# Patient Record
Sex: Female | Born: 1965 | Race: Black or African American | Hispanic: No | Marital: Single | Smoking: Current every day smoker
Health system: Southern US, Community
[De-identification: ages and names within clinical notes are randomized; demographics above are authoritative.]

## PROBLEM LIST (undated history)

## (undated) DIAGNOSIS — I10 Essential (primary) hypertension: Secondary | ICD-10-CM

## (undated) DIAGNOSIS — F32A Depression, unspecified: Secondary | ICD-10-CM

## (undated) DIAGNOSIS — I219 Acute myocardial infarction, unspecified: Secondary | ICD-10-CM

## (undated) DIAGNOSIS — G8929 Other chronic pain: Secondary | ICD-10-CM

## (undated) DIAGNOSIS — M549 Dorsalgia, unspecified: Secondary | ICD-10-CM

## (undated) DIAGNOSIS — C170 Malignant neoplasm of duodenum: Secondary | ICD-10-CM

## (undated) DIAGNOSIS — R569 Unspecified convulsions: Secondary | ICD-10-CM

## (undated) DIAGNOSIS — F1010 Alcohol abuse, uncomplicated: Secondary | ICD-10-CM

## (undated) DIAGNOSIS — R51 Headache: Secondary | ICD-10-CM

## (undated) DIAGNOSIS — R519 Headache, unspecified: Secondary | ICD-10-CM

## (undated) DIAGNOSIS — M1990 Unspecified osteoarthritis, unspecified site: Secondary | ICD-10-CM

## (undated) DIAGNOSIS — I639 Cerebral infarction, unspecified: Secondary | ICD-10-CM

## (undated) DIAGNOSIS — F329 Major depressive disorder, single episode, unspecified: Secondary | ICD-10-CM

## (undated) HISTORY — PX: CARDIAC CATHETERIZATION: SHX172

## (undated) HISTORY — DX: Essential (primary) hypertension: I10

## (undated) HISTORY — DX: Major depressive disorder, single episode, unspecified: F32.9

## (undated) HISTORY — DX: Unspecified convulsions: R56.9

## (undated) HISTORY — DX: Depression, unspecified: F32.A

## (undated) HISTORY — PX: COLPOSCOPY VULVA W/ BIOPSY: SUR282

---
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
## 1989-05-27 DIAGNOSIS — I639 Cerebral infarction, unspecified: Secondary | ICD-10-CM

## 1989-05-27 HISTORY — DX: Cerebral infarction, unspecified: I63.9

## 1998-01-01 ENCOUNTER — Encounter: Admission: RE | Admit: 1998-01-01 | Discharge: 1998-01-01 | Payer: Self-pay | Admitting: Internal Medicine

## 1998-01-20 ENCOUNTER — Encounter: Admission: RE | Admit: 1998-01-20 | Discharge: 1998-01-20 | Payer: Self-pay | Admitting: Internal Medicine

## 1998-02-25 ENCOUNTER — Encounter: Admission: RE | Admit: 1998-02-25 | Discharge: 1998-02-25 | Payer: Self-pay | Admitting: Hematology and Oncology

## 1998-05-19 ENCOUNTER — Encounter: Admission: RE | Admit: 1998-05-19 | Discharge: 1998-05-19 | Payer: Self-pay | Admitting: Internal Medicine

## 1998-07-02 ENCOUNTER — Encounter: Admission: RE | Admit: 1998-07-02 | Discharge: 1998-07-02 | Payer: Self-pay | Admitting: Internal Medicine

## 1998-07-02 ENCOUNTER — Ambulatory Visit (HOSPITAL_COMMUNITY): Admission: RE | Admit: 1998-07-02 | Discharge: 1998-07-02 | Payer: Self-pay | Admitting: Internal Medicine

## 1998-07-29 ENCOUNTER — Encounter: Admission: RE | Admit: 1998-07-29 | Discharge: 1998-07-29 | Payer: Self-pay | Admitting: Internal Medicine

## 1998-08-18 ENCOUNTER — Encounter: Admission: RE | Admit: 1998-08-18 | Discharge: 1998-08-18 | Payer: Self-pay | Admitting: Internal Medicine

## 1998-09-30 ENCOUNTER — Encounter: Payer: Self-pay | Admitting: Emergency Medicine

## 1998-09-30 ENCOUNTER — Inpatient Hospital Stay (HOSPITAL_COMMUNITY): Admission: EM | Admit: 1998-09-30 | Discharge: 1998-10-02 | Payer: Self-pay | Admitting: Emergency Medicine

## 1998-11-06 ENCOUNTER — Encounter: Admission: RE | Admit: 1998-11-06 | Discharge: 1998-11-06 | Payer: Self-pay | Admitting: Internal Medicine

## 1999-01-19 ENCOUNTER — Emergency Department (HOSPITAL_COMMUNITY): Admission: EM | Admit: 1999-01-19 | Discharge: 1999-01-19 | Payer: Self-pay | Admitting: Emergency Medicine

## 1999-01-19 ENCOUNTER — Encounter: Admission: RE | Admit: 1999-01-19 | Discharge: 1999-01-19 | Payer: Self-pay | Admitting: Hematology and Oncology

## 1999-01-31 ENCOUNTER — Emergency Department (HOSPITAL_COMMUNITY): Admission: EM | Admit: 1999-01-31 | Discharge: 1999-02-01 | Payer: Self-pay | Admitting: Emergency Medicine

## 1999-02-01 ENCOUNTER — Encounter: Payer: Self-pay | Admitting: Emergency Medicine

## 1999-02-16 ENCOUNTER — Ambulatory Visit (HOSPITAL_COMMUNITY): Admission: RE | Admit: 1999-02-16 | Discharge: 1999-02-16 | Payer: Self-pay | Admitting: *Deleted

## 1999-02-22 ENCOUNTER — Encounter: Admission: RE | Admit: 1999-02-22 | Discharge: 1999-02-22 | Payer: Self-pay | Admitting: Internal Medicine

## 1999-02-25 ENCOUNTER — Ambulatory Visit (HOSPITAL_COMMUNITY): Admission: RE | Admit: 1999-02-25 | Discharge: 1999-02-25 | Payer: Self-pay | Admitting: *Deleted

## 1999-03-29 ENCOUNTER — Encounter: Admission: RE | Admit: 1999-03-29 | Discharge: 1999-03-29 | Payer: Self-pay | Admitting: Internal Medicine

## 1999-04-23 ENCOUNTER — Encounter: Admission: RE | Admit: 1999-04-23 | Discharge: 1999-04-23 | Payer: Self-pay | Admitting: Internal Medicine

## 1999-05-24 ENCOUNTER — Encounter: Admission: RE | Admit: 1999-05-24 | Discharge: 1999-05-24 | Payer: Self-pay | Admitting: Internal Medicine

## 1999-06-21 ENCOUNTER — Ambulatory Visit (HOSPITAL_COMMUNITY): Admission: RE | Admit: 1999-06-21 | Discharge: 1999-06-21 | Payer: Self-pay | Admitting: Orthopedic Surgery

## 1999-06-21 ENCOUNTER — Encounter: Payer: Self-pay | Admitting: Orthopedic Surgery

## 1999-08-23 ENCOUNTER — Encounter: Admission: RE | Admit: 1999-08-23 | Discharge: 1999-08-23 | Payer: Self-pay | Admitting: Internal Medicine

## 1999-08-31 ENCOUNTER — Emergency Department (HOSPITAL_COMMUNITY): Admission: EM | Admit: 1999-08-31 | Discharge: 1999-08-31 | Payer: Self-pay | Admitting: Emergency Medicine

## 1999-09-02 ENCOUNTER — Encounter: Admission: RE | Admit: 1999-09-02 | Discharge: 1999-09-02 | Payer: Self-pay | Admitting: Psychiatry

## 1999-09-09 ENCOUNTER — Encounter: Payer: Self-pay | Admitting: *Deleted

## 1999-09-09 ENCOUNTER — Ambulatory Visit (HOSPITAL_COMMUNITY): Admission: RE | Admit: 1999-09-09 | Discharge: 1999-09-09 | Payer: Self-pay

## 1999-10-18 ENCOUNTER — Ambulatory Visit (HOSPITAL_BASED_OUTPATIENT_CLINIC_OR_DEPARTMENT_OTHER): Admission: RE | Admit: 1999-10-18 | Discharge: 1999-10-18 | Payer: Self-pay | Admitting: Surgery

## 2000-03-30 ENCOUNTER — Encounter: Admission: RE | Admit: 2000-03-30 | Discharge: 2000-03-30 | Payer: Self-pay | Admitting: Hematology and Oncology

## 2000-08-05 ENCOUNTER — Emergency Department (HOSPITAL_COMMUNITY): Admission: EM | Admit: 2000-08-05 | Discharge: 2000-08-05 | Payer: Self-pay | Admitting: Emergency Medicine

## 2000-08-05 ENCOUNTER — Encounter: Payer: Self-pay | Admitting: Emergency Medicine

## 2000-09-01 ENCOUNTER — Encounter: Admission: RE | Admit: 2000-09-01 | Discharge: 2000-09-01 | Payer: Self-pay | Admitting: Internal Medicine

## 2001-04-09 ENCOUNTER — Encounter: Admission: RE | Admit: 2001-04-09 | Discharge: 2001-04-09 | Payer: Self-pay | Admitting: *Deleted

## 2001-10-23 ENCOUNTER — Encounter (INDEPENDENT_AMBULATORY_CARE_PROVIDER_SITE_OTHER): Payer: Self-pay | Admitting: *Deleted

## 2001-10-23 ENCOUNTER — Encounter: Admission: RE | Admit: 2001-10-23 | Discharge: 2001-10-23 | Payer: Self-pay

## 2002-06-24 ENCOUNTER — Encounter: Admission: RE | Admit: 2002-06-24 | Discharge: 2002-06-24 | Payer: Self-pay | Admitting: Internal Medicine

## 2002-06-24 ENCOUNTER — Encounter (INDEPENDENT_AMBULATORY_CARE_PROVIDER_SITE_OTHER): Payer: Self-pay | Admitting: Specialist

## 2002-07-11 ENCOUNTER — Encounter: Admission: RE | Admit: 2002-07-11 | Discharge: 2002-07-11 | Payer: Self-pay | Admitting: Internal Medicine

## 2002-07-16 ENCOUNTER — Encounter: Admission: RE | Admit: 2002-07-16 | Discharge: 2002-07-16 | Payer: Self-pay | Admitting: Internal Medicine

## 2002-07-25 ENCOUNTER — Other Ambulatory Visit: Admission: RE | Admit: 2002-07-25 | Discharge: 2002-07-25 | Payer: Self-pay | Admitting: Family Medicine

## 2002-07-25 ENCOUNTER — Encounter: Admission: RE | Admit: 2002-07-25 | Discharge: 2002-07-25 | Payer: Self-pay | Admitting: Obstetrics and Gynecology

## 2002-07-26 ENCOUNTER — Encounter (INDEPENDENT_AMBULATORY_CARE_PROVIDER_SITE_OTHER): Payer: Self-pay | Admitting: *Deleted

## 2002-08-06 ENCOUNTER — Encounter: Admission: RE | Admit: 2002-08-06 | Discharge: 2002-08-06 | Payer: Self-pay | Admitting: Internal Medicine

## 2002-11-04 ENCOUNTER — Encounter: Admission: RE | Admit: 2002-11-04 | Discharge: 2002-11-04 | Payer: Self-pay | Admitting: Internal Medicine

## 2003-05-16 ENCOUNTER — Encounter: Admission: RE | Admit: 2003-05-16 | Discharge: 2003-05-16 | Payer: Self-pay | Admitting: Internal Medicine

## 2003-06-06 ENCOUNTER — Encounter: Admission: RE | Admit: 2003-06-06 | Discharge: 2003-06-06 | Payer: Self-pay | Admitting: Internal Medicine

## 2003-11-05 ENCOUNTER — Encounter: Admission: RE | Admit: 2003-11-05 | Discharge: 2003-11-05 | Payer: Self-pay | Admitting: Internal Medicine

## 2003-11-10 ENCOUNTER — Encounter: Admission: RE | Admit: 2003-11-10 | Discharge: 2003-11-10 | Payer: Self-pay | Admitting: Internal Medicine

## 2003-11-14 ENCOUNTER — Encounter: Admission: RE | Admit: 2003-11-14 | Discharge: 2003-11-14 | Payer: Self-pay | Admitting: Internal Medicine

## 2004-04-16 ENCOUNTER — Encounter: Admission: RE | Admit: 2004-04-16 | Discharge: 2004-04-16 | Payer: Self-pay | Admitting: Internal Medicine

## 2004-04-16 ENCOUNTER — Encounter (INDEPENDENT_AMBULATORY_CARE_PROVIDER_SITE_OTHER): Payer: Self-pay | Admitting: *Deleted

## 2004-09-07 ENCOUNTER — Encounter (INDEPENDENT_AMBULATORY_CARE_PROVIDER_SITE_OTHER): Payer: Self-pay | Admitting: *Deleted

## 2004-09-07 ENCOUNTER — Ambulatory Visit: Payer: Self-pay | Admitting: Obstetrics & Gynecology

## 2004-09-07 ENCOUNTER — Other Ambulatory Visit: Admission: RE | Admit: 2004-09-07 | Discharge: 2004-09-07 | Payer: Self-pay | Admitting: Obstetrics & Gynecology

## 2004-09-21 ENCOUNTER — Ambulatory Visit: Payer: Self-pay | Admitting: Obstetrics and Gynecology

## 2005-03-01 ENCOUNTER — Ambulatory Visit: Payer: Self-pay | Admitting: Obstetrics and Gynecology

## 2005-03-01 ENCOUNTER — Encounter (INDEPENDENT_AMBULATORY_CARE_PROVIDER_SITE_OTHER): Payer: Self-pay | Admitting: *Deleted

## 2005-06-02 ENCOUNTER — Ambulatory Visit: Payer: Self-pay | Admitting: Internal Medicine

## 2005-06-16 ENCOUNTER — Ambulatory Visit: Payer: Self-pay | Admitting: Internal Medicine

## 2005-06-17 ENCOUNTER — Other Ambulatory Visit: Admission: RE | Admit: 2005-06-17 | Discharge: 2005-06-17 | Payer: Self-pay | Admitting: Family Medicine

## 2005-06-17 ENCOUNTER — Ambulatory Visit: Payer: Self-pay | Admitting: Family Medicine

## 2005-07-15 ENCOUNTER — Ambulatory Visit: Payer: Self-pay | Admitting: *Deleted

## 2005-11-21 ENCOUNTER — Ambulatory Visit: Payer: Self-pay | Admitting: Internal Medicine

## 2006-04-13 ENCOUNTER — Ambulatory Visit: Payer: Self-pay | Admitting: Internal Medicine

## 2006-10-06 DIAGNOSIS — R945 Abnormal results of liver function studies: Secondary | ICD-10-CM

## 2006-10-06 DIAGNOSIS — R8789 Other abnormal findings in specimens from female genital organs: Secondary | ICD-10-CM

## 2006-10-06 DIAGNOSIS — Z9189 Other specified personal risk factors, not elsewhere classified: Secondary | ICD-10-CM

## 2006-10-06 DIAGNOSIS — M545 Low back pain: Secondary | ICD-10-CM

## 2006-10-06 DIAGNOSIS — N6029 Fibroadenosis of unspecified breast: Secondary | ICD-10-CM

## 2006-10-06 DIAGNOSIS — R0789 Other chest pain: Secondary | ICD-10-CM

## 2006-10-06 DIAGNOSIS — G40909 Epilepsy, unspecified, not intractable, without status epilepticus: Secondary | ICD-10-CM

## 2006-10-09 ENCOUNTER — Ambulatory Visit: Payer: Self-pay | Admitting: Hospitalist

## 2006-10-09 ENCOUNTER — Encounter (INDEPENDENT_AMBULATORY_CARE_PROVIDER_SITE_OTHER): Payer: Self-pay | Admitting: Internal Medicine

## 2006-10-09 LAB — CONVERTED CEMR LAB
AST: 33 units/L (ref 0–37)
Albumin: 4.4 g/dL (ref 3.5–5.2)
Basophils Absolute: 0 10*3/uL (ref 0.0–0.1)
Basophils Relative: 0 % (ref 0–1)
CO2: 25 meq/L (ref 19–32)
Eosinophils Relative: 0 % (ref 0–5)
Glucose, Bld: 86 mg/dL (ref 70–99)
Monocytes Relative: 6 % (ref 3–11)
Neutro Abs: 7 10*3/uL (ref 1.7–7.7)
Neutrophils Relative %: 68 % (ref 43–77)
RBC: 4.11 M/uL (ref 3.87–5.11)
RDW: 13.3 % (ref 11.5–14.0)
Sodium: 136 meq/L (ref 135–145)
Total Bilirubin: 0.3 mg/dL (ref 0.3–1.2)
Total Protein: 7.7 g/dL (ref 6.0–8.3)
WBC: 10.3 10*3/uL (ref 4.0–10.5)

## 2006-11-08 ENCOUNTER — Ambulatory Visit: Payer: Self-pay | Admitting: Obstetrics and Gynecology

## 2006-11-08 ENCOUNTER — Encounter: Payer: Self-pay | Admitting: Obstetrics and Gynecology

## 2006-11-08 ENCOUNTER — Telehealth (INDEPENDENT_AMBULATORY_CARE_PROVIDER_SITE_OTHER): Payer: Self-pay | Admitting: Pharmacy Technician

## 2006-11-08 ENCOUNTER — Encounter (INDEPENDENT_AMBULATORY_CARE_PROVIDER_SITE_OTHER): Payer: Self-pay | Admitting: *Deleted

## 2006-11-22 ENCOUNTER — Ambulatory Visit: Payer: Self-pay | Admitting: Obstetrics and Gynecology

## 2007-01-22 ENCOUNTER — Encounter: Payer: Self-pay | Admitting: Internal Medicine

## 2007-02-28 ENCOUNTER — Encounter: Payer: Self-pay | Admitting: Internal Medicine

## 2007-03-28 ENCOUNTER — Ambulatory Visit: Payer: Self-pay | Admitting: Internal Medicine

## 2007-03-28 ENCOUNTER — Encounter: Payer: Self-pay | Admitting: Internal Medicine

## 2007-04-02 ENCOUNTER — Telehealth: Payer: Self-pay | Admitting: *Deleted

## 2007-04-02 LAB — CONVERTED CEMR LAB
ALT: 68 units/L — ABNORMAL HIGH (ref 0–35)
Albumin: 4.4 g/dL (ref 3.5–5.2)
CO2: 23 meq/L (ref 19–32)
Calcium: 9.3 mg/dL (ref 8.4–10.5)
Chloride: 101 meq/L (ref 96–112)
Creatinine, Ser: 0.64 mg/dL (ref 0.40–1.20)
Total Bilirubin: 0.6 mg/dL (ref 0.3–1.2)
Total Protein: 7.7 g/dL (ref 6.0–8.3)

## 2007-04-09 ENCOUNTER — Encounter: Payer: Self-pay | Admitting: Internal Medicine

## 2007-04-09 ENCOUNTER — Ambulatory Visit: Payer: Self-pay | Admitting: Hospitalist

## 2007-04-09 LAB — CONVERTED CEMR LAB
ALT: 45 units/L — ABNORMAL HIGH (ref 0–35)
Albumin: 4.7 g/dL (ref 3.5–5.2)
Alkaline Phosphatase: 177 units/L — ABNORMAL HIGH (ref 39–117)
BUN: 11 mg/dL (ref 6–23)
Creatinine, Ser: 0.68 mg/dL (ref 0.40–1.20)
HCV Ab: NEGATIVE
Hep A IgM: NEGATIVE
Hepatitis B Surface Ag: NEGATIVE

## 2007-04-11 ENCOUNTER — Ambulatory Visit (HOSPITAL_COMMUNITY): Admission: RE | Admit: 2007-04-11 | Discharge: 2007-04-11 | Payer: Self-pay | Admitting: Internal Medicine

## 2007-04-16 ENCOUNTER — Encounter: Payer: Self-pay | Admitting: Internal Medicine

## 2007-04-16 ENCOUNTER — Ambulatory Visit: Payer: Self-pay | Admitting: *Deleted

## 2007-04-16 LAB — CONVERTED CEMR LAB
ALT: 36 units/L — ABNORMAL HIGH (ref 0–35)
AST: 61 units/L — ABNORMAL HIGH (ref 0–37)
Albumin: 4.2 g/dL (ref 3.5–5.2)
Bilirubin, Direct: 0.1 mg/dL (ref 0.0–0.3)
Sed Rate: 8 mm/hr (ref 0–22)
TSH: 0.737 microintl units/mL (ref 0.350–5.50)
Total Protein: 7.1 g/dL (ref 6.0–8.3)

## 2007-04-18 ENCOUNTER — Encounter: Payer: Self-pay | Admitting: Licensed Clinical Social Worker

## 2007-05-04 ENCOUNTER — Ambulatory Visit: Payer: Self-pay | Admitting: Infectious Disease

## 2007-05-04 DIAGNOSIS — R6250 Unspecified lack of expected normal physiological development in childhood: Secondary | ICD-10-CM | POA: Insufficient documentation

## 2007-05-04 DIAGNOSIS — R636 Underweight: Secondary | ICD-10-CM

## 2007-05-07 ENCOUNTER — Encounter: Payer: Self-pay | Admitting: Internal Medicine

## 2007-05-09 ENCOUNTER — Telehealth: Payer: Self-pay | Admitting: *Deleted

## 2007-06-04 ENCOUNTER — Telehealth (INDEPENDENT_AMBULATORY_CARE_PROVIDER_SITE_OTHER): Payer: Self-pay | Admitting: Pharmacy Technician

## 2007-12-07 ENCOUNTER — Encounter: Payer: Self-pay | Admitting: Internal Medicine

## 2008-02-27 ENCOUNTER — Encounter: Payer: Self-pay | Admitting: Internal Medicine

## 2008-05-09 ENCOUNTER — Telehealth: Payer: Self-pay | Admitting: Internal Medicine

## 2008-06-13 ENCOUNTER — Encounter: Payer: Self-pay | Admitting: Internal Medicine

## 2008-07-01 ENCOUNTER — Ambulatory Visit: Payer: Self-pay | Admitting: Internal Medicine

## 2008-07-01 ENCOUNTER — Encounter: Payer: Self-pay | Admitting: Licensed Clinical Social Worker

## 2008-07-01 ENCOUNTER — Encounter: Payer: Self-pay | Admitting: Internal Medicine

## 2008-07-01 LAB — CONVERTED CEMR LAB
Albumin: 4.6 g/dL (ref 3.5–5.2)
Alkaline Phosphatase: 142 units/L — ABNORMAL HIGH (ref 39–117)
Basophils Relative: 0 % (ref 0–1)
Calcium: 9.4 mg/dL (ref 8.4–10.5)
Chloride: 102 meq/L (ref 96–112)
Creatinine, Ser: 0.56 mg/dL (ref 0.40–1.20)
Eosinophils Relative: 0 % (ref 0–5)
Glucose, Bld: 95 mg/dL (ref 70–99)
HCT: 41.4 % (ref 36.0–46.0)
Hemoglobin: 14.2 g/dL (ref 12.0–15.0)
Lymphs Abs: 3.1 10*3/uL (ref 0.7–4.0)
Neutro Abs: 8.6 10*3/uL — ABNORMAL HIGH (ref 1.7–7.7)
Potassium: 4.4 meq/L (ref 3.5–5.3)
RBC: 4.24 M/uL (ref 3.87–5.11)
Sodium: 137 meq/L (ref 135–145)
Total Bilirubin: 0.5 mg/dL (ref 0.3–1.2)

## 2008-07-07 ENCOUNTER — Ambulatory Visit (HOSPITAL_COMMUNITY): Admission: RE | Admit: 2008-07-07 | Discharge: 2008-07-07 | Payer: Self-pay | Admitting: Internal Medicine

## 2008-07-14 ENCOUNTER — Ambulatory Visit: Payer: Self-pay | Admitting: Internal Medicine

## 2008-07-17 ENCOUNTER — Ambulatory Visit (HOSPITAL_COMMUNITY): Admission: RE | Admit: 2008-07-17 | Discharge: 2008-07-17 | Payer: Self-pay | Admitting: Internal Medicine

## 2008-08-18 ENCOUNTER — Ambulatory Visit: Payer: Self-pay | Admitting: Internal Medicine

## 2008-09-01 ENCOUNTER — Telehealth: Payer: Self-pay | Admitting: Licensed Clinical Social Worker

## 2008-09-12 ENCOUNTER — Encounter (INDEPENDENT_AMBULATORY_CARE_PROVIDER_SITE_OTHER): Payer: Self-pay | Admitting: Internal Medicine

## 2008-09-12 ENCOUNTER — Ambulatory Visit: Payer: Self-pay | Admitting: Infectious Diseases

## 2008-09-13 LAB — CONVERTED CEMR LAB
ALT: 17 units/L (ref 0–35)
Alkaline Phosphatase: 129 units/L — ABNORMAL HIGH (ref 39–117)
Basophils Relative: 0 % (ref 0–1)
CO2: 24 meq/L (ref 19–32)
Calcium: 9.9 mg/dL (ref 8.4–10.5)
Carbamazepine Lvl: 7.7 ug/mL (ref 4.0–12.0)
Chloride: 103 meq/L (ref 96–112)
Creatinine, Ser: 0.59 mg/dL (ref 0.40–1.20)
Glucose, Bld: 91 mg/dL (ref 70–99)
MCHC: 33.3 g/dL (ref 30.0–36.0)
MCV: 97.5 fL (ref 78.0–100.0)
Monocytes Absolute: 0.7 10*3/uL (ref 0.1–1.0)
Neutrophils Relative %: 69 % (ref 43–77)
Total Bilirubin: 0.3 mg/dL (ref 0.3–1.2)

## 2008-10-27 ENCOUNTER — Encounter: Payer: Self-pay | Admitting: Licensed Clinical Social Worker

## 2008-11-03 ENCOUNTER — Encounter: Payer: Self-pay | Admitting: Internal Medicine

## 2008-11-13 ENCOUNTER — Ambulatory Visit: Payer: Self-pay | Admitting: Internal Medicine

## 2008-11-13 DIAGNOSIS — J018 Other acute sinusitis: Secondary | ICD-10-CM

## 2008-12-16 ENCOUNTER — Telehealth: Payer: Self-pay | Admitting: Internal Medicine

## 2009-03-12 ENCOUNTER — Ambulatory Visit: Payer: Self-pay | Admitting: Internal Medicine

## 2009-05-07 ENCOUNTER — Ambulatory Visit: Payer: Self-pay | Admitting: Internal Medicine

## 2009-05-15 ENCOUNTER — Ambulatory Visit (HOSPITAL_COMMUNITY): Admission: RE | Admit: 2009-05-15 | Discharge: 2009-05-15 | Payer: Self-pay | Admitting: Internal Medicine

## 2009-11-19 ENCOUNTER — Ambulatory Visit: Payer: Self-pay | Admitting: Internal Medicine

## 2009-11-19 DIAGNOSIS — F17200 Nicotine dependence, unspecified, uncomplicated: Secondary | ICD-10-CM

## 2010-01-21 ENCOUNTER — Telehealth: Payer: Self-pay | Admitting: Internal Medicine

## 2010-01-21 ENCOUNTER — Ambulatory Visit: Payer: Self-pay | Admitting: Internal Medicine

## 2010-01-21 ENCOUNTER — Ambulatory Visit (HOSPITAL_COMMUNITY): Admission: RE | Admit: 2010-01-21 | Discharge: 2010-01-21 | Payer: Self-pay | Admitting: Internal Medicine

## 2010-01-21 DIAGNOSIS — K047 Periapical abscess without sinus: Secondary | ICD-10-CM

## 2010-02-19 ENCOUNTER — Telehealth: Payer: Self-pay | Admitting: Internal Medicine

## 2010-02-22 ENCOUNTER — Encounter: Payer: Self-pay | Admitting: Internal Medicine

## 2010-02-22 ENCOUNTER — Ambulatory Visit: Payer: Self-pay | Admitting: Internal Medicine

## 2010-02-22 ENCOUNTER — Inpatient Hospital Stay (HOSPITAL_COMMUNITY): Admission: EM | Admit: 2010-02-22 | Discharge: 2010-02-23 | Payer: Self-pay | Admitting: Emergency Medicine

## 2010-02-23 ENCOUNTER — Encounter: Payer: Self-pay | Admitting: Internal Medicine

## 2010-04-06 ENCOUNTER — Ambulatory Visit: Payer: Self-pay | Admitting: Internal Medicine

## 2010-04-06 LAB — CONVERTED CEMR LAB: Pap Smear: NEGATIVE

## 2010-04-07 ENCOUNTER — Encounter: Payer: Self-pay | Admitting: Internal Medicine

## 2010-04-12 DIAGNOSIS — A5901 Trichomonal vulvovaginitis: Secondary | ICD-10-CM

## 2010-04-15 ENCOUNTER — Telehealth: Payer: Self-pay | Admitting: Internal Medicine

## 2010-04-20 LAB — CONVERTED CEMR LAB
ALT: 14 units/L (ref 0–35)
AST: 25 units/L (ref 0–37)
Albumin: 4.6 g/dL (ref 3.5–5.2)
Basophils Absolute: 0 10*3/uL (ref 0.0–0.1)
Chloride: 101 meq/L (ref 96–112)
Creatinine, Ser: 0.73 mg/dL (ref 0.40–1.20)
Eosinophils Absolute: 0 10*3/uL (ref 0.0–0.7)
Eosinophils Relative: 0 % (ref 0–5)
GC Probe Amp, Genital: NEGATIVE
Hemoglobin: 14.6 g/dL (ref 12.0–15.0)
Lymphs Abs: 3.2 10*3/uL (ref 0.7–4.0)
MCV: 96 fL (ref >=78.0)
Monocytes Absolute: 0.5 10*3/uL (ref 0.1–1.0)
Potassium: 3.9 meq/L (ref 3.5–5.3)
RDW: 15.1 % (ref 11.5–15.5)
Total Protein: 7.7 g/dL (ref 6.0–8.3)

## 2010-05-17 ENCOUNTER — Ambulatory Visit (HOSPITAL_COMMUNITY): Admission: RE | Admit: 2010-05-17 | Discharge: 2010-05-17 | Payer: Self-pay | Admitting: Internal Medicine

## 2010-10-28 NOTE — Assessment & Plan Note (Signed)
Summary: EST/F/U VISIT FOR PAP SMEAR/CH   Vital Signs:  Patient profile:   45 year old female Height:      61 inches (154.94 cm) Weight:      92.05 pounds (41.84 kg) BMI:     17.46 Temp:     98.8 degrees F (37.11 degrees C) oral Pulse rate:   66 / minute BP sitting:   122 / 75  (right arm)  Vitals Entered By: Angelina Ok RN (April 06, 2010 11:01 AM) CC: Depression Is Patient Diabetic? No Pain Assessment Patient in pain? yes     Location: legs, back, foot Intensity: 4 Type: aching Onset of pain  Constant Nutritional Status BMI of < 19 = underweight Comments Pt had a glucose test done by the Congegational Nurse reading was 146.  Pt had eaten prior to testing.  Needs a Pap Smear.  Was in the hospital 1 month ago given meds have not taken until she talks with Dr. Phillips Odor.  Needs refills on meds.   Primary Care Provider:  Phillips Odor  CC:  Depression.  History of Present Illness: Shelia Arnold comes in today for routine gyn care. No Complaints. Occasional vaginal discharge. Irregular periods, but normal flow. Sexually active with one partner. Has a hx of abnormal PAP. No reported hx of STD.    Depression History:      The patient is having a depressed mood most of the day but denies diminished interest in her usual daily activities.        The patient denies that she feels like life is not worth living, denies that she wishes that she were dead, and denies that she has thought about ending her life.        Comments:  Worried about her sister.   Preventive Screening-Counseling & Management  Alcohol-Tobacco     Alcohol type: BEER  / AT TIMES     Smoking Status: current     Smoking Cessation Counseling: yes     Packs/Day: 0.5     Year Started: trying to quit  Current Medications (verified): 1)  Prenatal/folic Acid  Tabs (Prenatal Vit-Fe Fumarate-Fa) .... Take 1 Tablet By Mouth Once A Day 2)  Keppra 750 Mg Tabs (Levetiracetam) .... Take 1 Tablet By Mouth Two Times A Day 3)  Voltaren  1 % Gel (Diclofenac Sodium) .... Apply Two Times A Day As Directed For Pain in Legs and Back  Allergies (verified): No Known Drug Allergies  Family History: Reviewed history from 05/04/2007 and no changes required. Mother- dec. - died during childbirth, birth trauma Brother- epilepsy- dec- 87's Father- unknown No known hx of cancer in 1st degree family members.  Review of Systems      See HPI  Physical Exam  General:  Thin,in no acute distress; alert,appropriate and cooperative throughout examination Head:  normocephalic and atraumatic.   Neck:  supple, full ROM, and no masses.   Breasts:  skin/areolae normal and no masses.   Lungs:  normal respiratory effort and normal breath sounds.   Heart:  normal rate and regular rhythm.   Abdomen:  soft and non-tender.   Genitalia:  Normal introitus for age, no external lesions, copious thin white vaginal discharge, mucosa pink and moist, no vaginal or cervical lesions, no vaginal atrophy, no friaility or hemorrhage, normal uterus size and position, no adnexal masses or tenderness Msk:  normal ROM and no joint tenderness.   Extremities:  No clubbing, cyanosis, edema, or deformity noted with normal full range of motion  of all joints.   Neurologic:  alert & oriented X3, cranial nerves II-XII intact, and strength normal in all extremities.   Skin:  color normal and no rashes.   Psych:  Oriented X3, memory intact for recent and remote, and normally interactive.     Impression & Recommendations:  Problem # 1:  Preventive Health Care (ICD-V70.0) Pap/GYN care done today.  Problem # 2:  SEIZURE DISORDER (ICD-780.39) Will maintain current medication regimen. No recent seizure activity.  Her updated medication list for this problem includes:    Keppra 750 Mg Tabs (Levetiracetam) .Marland Kitchen... Take 1 tablet by mouth two times a day  Orders: T-Chlamydia (17510) T-GC Probe, genital [phone]) T-CMP with Estimated GFR ([address]) T-CBC w/Diff  ([id number]) T-Wet Prep by Molecular Probe [phone]) T-Vitamin B12 [phone])  Complete Medication List: 1)  Prenatal/folic Acid Tabs (Prenatal vit-fe fumarate-fa) .... Take 1 tablet by mouth once a day 2)  Keppra 750 Mg Tabs (Levetiracetam) .... Take 1 tablet by mouth two times a day 3)  Voltaren 1 % Gel (Diclofenac sodium) .... Apply two times a day as directed for pain in legs and back 4)  Metronidazole 500 Mg Tabs (Metronidazole) .... Take 4 tablets by mouth times one dose  Other Orders: T-PAP [facility]) [phone])  Patient Instructions: 1)  Please schedule a follow-up appointment in 6 months.   Vital Signs:  Patient profile:   45 year old female Height:      61 inches (154.94 cm) Weight:      92.05 pounds (41.84 kg) BMI:     17.46 Temp:     98.8 degrees F (37.11 degrees C) oral Pulse rate:   66 / minute BP sitting:   122 / 75  (right arm)  Vitals Entered By: Angelina Ok RN (April 06, 2010 11:01 AM)   Prevention & Chronic Care Immunizations   Influenza vaccine: Fluvax Non-MCR  (07/14/2008)    Tetanus booster: Not documented    Pneumococcal vaccine: Not documented  Other Screening   Pap smear: Not documented    Mammogram: ASSESSMENT: Negative - BI-RADS 1^MM DIGITAL SCREENING  (05/15/2009)   Smoking status: current  (04/06/2010)   Smoking cessation counseling: yes  (04/06/2010)  Lipids   Total Cholesterol: Not documented   LDL: Not documented   LDL Direct: Not documented   HDL: Not documented   Triglycerides: Not documented   Process Orders Check Orders Results:     [facility]: ABN not required for this insurance Tests Sent for requisitioning (April 12, 2010 11:10 AM):     04/06/2010: [facility] -- T-Chlamydia [[id number]] (signed)     04/06/2010: [facility] -- T-GC Probe, genital [phone] (signed)     04/06/2010: [facility] -- T-CMP with Estimated GFR [[id number]] (signed)      04/06/2010: [facility] -- T-CBC w/Diff [[id number]] (signed)     04/06/2010: [facility] -- T-Wet Prep by Molecular Probe [[id number]] (signed)     04/06/2010: [facility] -- T-Vitamin B12 [[id number]] (signed)

## 2010-10-28 NOTE — Progress Notes (Signed)
Summary: refill/ hla  Phone Note Refill Request Message from:  Patient on April 15, 2010 12:14 PM  Refills Requested: Medication #1:  VOLTAREN 1 % GEL APply two times a day as directed for pain in legs and back.   Dosage confirmed as above?Dosage Confirmed Initial call taken by: Marin Roberts RN,  April 15, 2010 12:14 PM  Follow-up for Phone Call        Refill approved-nurse to complete Follow-up by: Julaine Fusi  DO,  April 20, 2010 3:32 PM    Prescriptions: VOLTAREN 1 % GEL (DICLOFENAC SODIUM) APply two times a day as directed for pain in legs and back  #1 tube x 11   Entered and Authorized by:   Julaine Fusi  DO   Signed by:   Julaine Fusi  DO on 04/20/2010   Method used:   Electronically to        [facility] [id number] (retail)       [address] [place], [place]  [address]       Ph: [phone]       Fax: [phone]   RxID:   [id number]

## 2010-10-28 NOTE — Progress Notes (Signed)
Summary: Antibiotic  Phone Note Outgoing Call   Call placed by: Angelina Ok RN,  January 21, 2010 2:29 PM Call placed to: Patient Summary of Call: Call to pt informed of results of X ray and the need to pick up and to take the antibiotic ordered.  Pt voiced understanding of results and the plan fro her to get and to take her antibiotic. Angelina Ok RN  January 21, 2010 2:31 PM  Initial call taken by: Angelina Ok RN,  January 21, 2010 2:31 PM  Follow-up for Phone Call        thanks! Follow-up by: Julaine Fusi  DO,  Jan 25, 2010 2:23 PM

## 2010-10-28 NOTE — Progress Notes (Signed)
Summary: med refill/gp  Phone Note Refill Request Message from:  Patient on Feb 19, 2010 10:36 AM  Refills Requested: Medication #1:  FOLIC ACID 1 MG TABS Take four tablets by mouth every day.  Method Requested: Electronic Initial call taken by: Chinita Pester RN,  Feb 19, 2010 10:36 AM  Follow-up for Phone Call        will change script Follow-up by: Julaine Fusi  DO,  February 25, 2010 2:32 PM    New/Updated Medications: PRENATAL/FOLIC ACID  TABS (PRENATAL VIT-FE FUMARATE-FA) Take 1 tablet by mouth once a day Prescriptions: PRENATAL/FOLIC ACID  TABS (PRENATAL VIT-FE FUMARATE-FA) Take 1 tablet by mouth once a day  #30 x 6   Entered and Authorized by:   Julaine Fusi  DO   Signed by:   Julaine Fusi  DO on 03/02/2010   Method used:   Electronically to        [facility] [id number] (retail)       [address] [place], [place]  [address]       Ph: [phone]       Fax: [phone]   RxID:   [id number]

## 2010-10-28 NOTE — Assessment & Plan Note (Signed)
Summary: f/u [mkj]   Vital Signs:  Patient profile:   45 year old female Height:      61 inches (154.94 cm) Weight:      92.02 pounds (41.83 kg) BMI:     17.45 Temp:     98.9 degrees F (37.17 degrees C) oral Pulse rate:   71 / minute BP sitting:   116 / 77  (right arm)  Vitals Entered By: Blenda Mounts (November 19, 2009 11:36 AM)/ Angelina Ok, RN November 19, 2009 11:36 PM CC: check up, Preventive Care Is Patient Diabetic? No Pain Assessment Patient in pain? yes     Location: leg Intensity: 10 Type: sharp Onset of pain  Intermittent Nutritional Status BMI of < 19 = underweight  Have you ever been in a relationship where you felt threatened, hurt or afraid?No   Does patient need assistance? Functional Status Self care Ambulation Normal   Primary Care Provider:  Phillips Odor  CC:  check up and Preventive Care.  History of Present Illness: Shelia Arnold comes in today for routine follow-up. She is doing well on teh Keppra, but has had at least 2 break through seizures during teh night when she wakes up with her tounge bleeding. No known side effects from teh meds. She had abnormal LFTs while on Dilantin.  Depression History:      The patient denies a depressed mood most of the day and a diminished interest in her usual daily activities.        The patient denies that she feels like life is not worth living, denies that she wishes that she were dead, and denies that she has thought about ending her life.         Preventive Screening-Counseling & Management  Alcohol-Tobacco     Alcohol type: BEER  / AT TIMES     Smoking Status: current     Smoking Cessation Counseling: yes     Packs/Day: 1/2-1     Year Started: trying to quit  Caffeine-Diet-Exercise     Does Patient Exercise: yes     Type of exercise: walking  Current Medications (verified): 1)  Folic Acid 1 Mg Tabs (Folic Acid) .... Take Four Tablets By Mouth Every Day. 2)  Keppra 750 Mg Tabs (Levetiracetam) ....  Take 1 Tablet By Mouth Two Times A Day 3)  Ortho Tri-Cyclen Lo  Tabs (Norgestimate-Ethinyl Estradiol Tabs) .... Take 1 Tablet By Mouth Once A Day 4)  Voltaren 1 % Gel (Diclofenac Sodium) .... Apply Two Times A Day As Directed For Pain in Legs and Back 5)  Naproxen 500 Mg Tabs (Naproxen) .... Take 1 Tablet By Mouth Two Times A Day  Allergies (verified): No Known Drug Allergies  Social History: Does Patient Exercise:  yes  Review of Systems       The patient complains of weight loss.  The patient denies anorexia, fever, vision loss, decreased hearing, chest pain, syncope, dyspnea on exertion, peripheral edema, prolonged cough, headaches, abdominal pain, melena, hematochezia, and severe indigestion/heartburn.    Physical Exam  General:  underweight appearing.   Lungs:  normal respiratory effort and normal breath sounds.   Heart:  normal rate, regular rhythm, and no murmur.   Abdomen:  soft, non-tender, and normal bowel sounds.   Msk:  normal ROM.  Mild TTP over SI joints bilaterally, no radiating pain on exam. muscle spasm L-Spine. Neurologic:  alert & oriented X3, cranial nerves II-XII intact, strength normal in all extremities, and sensation intact to light touch.  Skin:  no rashes.   Psych:  Oriented X3, normally interactive, and good eye contact.     Impression & Recommendations:  Problem # 1:  Preventive Health Care (ICD-V70.0) Needs to be scheduled for PAP at nexty visit.  Problem # 2:  SEIZURE DISORDER (ICD-780.39) Have increased her Keppra to 750mg  two times a day will follow up in 1-2 months and see how she is doing on teh new dose of medication. Her updated medication list for this problem includes:    Keppra 750 Mg Tabs (Levetiracetam) .Marland Kitchen... Take 1 tablet by mouth two times a day  Problem # 3:  TOBACCO ABUSE (ICD-305.1) Patient was counseled on smoking cessation strategies including medications and behavior modification options.   Problem # 4:  LOW BACK PAIN SYNDROME  (ICD-724.2) Will start with NSAIDS.   Her updated medication list for this problem includes:    Naproxen 500 Mg Tabs (Naproxen) .Marland Kitchen... Take 1 tablet by mouth two times a day  Complete Medication List: 1)  Folic Acid 1 Mg Tabs (Folic acid) .... Take four tablets by mouth every day. 2)  Keppra 750 Mg Tabs (Levetiracetam) .... Take 1 tablet by mouth two times a day 3)  Ortho Tri-cyclen Lo Tabs (Norgestimate-ethinyl estradiol tabs) .... Take 1 tablet by mouth once a day 4)  Voltaren 1 % Gel (Diclofenac sodium) .... Apply two times a day as directed for pain in legs and back 5)  Naproxen 500 Mg Tabs (Naproxen) .... Take 1 tablet by mouth two times a day  Mammogram Screening:    Last Mammogram:  05/15/2009  Osteoporosis Risk Assessment:  Risk Factors for Fracture or Low Bone Density:   Smoking status:       current  Immunization & Chemoprophylaxis:    Influenza vaccine: Fluvax Non-MCR  (07/14/2008)  Patient Instructions: 1)  Please schedule a follow-up appointment in 2 months for PAP with Phillips Odor. Prescriptions: NAPROXEN 500 MG TABS (NAPROXEN) Take 1 tablet by mouth two times a day  #60 x 3   Entered and Authorized by:   Julaine Fusi  DO   Signed by:   Julaine Fusi  DO on 12/14/2009   Method used:   Electronically to        [facility] (retail)       [address]       [place], [place]  [address]       Ph: [phone]       Fax: [phone]   RxID:   [id number] VOLTAREN 1 % GEL (DICLOFENAC SODIUM) APply two times a day as directed for pain in legs and back  #1 tube x 3   Entered and Authorized by:   Julaine Fusi  DO   Signed by:   Julaine Fusi  DO on 12/14/2009   Method used:   Electronically to        [facility] (retail)       [address]       [place], [place]  [address]       Ph: [phone]       Fax: [phone]   RxID:   [id number] KEPPRA 750 MG TABS (LEVETIRACETAM) Take 1 tablet by mouth two  times a day  #60 x 2   Entered and Authorized by:   Julaine Fusi  DO   Signed by:   Julaine Fusi  DO on  12/14/2009   Method used:   Electronically to        [facility] (retail)       [address]       [place], [place]  [address]       Ph: [phone]       Fax: [phone]   RxID:   [id number]   Prevention & Chronic Care Immunizations   Influenza vaccine: Fluvax Non-MCR  (07/14/2008)    Tetanus booster: Not documented    Pneumococcal vaccine: Not documented  Other Screening   Pap smear: Not documented    Mammogram: ASSESSMENT: Negative - BI-RADS 1^MM DIGITAL SCREENING  (05/15/2009)   Smoking status: current  (11/19/2009)   Smoking cessation counseling: yes  (11/19/2009)  Lipids   Total Cholesterol: Not documented   LDL: Not documented   LDL Direct: Not documented   HDL: Not documented   Triglycerides: Not documented

## 2010-10-28 NOTE — Initial Assessments (Signed)
INTERNAL MEDICINE ADMISSION HISTORY AND PHYSICAL  PCP: Dr. Phillips Odor  1st contact Dr Gilford Rile [phone] 2nd contact Dr Sherryll Burger  [phone] Holidays or 5pm on weekdays:  1st contact [phone] 2nd contact [phone]  CC: seizures  HPI: patient is a 45 year old female with PMH significant for seizure disorder, presents to the ED with a chief complaint of a seizure.  Pt was with her friend and reportedly either had seizure this morning or just found unarousable.  EMS was called who reports pt was combative and possibly post ictal on their exam.  Pt was sleeping in the ED initially although responded some with tactile stimulation and voice, then had another seizure episode and was given 1MG  of ativan.  Per EMR records, patient had ranout of her Keppra about 8 days ago and sent it a refill request to her PCP on friday 5/27 which has not yet been filled. On my encounter with the patient, she was unarousable therefore the rest of HPI was unobtainable.   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 Normal EEG (10/09). Normal MRI (10/09). Awaiting sleep study. 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  Developmental delay Abnml LFT, secondary to dilantin/alcohol Atypical chest pain Colposcopy for abnml pap smear Low back pain Fibroadenosis of breast    MEDICATIONS: FOLIC ACID 1 MG TABS (FOLIC ACID) Take four tablets by mouth every day. KEPPRA 750 MG TABS (LEVETIRACETAM) Take 1 tablet by mouth two times a day ORTHO TRI-CYCLEN LO  TABS (NORGESTIMATE-ETHINYL ESTRADIOL TABS) Take 1 tablet by mouth once a day VOLTAREN 1 % GEL (DICLOFENAC SODIUM) APply two times a day as directed for pain in legs and back NAPROXEN 500 MG TABS (NAPROXEN) Take 1 tablet by mouth two times a day CLEOCIN 150 MG CAPS (CLINDAMYCIN HCL) Take 1 capsule by mouth two times a day   SOCIAL HISTORY: Single Current Smoker Alcohol use-yes-  historical Drug use-no Regular exercise-no Completed 7th grade  FAMILY HISTORY: Mother- dec. - died during childbirth, birth trauma Brother- epilepsy- dec- 5's Father- unknown  ROS: As per HPI, all other systems reviewed and negative  VITALS: T: 100.1 (rectal) P: 89  BP: 100/67  R: 19  O2SAT: 95% on RA.  PHYSICAL EXAM: General:  somnulent, in no acute distress.   Head:  normocephalic and atraumatic.   Eyes:  pupils equal, pupils round, pupils reactive to light, no injection and anicteric.   Mouth:  pharynx pink and moist. Neck:  supple, full ROM, no thyromegaly, no JVD, and no carotid bruits.   Lungs:  normal respiratory effort, no accessory muscle use, Mild bilbasilar rales.  Heart:  normal rate, regular rhythm, no murmur, no gallop, and no rub.   Abdomen:  soft, non-tender, normal bowel sounds, no distention, no guarding, no rebound tenderness, no hepatomegaly, and no splenomegaly.   Msk:  no joint swelling, no joint warmth, and no redness over joints.   Pulses:  2+ DP/PT pulses bilaterally Extremities:  No cyanosis, clubbing, edema  Neurologic:  somnulent, patient non compliant with neuro exam Skin:  turgor normal and no rashes.   Psych: unable to assess  LABS:  Sodium (NA)                              139               135-145  mEq/L  Potassium (K)                            4.2               3.5-5.1          mEq/L  Chloride                                 108               96-112           mEq/L  CO2                                      21                19-32            mEq/L  Glucose                                  137        h      70-99            mg/dL  BUN                                      12                6-23             mg/dL  Creatinine                               0.80              0.4-1.2          mg/dL  GFR, Est Non African American            >60               >60              mL/min  GFR, Est African American                >60                >60              mL/min    Oversized comment, see footnote  1  Calcium                                  9.3               8.4-10.5         mg/dL   WBC                                      23.3       h      4.0-10.5  K/uL  RBC                                      4.46              3.87-5.11        MIL/uL  Hemoglobin (HGB)                         15.1       h      12.0-15.0        g/dL  Hematocrit (HCT)                         44.5              36.0-46.0        %  MCV                                      99.9              78.0-100.0       fL  MCHC                                     33.9              30.0-36.0        g/dL  RDW                                      14.9              11.5-15.5        %  Platelet Count (PLT)                     201               150-400          K/uL  Neutrophils, %                           95         h      43-77            %  Lymphocytes, %                           2          l      12-46            %  Monocytes, %                             3                 3-12             %  Eosinophils, %  0                 0-5              %  Basophils, %                             0                 0-1              %  Neutrophils, Absolute                    22.1       h      1.7-7.7          K/uL  Lymphocytes, Absolute                    0.4        l      0.7-4.0          K/uL  Monocytes, Absolute                      0.7               0.1-1.0          K/uL  Eosinophils, Absolute                    0.0               0.0-0.7          K/uL  Basophils, Absolute                      0.0               0.0-0.1          K/uL  Orthopanogram IMPRESSION (01/21/2010):  Possible dental abscess at the root of the lower right second molar.    ASSESSMENT AND PLAN:  Seizures: Patient has been out of her seizure meds since 5/22, which is the most likely reason for the seizure activity, infection could have also provoked the seizure.  Plan: Give  Keppra and Ativan as needed.  AMS: Post ictal vs infection, blood cultures drawn and pending, IV Zosyn to be given until infection can be rouledout.   Leukocytosis: Patient has a WBC of 23.3 this may be 2/2 to seizure activity vs unresolved infection from dental absess that the patient has had since 12/2009. Plan: -Blood clutures x2 -IV zosyn -CXR -Orthopanogram  Hyperglycemia: CBG's elevated on admission, No history of DM, Will Check HgA1c, will not cover with SSI for now and will consider starting if CBG are significantly more elevated.    VTE Proph: Lovenox  ATTENDING: I performed and/or observed a history and physical examination of the patient.  I discussed the case with the residents as noted and reviewed the residents' notes.  I agree with the findings and plan--please refer to the attending physician note for more details.  Signature________________________________  Printed Name_____________________________

## 2010-10-28 NOTE — Assessment & Plan Note (Signed)
Summary: 49month f/u/pap smear/vs   Vital Signs:  Patient profile:   45 year old female Height:      61 inches (154.94 cm) Weight:      92.01 pounds (41.82 kg) BMI:     17.45 Temp:     97.1 degrees F (36.17 degrees C) oral Pulse rate:   76 / minute BP sitting:   130 / 82  (right arm)  Vitals Entered By: Angelina Ok RN (January 21, 2010 10:47 AM) CC: Depression Is Patient Diabetic? No Pain Assessment Patient in pain? yes     Location: jaw Intensity: 3 Type: aching Onset of pain  Constant Nutritional Status BMI of < 19 = underweight  Have you ever been in a relationship where you felt threatened, hurt or afraid?No   Does patient need assistance? Functional Status Self care Ambulation Normal Comments Swelling in face with some pain.  Saw a Dentist  Given Antibiotic and pain meds.  Encouraged to take the Antibiotic and the Ibuproben.  Has not been taking the Naproxen.  Took it  3 times a day as ordered.  Not taking.   Primary Care Provider:  Phillips Odor  CC:  Depression.  History of Present Illness: Shelia Arnold comes in today for routine follow-up and complains of severe tooth/dental pain. She saw a dentist but is unable to tell me who it was or what the problems was with her tooth. She has two unfilled prescriptions by a dentist -one for Amoxicillin and one for tylenol #3. She cannot tell me when her follow-up appointment is with teh dentist.   Depression History:      The patient denies a depressed mood most of the day and a diminished interest in her usual daily activities.        The patient denies that she feels like life is not worth living, denies that she wishes that she were dead, and denies that she has thought about ending her life.         Preventive Screening-Counseling & Management  Alcohol-Tobacco     Alcohol type: BEER  / AT TIMES     Smoking Status: current     Smoking Cessation Counseling: yes     Packs/Day: 0.5     Year Started: trying to quit  Current  Medications (verified): 1)  Folic Acid 1 Mg Tabs (Folic Acid) .... Take Four Tablets By Mouth Every Day. 2)  Keppra 750 Mg Tabs (Levetiracetam) .... Take 1 Tablet By Mouth Two Times A Day 3)  Ortho Tri-Cyclen Lo  Tabs (Norgestimate-Ethinyl Estradiol Tabs) .... Take 1 Tablet By Mouth Once A Day 4)  Voltaren 1 % Gel (Diclofenac Sodium) .... Apply Two Times A Day As Directed For Pain in Legs and Back 5)  Naproxen 500 Mg Tabs (Naproxen) .... Take 1 Tablet By Mouth Two Times A Day 6)  Cleocin 150 Mg Caps (Clindamycin Hcl) .... 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 Normal EEG (10/09). Normal MRI (10/09). Awaiting sleep study. 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  Developmental delay Abnml LFT, secondary to dilantin/alcohol Atypical chest pain Colposcopy for abnml pap smear Low back pain Fibroadenosis of breast   Social History: Packs/Day:  0.5  Review of Systems      See HPI  Physical Exam  General:  underweight appearing.   Mouth:  Slight swelling of right lower jaw. lower right back molar is severly decayed, gums are red and inflammed, multiple cracked teeth, very poor dention, tender to papl of mandible, no redness or mass palpated-gneralized.  Lungs:  normal respiratory effort and normal breath sounds.   Heart:  normal rate, regular rhythm, and no murmur.   Abdomen:  soft, non-tender, and normal bowel sounds.   Msk:  normal ROM and no joint tenderness.   Neurologic:  alert & oriented X3, strength normal in all extremities, gait normal, and DTRs symmetrical and normal.   Skin:  no rashes.   Psych:  Oriented X3, memory intact for recent and remote, and normally interactive.     Impression & Recommendations:  Problem # 1:  ABSCESS, TOOTH (WJX-914.7) I am certain the  right lower molar is infected. Will get films today to make sure there is no serious fluid collection or mandibular involvement. Will put her on Clindamycin for better dental coverage vs. Amxicillin. Will need her to follow-up in 2 weeks. Advised her to fill teh tylenol #3 for pain.  Orders: Diagnostic X-Ray/Fluoroscopy (Diagnostic X-Ray/Flu)  Problem # 2:  LOW BACK PAIN SYNDROME (ICD-724.2) Take Naproxen daily for pain. Her updated medication list for this problem includes:    Naproxen 500 Mg Tabs (Naproxen) .Marland Kitchen... Take 1 tablet by mouth two times a day  Problem # 3:  SEIZURE DISORDER (ICD-780.39) better control on Keppra. No new issues. LFTs normal after stopping Dilantin/tegretol.  Her updated medication list for this problem includes:    Keppra 750 Mg Tabs (Levetiracetam) .Marland Kitchen... Take 1 tablet by mouth two times a day  Complete Medication List: 1)  Folic Acid 1 Mg Tabs (Folic acid) .... Take four tablets by mouth every day. 2)  Keppra 750 Mg Tabs (Levetiracetam) .... Take 1 tablet by mouth two times a day 3)  Ortho Tri-cyclen Lo Tabs (Norgestimate-ethinyl estradiol tabs) .... Take 1 tablet by mouth once a day 4)  Voltaren 1 % Gel (Diclofenac sodium) .... Apply two times a day as directed for pain in legs and back 5)  Naproxen 500 Mg Tabs (Naproxen) .... Take 1 tablet by mouth two times a day 6)  Cleocin 150 Mg Caps (Clindamycin hcl) .... Take 1 capsule by mouth two times a day  Patient Instructions: 1)  F/U 2 weeks for Dental F/U and resoluion of infection- schedule PAP with Phillips Odor next available appt. Prescriptions: CLEOCIN 150 MG CAPS (CLINDAMYCIN HCL) Take 1 capsule by mouth two times a day  #20 x 0   Entered and Authorized by:   Julaine Fusi  DO   Signed by:   Julaine Fusi  DO on 01/21/2010   Method used:   Electronically to        [facility] [id number] (retail)       [address] [place], [place]  [address]       Ph: [phone]       Fax:  [phone]   RxID:   [id number]    Vital Signs:  Patient profile:   45 year old female Height:      61 inches (154.94 cm) Weight:      92.01 pounds (41.82 kg) BMI:     17.45 Temp:     97.1 degrees F (36.17 degrees C) oral Pulse rate:   76 / minute BP sitting:   130 / 82  (right arm)  Vitals Entered By: Angelina Ok RN (January 21, 2010 10:47 AM)   Prevention & Chronic Care Immunizations   Influenza vaccine: Fluvax Non-MCR  (07/14/2008)    Tetanus booster: Not documented    Pneumococcal vaccine: Not documented  Other Screening   Pap smear: Not documented    Mammogram: ASSESSMENT: Negative - BI-RADS 1^MM DIGITAL SCREENING  (05/15/2009)   Smoking status: current  (01/21/2010)   Smoking cessation counseling: yes  (01/21/2010)  Lipids   Total Cholesterol: Not documented   LDL: Not documented   LDL Direct: Not documented   HDL: Not documented   Triglycerides: Not documented

## 2010-10-28 NOTE — Discharge Summary (Signed)
Summary: Hospital Discharge Update    Hospital Discharge Update:  Date of Admission: 02/22/2010 Date of Discharge: 02/23/2010  Brief Summary:  ran out of keppra, and has a seizure, admitted for over night obs. script for keppra given at d/c  Labs needed at follow-up: CBC with differential, Basic metabolic panel  Medication list changes:  Removed medication of CLEOCIN 150 MG CAPS (CLINDAMYCIN HCL) Take 1 capsule by mouth two times a day Rx of KEPPRA 750 MG TABS (LEVETIRACETAM) Take 1 tablet by mouth two times a day;  #60 x 2;  Signed;  Entered by: Darnelle Maffucci MD;  Authorized by: Darnelle Maffucci MD;  Method used: Print then Give to Patient  The medication, problem, and allergy lists have been updated.  Please see the dictated discharge summary for details.  Discharge medications:  FOLIC ACID 1 MG TABS (FOLIC ACID) Take four tablets by mouth every day. KEPPRA 750 MG TABS (LEVETIRACETAM) Take 1 tablet by mouth two times a day ORTHO TRI-CYCLEN LO  TABS (NORGESTIMATE-ETHINYL ESTRADIOL TABS) Take 1 tablet by mouth once a day VOLTAREN 1 % GEL (DICLOFENAC SODIUM) APply two times a day as directed for pain in legs and back NAPROXEN 500 MG TABS (NAPROXEN) Take 1 tablet by mouth two times a day  Other patient instructions:  Please come for an appointment at the outpatient clinic at [facility] on 6/27 at 11:00 am with Dr. Phillips Odor  for a followup visit.  Please take your medication as prescribed below.  If you have any problem, Please call the clinic.   In case of an emergency  dial 911 or go to the emergency department.    Note: Hospital Discharge Medications & Other Instructions handout was printed, one copy for patient and a second copy to be placed in hospital chart.  Prescriptions: KEPPRA 750 MG TABS (LEVETIRACETAM) Take 1 tablet by mouth two times a day  #60 x 2   Entered and Authorized by:   Darnelle Maffucci MD   Signed by:   Darnelle Maffucci MD on 02/23/2010   Method used:    Print then Give to Patient   RxID:   [id number]

## 2010-12-13 LAB — URINALYSIS, ROUTINE W REFLEX MICROSCOPIC
Bilirubin Urine: NEGATIVE
Glucose, UA: NEGATIVE mg/dL
Ketones, ur: NEGATIVE mg/dL
Nitrite: NEGATIVE
Protein, ur: 30 mg/dL — AB
Specific Gravity, Urine: 1.02 (ref 1.005–1.030)
Urobilinogen, UA: 0.2 mg/dL (ref 0.0–1.0)
pH: 5 (ref 5.0–8.0)

## 2010-12-13 LAB — CBC
HCT: 36.6 % (ref 36.0–46.0)
HCT: 44.5 % (ref 36.0–46.0)
Hemoglobin: 12.7 g/dL (ref 12.0–15.0)
Hemoglobin: 15.1 g/dL — ABNORMAL HIGH (ref 12.0–15.0)
MCHC: 33.9 g/dL (ref 30.0–36.0)
MCHC: 34.7 g/dL (ref 30.0–36.0)
MCV: 98.7 fL (ref 78.0–100.0)
MCV: 99.9 fL (ref 78.0–100.0)
Platelets: 201 10*3/uL (ref 150–400)
RBC: 3.71 MIL/uL — ABNORMAL LOW (ref 3.87–5.11)
RBC: 4.46 MIL/uL (ref 3.87–5.11)
RDW: 14.9 % (ref 11.5–15.5)
WBC: 14.8 10*3/uL — ABNORMAL HIGH (ref 4.0–10.5)
WBC: 23.3 K/uL — ABNORMAL HIGH (ref 4.0–10.5)

## 2010-12-13 LAB — RAPID URINE DRUG SCREEN, HOSP PERFORMED
Amphetamines: NOT DETECTED
Barbiturates: NOT DETECTED
Benzodiazepines: NOT DETECTED
Cocaine: NOT DETECTED
Opiates: NOT DETECTED
Tetrahydrocannabinol: POSITIVE — AB

## 2010-12-13 LAB — HEPATIC FUNCTION PANEL
ALT: 18 U/L (ref 0–35)
AST: 37 U/L (ref 0–37)
Albumin: 4.1 g/dL (ref 3.5–5.2)
Alkaline Phosphatase: 71 U/L (ref 39–117)
Bilirubin, Direct: 0.3 mg/dL (ref 0.0–0.3)
Indirect Bilirubin: 0.9 mg/dL (ref 0.3–0.9)
Total Bilirubin: 1.2 mg/dL (ref 0.3–1.2)
Total Protein: 7.6 g/dL (ref 6.0–8.3)

## 2010-12-13 LAB — CULTURE, BLOOD (ROUTINE X 2)
Culture: NO GROWTH
Culture: NO GROWTH

## 2010-12-13 LAB — URINE MICROSCOPIC-ADD ON

## 2010-12-13 LAB — HIV ANTIBODY (ROUTINE TESTING W REFLEX): HIV: NONREACTIVE

## 2010-12-13 LAB — BASIC METABOLIC PANEL
CO2: 21 mEq/L (ref 19–32)
CO2: 23 mEq/L (ref 19–32)
Calcium: 8.8 mg/dL (ref 8.4–10.5)
Chloride: 108 mEq/L (ref 96–112)
Creatinine, Ser: 0.66 mg/dL (ref 0.4–1.2)
GFR calc Af Amer: >60 mL/min (ref >60)
GFR calc non Af Amer: >60 mL/min (ref >60)
Glucose, Bld: 137 mg/dL — ABNORMAL HIGH (ref 70–99)
Glucose, Bld: 98 mg/dL (ref 70–99)
Sodium: 139 mEq/L (ref 135–145)

## 2010-12-13 LAB — BASIC METABOLIC PANEL WITH GFR
BUN: 12 mg/dL (ref 6–23)
Calcium: 9.3 mg/dL (ref 8.4–10.5)
Creatinine, Ser: 0.8 mg/dL (ref 0.4–1.2)
GFR calc non Af Amer: >60 mL/min (ref >60)
Potassium: 4.2 meq/L (ref 3.5–5.1)

## 2010-12-13 LAB — RPR: RPR Ser Ql: NONREACTIVE

## 2010-12-13 LAB — HEMOGLOBIN A1C
Hgb A1c MFr Bld: 5.1 % (ref <5.7)
Mean Plasma Glucose: 100 mg/dL (ref <117)

## 2010-12-13 LAB — DIFFERENTIAL
Basophils Absolute: 0 K/uL (ref 0.0–0.1)
Basophils Relative: 0 % (ref 0–1)
Eosinophils Absolute: 0 10*3/uL (ref 0.0–0.7)
Eosinophils Relative: 0 % (ref 0–5)
Lymphocytes Relative: 2 % — ABNORMAL LOW (ref 12–46)
Lymphs Abs: 0.4 K/uL — ABNORMAL LOW (ref 0.7–4.0)
Monocytes Absolute: 0.7 K/uL (ref 0.1–1.0)
Monocytes Relative: 3 % (ref 3–12)
Neutro Abs: 22.1 K/uL — ABNORMAL HIGH (ref 1.7–7.7)
Neutrophils Relative %: 95 % — ABNORMAL HIGH (ref 43–77)

## 2010-12-13 LAB — TSH: TSH: 0.499 u[IU]/mL (ref 0.350–4.500)

## 2010-12-13 LAB — PREGNANCY, URINE: Preg Test, Ur: NEGATIVE

## 2011-02-02 ENCOUNTER — Ambulatory Visit (INDEPENDENT_AMBULATORY_CARE_PROVIDER_SITE_OTHER): Payer: Self-pay | Admitting: Internal Medicine

## 2011-02-02 ENCOUNTER — Encounter: Payer: Self-pay | Admitting: Internal Medicine

## 2011-02-02 DIAGNOSIS — F341 Dysthymic disorder: Secondary | ICD-10-CM

## 2011-02-02 DIAGNOSIS — M7918 Myalgia, other site: Secondary | ICD-10-CM | POA: Insufficient documentation

## 2011-02-02 DIAGNOSIS — F329 Major depressive disorder, single episode, unspecified: Secondary | ICD-10-CM

## 2011-02-02 DIAGNOSIS — M545 Low back pain: Secondary | ICD-10-CM

## 2011-02-02 DIAGNOSIS — IMO0001 Reserved for inherently not codable concepts without codable children: Secondary | ICD-10-CM

## 2011-02-02 DIAGNOSIS — R569 Unspecified convulsions: Secondary | ICD-10-CM

## 2011-02-02 DIAGNOSIS — F17200 Nicotine dependence, unspecified, uncomplicated: Secondary | ICD-10-CM

## 2011-02-02 MED ORDER — DICLOFENAC EPOLAMINE 1.3 % TD PTCH: 1.0000 | MEDICATED_PATCH | Freq: Two times a day (BID) | TRANSDERMAL | Status: DC

## 2011-02-02 MED ORDER — LEVETIRACETAM 750 MG PO TABS: 750.0000 mg | ORAL_TABLET | Freq: Two times a day (BID) | ORAL | Status: DC

## 2011-02-02 MED ORDER — AMITRIPTYLINE HCL 25 MG PO TABS: 25.0000 mg | ORAL_TABLET | Freq: Every day | ORAL | Status: DC

## 2011-02-02 NOTE — Progress Notes (Signed)
Subjective:    Patient ID: Shelia Arnold, female    DOB: [birth year], 45 y.o.   MRN: [id number]  HPI  Shelia Arnold is a 45 year old female who presents today complaining of leg and back pain.  She has a history of this going back a year.  She states that the pain is in both legs and her lower back.  The pain is a constant, aching pain that occasionally is sharp.  She denies any numbness, tingling, weakness, bowel or bladder incontinence, or perineal numbness.  She has no history of trauma to the area and has not had any episodes where she was lifting or feeling something pop.  She does state that she hasn't been sleeping well lately and is having conflicts with her current live in boyfriend.  She is actively looking for somewhere else to live.  She denies any abuse and states that she feels safe in her home.  She does not have an escape plan in place and does not know where she would go if she needed to leave.  She states that her mood has been irritable because of the lack of sleep and the conflict in her life but denies any SI/HI, decreased concentration, overeating, not eating, and no problems doing the things she wants to or needs to do.  Review of Systems    Constitutional: Postive for fatigue Denies fever, chills, diaphoresis, appetite change.  HEENT: Denies photophobia, eye pain, redness, hearing loss, ear pain, congestion, sore throat, rhinorrhea, sneezing, mouth sores, trouble swallowing, neck pain, neck stiffness and tinnitus.   Respiratory: Denies SOB, DOE, cough, chest tightness,  and wheezing.   Cardiovascular: Denies chest pain, palpitations and leg swelling.  Gastrointestinal: Denies nausea, vomiting, abdominal pain, diarrhea, constipation, blood in stool and abdominal distention.  Genitourinary: Denies dysuria, urgency, frequency, hematuria, flank pain and difficulty urinating.  Musculoskeletal: Denies myalgias, back pain, joint swelling, arthralgias and gait problem.  Skin:  Denies pallor, rash and wound.  Neurological: Denies dizziness, seizures, syncope, weakness, light-headedness, numbness and headaches.  Hematological: Denies adenopathy. Easy bruising, personal or family bleeding history  Psychiatric/Behavioral: Denies suicidal ideation, mood changes, confusion, nervousness, sleep disturbance and agitation  Objective:   Physical Exam    Constitutional: Vital signs reviewed.  Patient is a well-developed and thin woman in mild distress from pain and cooperative with exam. Alert and oriented x3.  Head: Normocephalic and atraumatic Ear: TM normal bilaterally Mouth: no erythema or exudates, MMM Eyes: PERRL, EOMI, conjunctivae normal, No scleral icterus.  Neck: Supple, Trachea midline normal ROM, No JVD, mass, thyromegaly, or carotid bruit present.  Cardiovascular: RRR, S1 normal, S2 normal, no MRG, pulses symmetric and intact bilaterally Pulmonary/Chest: CTAB, no wheezes, rales, or rhonchi Abdominal: Soft. Non-tender, non-distended, bowel sounds are normal, no masses, organomegaly, or guarding present.  GU: no CVA tenderness Musculoskeletal: No joint deformities, erythema, or stiffness, ROM full in shoulders, hips, knees, and ankles are limited by pain and resistance to passive movement.  She is mildly tender to palpation of the large muscle groups in her upper legs and lower legs as well as over her biceps. There is also paraspinous tenderness in her thoracic and lumbar spines.  No pain to palpation directly over the spine. Hematology: no cervical, inginal, or axillary adenopathy.  Neurological: A&O x3, Strength is normal and symmetric bilaterally, cranial nerve II-XII are grossly intact, no focal motor deficit, sensory intact to light touch bilaterally.  Gait is with a limp favoring the left leg while in  the office but not on leaving the office.  Rhomberg's is negative, normal heal to shin and finger to nose.    Skin: Warm, dry and intact. No rash, cyanosis, or  clubbing.  Psychiatric: mood is sad and occasionally tearful and affect is flat. speech and behavior is normal. Judgment and thought content normal. Cognition and memory are normal.    Assessment & Plan:

## 2011-02-02 NOTE — Patient Instructions (Signed)
Use the Flector Patches twice daily for your back and leg pain.  You can also use a heating pad or ice to help with the pain.   Start Amitriptyline 25 mg tablets.  Take one tablet daily at bedtime to help with your sleep.  Follow up in 2 weeks to see how your doing and to recheck your blood pressure.

## 2011-02-03 DIAGNOSIS — F32A Depression, unspecified: Secondary | ICD-10-CM | POA: Insufficient documentation

## 2011-02-03 DIAGNOSIS — F329 Major depressive disorder, single episode, unspecified: Secondary | ICD-10-CM | POA: Insufficient documentation

## 2011-02-03 NOTE — Assessment & Plan Note (Signed)
She is well controlled and hasn't had a breakthrough seizure in about a year.  She will continue on her Keppra at its current dose.

## 2011-02-03 NOTE — Assessment & Plan Note (Signed)
We discussed quitting smoking today as well for helping her general health.  She has cut back and would like to quit.  We will continue to encourage cessation.

## 2011-02-03 NOTE — Assessment & Plan Note (Signed)
I think the majority of her pain is from situational depression related to her current social situation.  Fibromyalgia is also a possibility.  We will treat her for the pain as well as see if we can give her something to help with her sleep and reassess.  Since the Voltaren gel was working so well for her but is no longer available we will go with the Flector Patches.  I will also start amitriptyline 25 mg Qhs and have her follow up to reassess how she is doing.

## 2011-02-03 NOTE — Assessment & Plan Note (Addendum)
She has some situational features of depression related to her current living situation.  She is working on trying to get out and get her own place.  At this time she feels safe at home and states that no one is hurting her.  She also denies SI/HI and other major warning signs.  I think though her musculoskeletal pain is secondary to her depression.  We will start with a small amount of amitriptyline at bedtime and follow up.

## 2011-02-03 NOTE — Assessment & Plan Note (Signed)
She is has paraspinous tenderness and is likely from the situational depression vs. Fibromyalgia.  We will treat it with the Flector patches and the amitriptyline and see how she does.  She has no acute needs for imaging and no red flags for critical spinal stenosis or nerve impingement.

## 2011-02-08 NOTE — Procedures (Signed)
EEG:  [id number].   CLINICAL HISTORY:  The patient is a 45 year old woman with history of  seizures since childhood.  She has been on Tegretol and Dilantin for  years.  She was taken off Dilantin a couple of months ago and has had  increasing seizure frequency. (345.10)   PROCEDURE:  The tracing is carried out on a 32-channel digital Cadwell  recorder reformatted into 16-channel montages with one devoted to EKG.  The patient was awake during the recording.  The [employer] 10/20  system lead placement was used.   MEDICATIONS:  Include Tegretol.   DESCRIPTION OF FINDINGS:  Dominant frequency is a 9 Hz, 40 mcV.  Activity that is well modulated and regulated and attenuates partially  with eye opening.  Background activity caused no significant change.  Hyperventilation caused no change.  Photic stimulation induced a partial  driving response.   EKG showed a regular sinus rhythm with ventricular response of 84 beats  per minute.   IMPRESSION:  Normal waking record.      Shelia Arnold. Sharene Skeans, M.D.  Electronically Signed     ZOX:WRUE  D:  07/17/2008 23:59:24  T:  07/18/2008 04:04:10  Job #:  [id number]   cc:   Edsel Petrin, D.O.  Fax: [phone]

## 2011-02-11 NOTE — Group Therapy Note (Signed)
NAME:  Shelia Arnold, Shelia Arnold NO.:  [id number]   MEDICAL RECORD NO.:  [id number]          PATIENT TYPE:  WOC   LOCATION:  [facility]                   FACILITY:  [facility]   PHYSICIAN:  Carolanne Grumbling, M.D.   DATE OF BIRTH:  1965-12-05   DATE OF SERVICE:                                    CLINIC NOTE   HISTORY OF PRESENT ILLNESS:  A 45 year old female here for colposcopy  results.  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  The colposcopic exam was  considered unsatisfactory because the ends of the lesion were not seen by  ECC.  Patient is a smoker.  Patient was counseled on the results, and  decision is made to proceed with a LEEP.   IMPRESSION:  Cervical dysplasia.   PLAN:  LEEP.           ______________________________  Carolanne Grumbling, M.D.     TW/MEDQ  D:  07/15/2005  T:  07/15/2005  Job:  [id number]

## 2011-02-11 NOTE — Group Therapy Note (Signed)
NAME:  Shelia Arnold, Shelia Arnold NO.:  [id number]   MEDICAL RECORD NO.:  [id number]          PATIENT TYPE:  WOC   LOCATION:  [facility]                   FACILITY:  [facility]   PHYSICIAN:  Argentina Donovan, MD        DATE OF BIRTH:  1966/05/03   DATE OF SERVICE:  11/08/2006                                  CLINIC NOTE   The patient is a 45 year old white female gravida 1, para 0 who is  somewhat cachectic at the present time weighs 90 pounds and is 5 feet  tall.  She has had a lifelong congenital the seizure disorder for which  she is successfully being treated.  She had an abnormal Pap smear in  2005, was seen by [place] in 2006.  Repeat Pap was done.  The patient that  was to have a colposcopy, but then never followed up.  She was finally  contacted and is in for a repeat Pap now 1 year later, which was done,  as well as a wet prep since she previously had Trichomonas.  She has a  significant positive whiff test, which I will treat today.  The patient  is also complaining of abdominal pain.  She has a midline cord that is  exquisitely tender about 3 cm above the symphysis pubis right in the  midline.  I am not sure exactly what this is but she said has only been  there for about a week.   EXAMINATION:  External genitalia is normal.  BUS within normal limits.  Vagina is significant by the positive whiff test and heavy foamy whitish  discharge.  Cervix is clean and nulliparous.  Pap smear was taken.  Uterus anterior normal size, shape, consistency and adnexa is normal.  Tiny little support tenderness is very superficial.   IMPRESSION:  Abdominal pain and will be reevaluated in 2 weeks when the  patient comes for the Pap smear review, and decide what we are going to  do about the abnormal Pap smear.  Meanwhile, I am going to treat her  with Flagyl 500 b.i.d. for 7 days.           ______________________________  Argentina Donovan, MD     PR/MEDQ  D:  11/08/2006  T:  11/08/2006  Job:   [id number]

## 2011-02-15 ENCOUNTER — Telehealth: Payer: Self-pay | Admitting: Licensed Clinical Social Worker

## 2011-02-15 NOTE — Telephone Encounter (Signed)
Called Shelia Arnold to assist her with housing resource.  Shelia Arnold finally has gotten Disability of $465 per month/$200 in Foodstamps.  She is living with a friend right now and her transit is limited except for the bus which she doesn't like to take alone.  She wants her own place for some peace and quiet apparently.   I have given her information about the [employer] which specifically has housing counselors who will work with her to find her own place.  She can afford about $200 for rent.   Shelia Arnold has an appmt tomorrow at 3:45 with her doctor and I will give resource info to her nurse so she can follow up at some point.

## 2011-02-16 ENCOUNTER — Ambulatory Visit (INDEPENDENT_AMBULATORY_CARE_PROVIDER_SITE_OTHER): Payer: Medicaid Other | Admitting: Internal Medicine

## 2011-02-16 ENCOUNTER — Encounter: Payer: Self-pay | Admitting: Internal Medicine

## 2011-02-16 DIAGNOSIS — F329 Major depressive disorder, single episode, unspecified: Secondary | ICD-10-CM

## 2011-02-16 DIAGNOSIS — M7918 Myalgia, other site: Secondary | ICD-10-CM

## 2011-02-16 DIAGNOSIS — IMO0001 Reserved for inherently not codable concepts without codable children: Secondary | ICD-10-CM

## 2011-02-16 DIAGNOSIS — F341 Dysthymic disorder: Secondary | ICD-10-CM

## 2011-02-16 NOTE — Progress Notes (Signed)
  Subjective:    Patient ID: Shelia Arnold, female    DOB: 07-04-1966, 45 y.o.   MRN: [id number]  HPI  Shelia Arnold is a 45 year old woman who presents today for follow up from her last appointment.  She was started on amitriptyline 25 mg Qhs at that visit because of situational depression, insomnia, and diffuse musculoskeletal pain.  She has been taking the medication and denies any side effects.  She is still living with her boyfriend but was given information from Dorothe Pea about the Housing coalition of the [place] to work on finding her own place to live.  She states that she has been sleeping much better and her muscle pain has improved greatly.  She has also been using the Flector patches and they seem to be helping as well.  She denies any SI/HI, worsening depression, or problems sleeping.    Review of Systems    Constitutional: Denies fever, chills, diaphoresis, appetite change and fatigue.  HEENT: Denies photophobia, eye pain, redness, hearing loss, ear pain, congestion, sore throat, rhinorrhea, sneezing, mouth sores, trouble swallowing, neck pain, neck stiffness and tinnitus.   Respiratory: Denies SOB, DOE, cough, chest tightness,  and wheezing.   Cardiovascular: Denies chest pain, palpitations and leg swelling.  Gastrointestinal: Denies nausea, vomiting, abdominal pain, diarrhea, constipation, blood in stool and abdominal distention.  Genitourinary: Denies dysuria, urgency, frequency, hematuria, flank pain and difficulty urinating.  Musculoskeletal: Denies myalgias, back pain, joint swelling, arthralgias and gait problem.  Skin: Denies pallor, rash and wound.  Neurological: Denies dizziness, seizures, syncope, weakness, light-headedness, numbness and headaches.  Hematological: Denies adenopathy. Easy bruising, personal or family bleeding history  Psychiatric/Behavioral: Denies suicidal ideation, mood changes, confusion, nervousness, sleep disturbance and  agitation  Objective:   Physical Exam    Constitutional: Vital signs reviewed.  Patient is a well-developed and well-nourished woman in no acute distress and cooperative with exam. Alert and oriented x3.  Head: Normocephalic and atraumatic Ear: TM normal bilaterally Mouth: no erythema or exudates, MMM Eyes: PERRL, EOMI, conjunctivae normal, No scleral icterus.  Neck: Supple, Trachea midline normal ROM, No JVD, mass, thyromegaly, or carotid bruit present.  Cardiovascular: RRR, S1 normal, S2 normal, no MRG, pulses symmetric and intact bilaterally Pulmonary/Chest: CTAB, no wheezes, rales, or rhonchi Abdominal: Soft. Non-tender, non-distended, bowel sounds are normal, no masses, organomegaly, or guarding present.  GU: no CVA tenderness Musculoskeletal: No joint deformities, erythema, or stiffness, ROM full and no nontender Hematology: no cervical, inginal, or axillary adenopathy.  Neurological: A&O x3, Strenght is normal and symmetric bilaterally, cranial nerve II-XII are grossly intact, no focal motor deficit, sensory intact to light touch bilaterally.  Skin: Warm, dry and intact. No rash, cyanosis, or clubbing.  Psychiatric: Normal mood and affect. speech and behavior is normal. Judgment and thought content normal. Cognition and memory are normal.   Assessment & Plan:

## 2011-02-16 NOTE — Patient Instructions (Addendum)
Continue your medications as prescribed.  Keep working with the housing to see if they can help you find a place of your own to live.    Follow up sometime after July 15th with Dr. Phillips Odor for your routine primary care items.    If you have any questions or need any help please feel free to call us.

## 2011-02-19 ENCOUNTER — Encounter: Payer: Self-pay | Admitting: Internal Medicine

## 2011-02-22 NOTE — Assessment & Plan Note (Signed)
Her pain is much better today with the Flector patch and the better sleep.

## 2011-02-22 NOTE — Assessment & Plan Note (Signed)
Her depression is better today after we were able to help her sleep some.  She has a difficult social situation still currently so we will continue to monitor until she can find her own place.

## 2011-04-15 ENCOUNTER — Other Ambulatory Visit: Payer: Self-pay | Admitting: Internal Medicine

## 2011-04-15 DIAGNOSIS — Z1231 Encounter for screening mammogram for malignant neoplasm of breast: Secondary | ICD-10-CM

## 2011-05-04 ENCOUNTER — Ambulatory Visit (INDEPENDENT_AMBULATORY_CARE_PROVIDER_SITE_OTHER): Payer: Medicaid Other | Admitting: Internal Medicine

## 2011-05-04 ENCOUNTER — Encounter: Payer: Self-pay | Admitting: Internal Medicine

## 2011-05-04 VITALS — BP 137/88 | HR 72 | Temp 98.0°F | Ht 60.0 in | Wt 103.9 lb

## 2011-05-04 DIAGNOSIS — F341 Dysthymic disorder: Secondary | ICD-10-CM

## 2011-05-04 DIAGNOSIS — F329 Major depressive disorder, single episode, unspecified: Secondary | ICD-10-CM

## 2011-05-04 DIAGNOSIS — M7918 Myalgia, other site: Secondary | ICD-10-CM

## 2011-05-04 DIAGNOSIS — R569 Unspecified convulsions: Secondary | ICD-10-CM

## 2011-05-04 DIAGNOSIS — G40909 Epilepsy, unspecified, not intractable, without status epilepticus: Secondary | ICD-10-CM

## 2011-05-04 DIAGNOSIS — IMO0001 Reserved for inherently not codable concepts without codable children: Secondary | ICD-10-CM

## 2011-05-04 NOTE — Assessment & Plan Note (Signed)
Pt reports good relief with current therapy of Dicloflenac patch and gel.

## 2011-05-04 NOTE — Patient Instructions (Signed)
It was nice meeting you.  Please return for a follow up visit with me in 6 months.  If you have a seizure , please come back to the clinic.

## 2011-05-04 NOTE — Assessment & Plan Note (Addendum)
Reports 2 seizures in the past 6 months with the last seizure yesterday which pt describes as "mini".  Pt prefers to continue on current Keppra dosage 750mg  po q12h.  Will f/u in 6 months or earlier if continued breakthrough seizures and consider increasing Keppra dosage.
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
## 2011-05-04 NOTE — Assessment & Plan Note (Signed)
Stressors continued at home. Pt denies depressed mood, suicidal/homicidal ideation.  Pt reports better mood since starting Amitriptyline 25mg  po qhs.  This may be partially due to her ability to get more restful sleep.  Will continue to monitor.

## 2011-05-05 LAB — BASIC METABOLIC PANEL WITH GFR
BUN: 17 mg/dL (ref 6–23)
CO2: 22 mEq/L (ref 19–32)
Calcium: 9.5 mg/dL (ref 8.4–10.5)
Creat: 0.58 mg/dL (ref 0.50–1.10)
GFR, Est African American: >60 mL/min (ref >60)
Glucose, Bld: 83 mg/dL (ref 70–99)

## 2011-05-05 NOTE — Progress Notes (Signed)
Agree with plans and notes. 

## 2011-05-11 ENCOUNTER — Other Ambulatory Visit: Payer: Self-pay | Admitting: *Deleted

## 2011-05-11 MED ORDER — DICLOFENAC SODIUM 1 % TD GEL: 1.0000 "application " | Freq: Four times a day (QID) | TRANSDERMAL | Status: DC

## 2011-05-11 NOTE — Telephone Encounter (Signed)
Last OV 05/04/11.

## 2011-05-19 ENCOUNTER — Ambulatory Visit (HOSPITAL_COMMUNITY)
Admission: RE | Admit: 2011-05-19 | Discharge: 2011-05-19 | Disposition: A | Discharge status: Home / Self Care | Payer: Medicaid Other | Source: Ambulatory Visit | Attending: Family Medicine | Admitting: Family Medicine

## 2011-05-19 DIAGNOSIS — Z1231 Encounter for screening mammogram for malignant neoplasm of breast: Secondary | ICD-10-CM

## 2011-05-27 ENCOUNTER — Other Ambulatory Visit: Payer: Self-pay | Admitting: Internal Medicine

## 2011-05-27 DIAGNOSIS — Z1231 Encounter for screening mammogram for malignant neoplasm of breast: Secondary | ICD-10-CM

## 2011-05-31 ENCOUNTER — Encounter: Payer: Medicaid Other | Admitting: Internal Medicine

## 2011-06-09 ENCOUNTER — Ambulatory Visit (HOSPITAL_COMMUNITY)
Admission: RE | Admit: 2011-06-09 | Discharge: 2011-06-09 | Disposition: A | Discharge status: Home / Self Care | Payer: Medicaid Other | Source: Ambulatory Visit | Attending: Internal Medicine | Admitting: Internal Medicine

## 2011-06-09 DIAGNOSIS — Z1231 Encounter for screening mammogram for malignant neoplasm of breast: Secondary | ICD-10-CM

## 2011-06-17 IMAGING — CR DG CHEST 2V
2 series · 2 of 2 positions shown · non-contrast
Comparison: None.

CLINICAL DATA: Seizure.  Low O2 sats.  Altered mental status.

CHEST - 2 VIEW

[w chest pa]
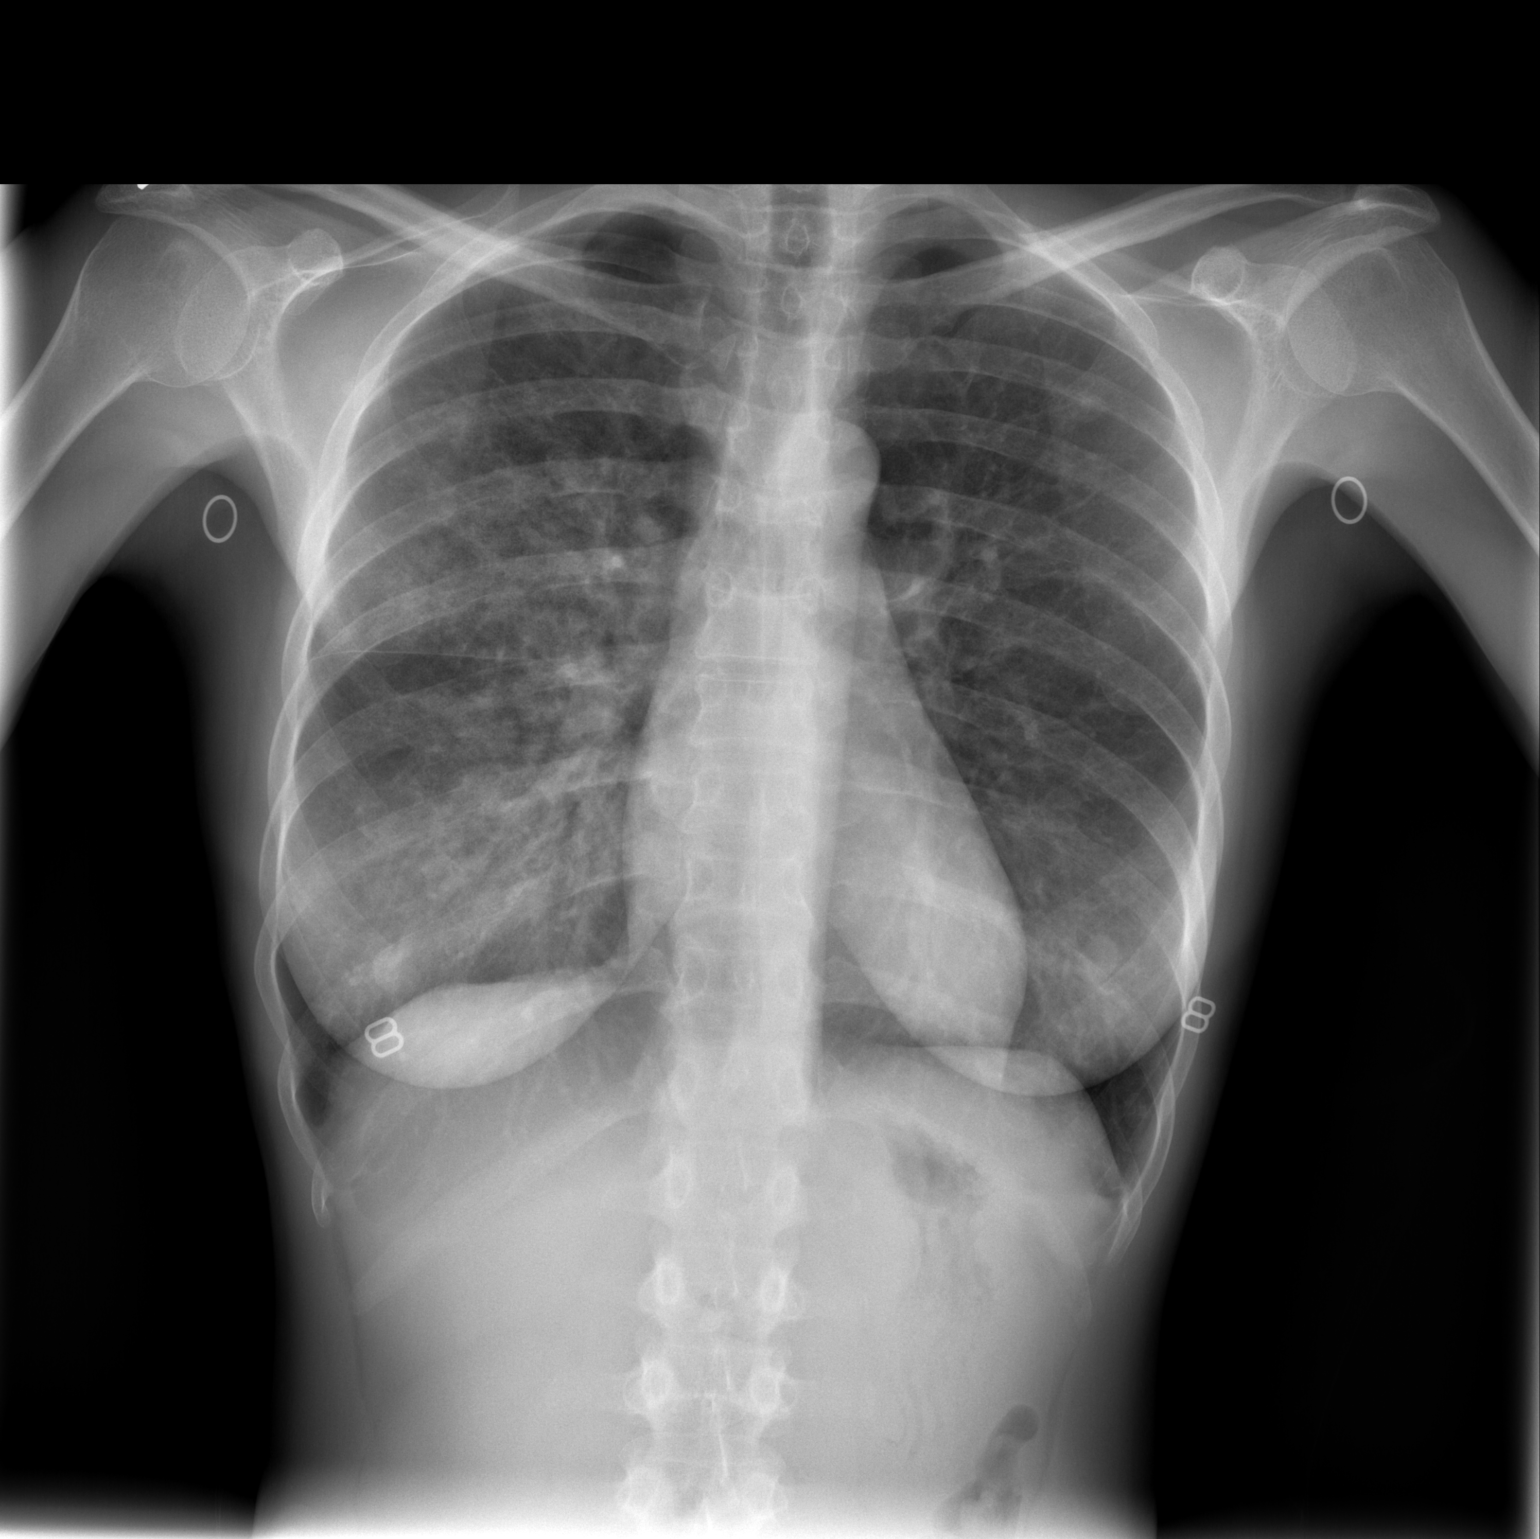

[w chest lat]
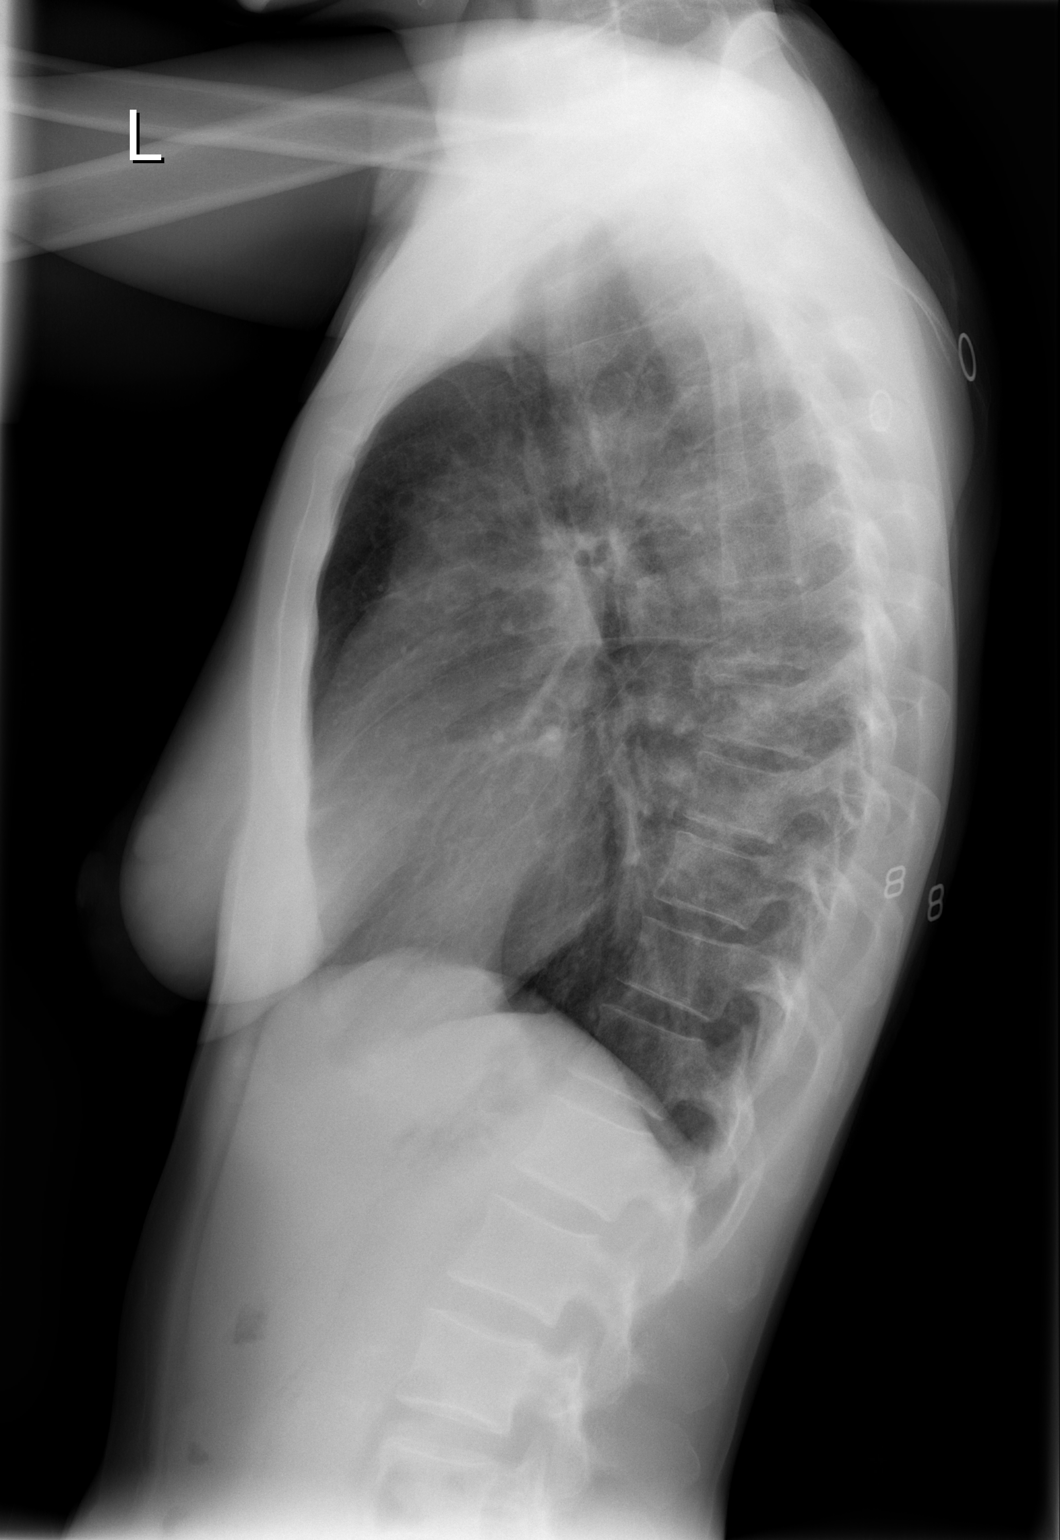

[2 of 2 positions shown; findings below may reference images not displayed]

FINDINGS: Extensive infiltrate involves the right lung
predominately the right lower lobe including the superior segment.
Findings consistent with pneumonia. May be a patch of infiltrate in
the left upper lobe.
IMPRESSION: Acute pneumonia involving the right lung.  Question small patch of
infiltrate in the left upper lobe as well.

## 2011-09-30 ENCOUNTER — Other Ambulatory Visit: Payer: Self-pay | Admitting: *Deleted

## 2011-09-30 MED ORDER — DICLOFENAC SODIUM 1 % TD GEL: 1.0000 "application " | Freq: Four times a day (QID) | TRANSDERMAL | Status: DC

## 2011-11-03 ENCOUNTER — Ambulatory Visit (INDEPENDENT_AMBULATORY_CARE_PROVIDER_SITE_OTHER): Payer: Self-pay | Admitting: Internal Medicine

## 2011-11-03 ENCOUNTER — Encounter: Payer: Self-pay | Admitting: Internal Medicine

## 2011-11-03 DIAGNOSIS — G40909 Epilepsy, unspecified, not intractable, without status epilepticus: Secondary | ICD-10-CM

## 2011-11-03 DIAGNOSIS — M545 Low back pain: Secondary | ICD-10-CM

## 2011-11-03 DIAGNOSIS — M549 Dorsalgia, unspecified: Secondary | ICD-10-CM

## 2011-11-03 DIAGNOSIS — R569 Unspecified convulsions: Secondary | ICD-10-CM

## 2011-11-03 DIAGNOSIS — M7918 Myalgia, other site: Secondary | ICD-10-CM

## 2011-11-03 DIAGNOSIS — F17200 Nicotine dependence, unspecified, uncomplicated: Secondary | ICD-10-CM

## 2011-11-03 MED ORDER — LEVETIRACETAM 750 MG PO TABS: 750.0000 mg | ORAL_TABLET | Freq: Two times a day (BID) | ORAL | Status: DC

## 2011-11-03 MED ORDER — DICLOFENAC SODIUM 1 % TD GEL: 1.0000 "application " | Freq: Four times a day (QID) | TRANSDERMAL | Status: DC

## 2011-11-03 MED ORDER — DICLOFENAC EPOLAMINE 1.3 % TD PTCH: 1.0000 | MEDICATED_PATCH | Freq: Two times a day (BID) | TRANSDERMAL | Status: DC

## 2011-11-03 NOTE — Patient Instructions (Signed)
1. Please take all medications as prescribed.  2. If you have worsening of your symptoms or new symptoms arise, please call the clinic ([phone]), or go to the ER immediately if symptoms are severe. 

## 2011-11-03 NOTE — Progress Notes (Signed)
Subjective:   Patient ID: Shelia Arnold female   DOB: 25-Jun-1966 46 y.o.   MRN: [id number]  HPI:  Shelia Arnold is a 46 y.o. with PMH of seizure disorder, diffuse musculoskeletal pains and back pain, situational depression, who presents for a regular check-up visit. She reports taking her Keppra 750mg  po bid as scheduled without any missed doses. her seizure is well controlled except for one episode of "mini" seizure 2 weeks ago, which did not bother her at all. She said when she woke up in the morning, she felt that she was chewing her tongue. She did not have shaking or clonic seizure episodes. Her chronic pain is controlled well with Voltaren gel. Regarding her depression, she is not taking any medications now. She does not have the any symptoms for depression. She feels good and normal and does not want to continue her depression medications.  Denies fever, chills, fatigue, headaches,  cough, chest pain, SOB,  abdominal pain,diarrhea, constipation, dysuria, urgency, frequency, hematuria.  No past medical history on file. Current Outpatient Prescriptions  Medication Sig Dispense Refill  . diclofenac (FLECTOR) 1.3 % PTCH Place 1 patch onto the skin 2 (two) times daily.  60 patch  6  . diclofenac sodium (VOLTAREN) 1 % GEL Apply 1 application topically 4 (four) times daily.  100 g  6  . levETIRAcetam (KEPPRA) 750 MG tablet Take 1 tablet (750 mg total) by mouth every 12 (twelve) hours.  60 tablet  11   No family history on file. History   Social History  . Marital Status: Single    Spouse Name: N/A    Number of Children: N/A  . Years of Education: N/A   Social History Main Topics  . Smoking status: Current Everyday Smoker -- 0.5 packs/day for 15 years    Types: Cigarettes  . Smokeless tobacco: None  . Alcohol Use: 12.0 oz/week    20 Cans of beer per week  . Drug Use: Yes    Special: Marijuana  . Sexually Active: None   Other Topics Concern  . None   Social History  Narrative   Single, does not regularly exercise, and completed the 7th grade.Mother- dec.- died during childbirth, birth traumaBrother- epilepsy- dec- 30'sFather- unknownNo known history of cancer in 1st degree family members   Review of Systems:  General: no fevers, chills, no changes in body weight, no changes in appetite Skin: no rash HEENT: no blurry vision, hearing changes or sore throat Pulm: no dyspnea, coughing, wheezing CV: no chest pain, palpitations, shortness of breath Abd: no nausea/vomiting, abdominal pain, diarrhea/constipation GU: no dysuria, hematuria, polyuria Ext: has pain over knee joints and back. Neuro: no weakness, numbness, or tingling   Objective:  Physical Exam: Filed Vitals:   11/03/11 1132  BP: 144/82  Pulse: 65  Temp: 98.4 F (36.9 C)  TempSrc: Oral  Height: 5' (1.524 m)  Weight: 110 lb 11.2 oz (50.213 kg)   general: resting in bed, not in acute distress HEENT: PERRL, EOMI, no scleral icterus Cardiac: S1/S2, RRR, No murmurs, gallops or rubs Pulm: Good air movement bilaterally, Clear to auscultation bilaterally, No rales, wheezing, rhonchi or rubs. Abd: Soft,  nondistended, nontender, no rebound pain, no organomegaly, BS present Musculoskeletal: Normal range of motion. paraspinous tenderness, No pain in the midline of spine. EXt: No rashes or edema, 2+DP/PT pulse bilaterally Neuro: alert and oriented X3, cranial nerves II-XII grossly intact, muscle strength 5/5 in all extremeties,  sensation to light touch intact.  Assessment & Plan:   # seizure disorder: well controlled with keppra. Will continue with current regimen and follow up. # Depression: resolved. Will follow up.  # back pain: She is has Paraspinous tenderness. No pain in the midline of spine. No red flag symptoms for imaging now. It is well controlled by Voltaren gel. Will continue the current regimen.   # Tobacco abuse: We discussed quitting smoking today. She would like to  consider it.  # HM: she refused to get flu shot today. Will ask again in next visit.

## 2011-11-23 ENCOUNTER — Encounter: Payer: Medicaid Other | Admitting: Internal Medicine

## 2012-04-25 ENCOUNTER — Other Ambulatory Visit: Payer: Self-pay | Admitting: Internal Medicine

## 2012-05-03 ENCOUNTER — Other Ambulatory Visit: Payer: Self-pay | Admitting: Internal Medicine

## 2012-05-03 DIAGNOSIS — Z1231 Encounter for screening mammogram for malignant neoplasm of breast: Secondary | ICD-10-CM

## 2012-06-11 ENCOUNTER — Ambulatory Visit (HOSPITAL_COMMUNITY)
Admission: RE | Admit: 2012-06-11 | Discharge: 2012-06-11 | Disposition: A | Discharge status: Home / Self Care | Payer: Medicaid Other | Source: Ambulatory Visit | Attending: Internal Medicine | Admitting: Internal Medicine

## 2012-06-11 DIAGNOSIS — Z1231 Encounter for screening mammogram for malignant neoplasm of breast: Secondary | ICD-10-CM | POA: Insufficient documentation

## 2012-06-13 ENCOUNTER — Other Ambulatory Visit: Payer: Self-pay | Admitting: Internal Medicine

## 2012-06-13 DIAGNOSIS — R928 Other abnormal and inconclusive findings on diagnostic imaging of breast: Secondary | ICD-10-CM

## 2012-06-19 ENCOUNTER — Ambulatory Visit
Admission: RE | Admit: 2012-06-19 | Discharge: 2012-06-19 | Disposition: A | Discharge status: Home / Self Care | Payer: Medicaid Other | Source: Ambulatory Visit | Attending: Internal Medicine | Admitting: Internal Medicine

## 2012-06-19 DIAGNOSIS — R928 Other abnormal and inconclusive findings on diagnostic imaging of breast: Secondary | ICD-10-CM

## 2012-08-27 ENCOUNTER — Other Ambulatory Visit: Payer: Self-pay | Admitting: *Deleted

## 2012-08-27 DIAGNOSIS — M549 Dorsalgia, unspecified: Secondary | ICD-10-CM

## 2012-08-28 MED ORDER — DICLOFENAC SODIUM 1 % TD GEL: 1.0000 "application " | Freq: Four times a day (QID) | TRANSDERMAL | Status: DC

## 2012-08-28 NOTE — Telephone Encounter (Signed)
Called to pharm 

## 2012-08-31 ENCOUNTER — Telehealth: Payer: Self-pay | Admitting: *Deleted

## 2012-08-31 NOTE — Telephone Encounter (Signed)
Pt requesting refill on - Amitriptylin 25mg  - take 1 tab at bedtime.  Last refilled 07/07/12.

## 2012-09-04 ENCOUNTER — Other Ambulatory Visit: Payer: Self-pay | Admitting: Internal Medicine

## 2012-09-04 DIAGNOSIS — M545 Low back pain: Secondary | ICD-10-CM

## 2012-09-04 DIAGNOSIS — F329 Major depressive disorder, single episode, unspecified: Secondary | ICD-10-CM

## 2012-09-04 DIAGNOSIS — M7918 Myalgia, other site: Secondary | ICD-10-CM

## 2012-09-04 MED ORDER — AMITRIPTYLINE HCL 25 MG PO TABS: 25.0000 mg | ORAL_TABLET | Freq: Every day | ORAL | Status: DC

## 2012-09-10 NOTE — Telephone Encounter (Signed)
See refill 09/04/12 per Dr Bosie Clos.

## 2012-10-22 ENCOUNTER — Encounter: Payer: Medicaid Other | Admitting: Internal Medicine

## 2012-10-29 ENCOUNTER — Ambulatory Visit (INDEPENDENT_AMBULATORY_CARE_PROVIDER_SITE_OTHER): Payer: Medicaid Other | Admitting: Internal Medicine

## 2012-10-29 ENCOUNTER — Ambulatory Visit (HOSPITAL_COMMUNITY)
Admission: RE | Admit: 2012-10-29 | Discharge: 2012-10-29 | Disposition: A | Discharge status: Home / Self Care | Payer: Medicaid Other | Source: Ambulatory Visit | Attending: Internal Medicine | Admitting: Internal Medicine

## 2012-10-29 ENCOUNTER — Encounter: Payer: Self-pay | Admitting: Internal Medicine

## 2012-10-29 VITALS — BP 145/89 | HR 71 | Temp 98.9°F | Resp 20 | Ht 61.5 in | Wt 104.7 lb

## 2012-10-29 DIAGNOSIS — R079 Chest pain, unspecified: Secondary | ICD-10-CM | POA: Insufficient documentation

## 2012-10-29 DIAGNOSIS — M545 Low back pain, unspecified: Secondary | ICD-10-CM

## 2012-10-29 DIAGNOSIS — I10 Essential (primary) hypertension: Secondary | ICD-10-CM | POA: Insufficient documentation

## 2012-10-29 DIAGNOSIS — R569 Unspecified convulsions: Secondary | ICD-10-CM

## 2012-10-29 DIAGNOSIS — R030 Elevated blood-pressure reading, without diagnosis of hypertension: Secondary | ICD-10-CM

## 2012-10-29 DIAGNOSIS — F17200 Nicotine dependence, unspecified, uncomplicated: Secondary | ICD-10-CM

## 2012-10-29 LAB — COMPREHENSIVE METABOLIC PANEL
BUN: 15 mg/dL (ref 6–23)
Calcium: 10 mg/dL (ref 8.4–10.5)
Creat: 0.64 mg/dL (ref 0.50–1.10)
Potassium: 4.4 mEq/L (ref 3.5–5.3)
Total Bilirubin: 0.4 mg/dL (ref 0.3–1.2)
Total Protein: 7.4 g/dL (ref 6.0–8.3)

## 2012-10-29 LAB — LIPID PANEL
HDL: 78 mg/dL (ref >39)
LDL Cholesterol: 76 mg/dL (ref 0–99)
Total CHOL/HDL Ratio: 2.2 Ratio
Triglycerides: 98 mg/dL (ref <150)
VLDL: 20 mg/dL (ref 0–40)

## 2012-10-29 LAB — CBC
HCT: 43.1 % (ref 36.0–46.0)
MCHC: 33.9 g/dL (ref 30.0–36.0)
Platelets: 238 10*3/uL (ref 150–400)
RDW: 13.3 % (ref 11.5–15.5)
WBC: 8.6 10*3/uL (ref 4.0–10.5)

## 2012-10-29 LAB — HEMOGLOBIN A1C: Mean Plasma Glucose: 111 mg/dL (ref <117)

## 2012-10-29 MED ORDER — LEVETIRACETAM 1000 MG PO TABS: 1000.0000 mg | ORAL_TABLET | Freq: Two times a day (BID) | ORAL | Status: DC

## 2012-10-29 MED ORDER — HYDROCHLOROTHIAZIDE 25 MG PO TABS: 25.0000 mg | ORAL_TABLET | Freq: Every day | ORAL | Status: DC

## 2012-10-29 NOTE — Assessment & Plan Note (Addendum)
Unclear etiology. Obtain ECG which was notable for sinus bradycardia. . Chest xray from 2011 was notable for possible pulmonary edema. No 2 D echo in records. Will obtain TSH . Advised patient to go to the ED if patient is experiencing significant chest pain
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
## 2012-10-29 NOTE — Assessment & Plan Note (Signed)
Did not address this during this office visit although patient was complaining about it

## 2012-10-29 NOTE — Addendum Note (Signed)
Addended by: Almyra Deforest on: 10/29/2012 01:01 PM   Modules accepted: Orders

## 2012-10-29 NOTE — Addendum Note (Signed)
Addended by: Almyra Deforest on: 10/29/2012 04:59 PM   Modules accepted: Orders

## 2012-10-29 NOTE — Patient Instructions (Signed)
Please go to the ED if you experience any Chest pain.

## 2012-10-29 NOTE — Assessment & Plan Note (Signed)
Patient had elevated BP in the past and was on medication therapy. Can not remember which. She has not been taking it for a while.  Repeated BP elevated. Complains about Headache, chest pain /palpition and occasionally SOB ( does smoke 1/2 ppd ) .  Will obtain ECG, TSH, Hgb A1c. Will start on HCTZ 25 mg daily. Reevaluate in  2 weeks.

## 2012-10-29 NOTE — Progress Notes (Deleted)
Subjective:   Patient ID: Shelia Arnold female   DOB: [birth year] 47 y.o.   MRN: [id number]  HPI: Shelia Arnold is a 47 y.o. female   1. Seizure : Saliva on the pillow, twisting of her left lip and some shaking of hands, start  Moving her legs uncontrolled. She is by herself at home. It could be any time of the day.  If less sleep then it is worse. Once had to urinary incontinence and had tong bites.     2. BP: elevated. Headache, chest pain, palpitation  and SOB occasionally .       No past medical history on file. Current Outpatient Prescriptions  Medication Sig Dispense Refill  . amitriptyline (ELAVIL) 25 MG tablet Take 1 tablet (25 mg total) by mouth at bedtime.  30 tablet  11  . diclofenac (FLECTOR) 1.3 % PTCH Place 1 patch onto the skin 2 (two) times daily.  60 patch  6  . diclofenac sodium (VOLTAREN) 1 % GEL Apply 1 application topically 4 (four) times daily.  100 g  6  . levETIRAcetam (KEPPRA) 750 MG tablet Take 1 tablet (750 mg total) by mouth every 12 (twelve) hours.  60 tablet  11  . VOLTAREN 1 % GEL APPLY 4 TIMES DAILY  100 g  2   No family history on file. History   Social History  . Marital Status: Single    Spouse Name: N/A    Number of Children: N/A  . Years of Education: N/A   Social History Main Topics  . Smoking status: Current Every Day Smoker -- 0.5 packs/day for 15 years    Types: Cigarettes  . Smokeless tobacco: Not on file     Comment: trying to cut back a little each day  . Alcohol Use: 12.0 oz/week    20 Cans of beer per week  . Drug Use: Yes    Special: Marijuana  . Sexually Active: Not on file   Other Topics Concern  . Not on file   Social History Narrative   Single, does not regularly exercise, and completed the 7th grade.Mother- dec.- died during childbirth, birth traumaBrother- epilepsy- dec- 30'sFather- unknownNo known history of cancer in 1st degree family members   Review of Systems: Constitutional: Denies fever,  chills, diaphoresis, appetite change and fatigue.  HEENT: Denies photophobia, eye pain, redness, hearing loss, ear pain, congestion, sore throat, rhinorrhea, sneezing, mouth sores, trouble swallowing, neck pain, neck stiffness and tinnitus.   Respiratory: Denies SOB, DOE, cough, chest tightness,  and wheezing.   Cardiovascular: Denies chest pain, palpitations and leg swelling.  Gastrointestinal: Denies nausea, vomiting, abdominal pain, diarrhea, constipation, blood in stool and abdominal distention.  Genitourinary: Denies dysuria, urgency, frequency, hematuria, flank pain and difficulty urinating.  Musculoskeletal: Denies myalgias, back pain, joint swelling, arthralgias and gait problem.  Skin: Denies pallor, rash and wound.  Neurological: Denies dizziness, seizures, syncope, weakness, light-headedness, numbness and headaches.  Hematological: Denies adenopathy. Easy bruising, personal or family bleeding history  Psychiatric/Behavioral: Denies suicidal ideation, mood changes, confusion, nervousness, sleep disturbance and agitation  Objective:  Physical Exam: Filed Vitals:   10/29/12 1029  BP: 145/89  Pulse: 71  Temp: 98.9 F (37.2 C)  TempSrc: Oral  Resp: 20  Height: 5' 1.5" (1.562 m)  Weight: 104 lb 11.2 oz (47.492 kg)  SpO2: 100%   Constitutional: Vital signs reviewed.  Patient is a well-developed and well-nourished *** in no acute distress and cooperative with exam. Alert and oriented x3.  Head: Normocephalic and atraumatic Ear: TM normal bilaterally Mouth: no erythema or exudates, MMM Eyes: PERRL, EOMI, conjunctivae normal, No scleral icterus.  Neck: Supple, Trachea midline normal ROM, No JVD, mass, thyromegaly, or carotid bruit present.  Cardiovascular: RRR, S1 normal, S2 normal, no MRG, pulses symmetric and intact bilaterally Pulmonary/Chest: CTAB, no wheezes, rales, or rhonchi Abdominal: Soft. Non-tender, non-distended, bowel sounds are normal, no masses, organomegaly, or  guarding present.  GU: no CVA tenderness Musculoskeletal: No joint deformities, erythema, or stiffness, ROM full and no nontender Hematology: no cervical, inginal, or axillary adenopathy.  Neurological: A&O x3, Strength is normal and symmetric bilaterally, cranial nerve II-XII are grossly intact, no focal motor deficit, sensory intact to light touch bilaterally.  Skin: Warm, dry and intact. No rash, cyanosis, or clubbing.  Psychiatric: Normal mood and affect. speech and behavior is normal. Judgment and thought content normal. Cognition and memory are normal.   Assessment & Plan:

## 2012-10-29 NOTE — Assessment & Plan Note (Signed)
Counseled on smoking cessation. Hand out given
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
## 2012-10-30 LAB — LEVETIRACETAM LEVEL: Keppra (Levetiracetam): 26.5 ug/mL (ref 5.0–30.0)

## 2012-11-01 ENCOUNTER — Other Ambulatory Visit: Payer: Self-pay | Admitting: Internal Medicine

## 2012-11-19 ENCOUNTER — Ambulatory Visit (INDEPENDENT_AMBULATORY_CARE_PROVIDER_SITE_OTHER): Payer: Medicaid Other | Admitting: Internal Medicine

## 2012-11-19 ENCOUNTER — Encounter: Payer: Self-pay | Admitting: Internal Medicine

## 2012-11-19 VITALS — BP 138/92 | HR 84 | Temp 98.3°F | Ht 60.0 in | Wt 103.0 lb

## 2012-11-19 DIAGNOSIS — R569 Unspecified convulsions: Secondary | ICD-10-CM

## 2012-11-19 LAB — BASIC METABOLIC PANEL
BUN: 15 mg/dL (ref 6–23)
CO2: 29 mEq/L (ref 19–32)
Calcium: 10.2 mg/dL (ref 8.4–10.5)
Chloride: 97 mEq/L (ref 96–112)
Creat: 0.65 mg/dL (ref 0.50–1.10)
Glucose, Bld: 92 mg/dL (ref 70–99)

## 2012-11-19 MED ORDER — HYDROCHLOROTHIAZIDE 25 MG PO TABS: 25.0000 mg | ORAL_TABLET | Freq: Every day | ORAL | Status: DC

## 2012-11-19 MED ORDER — LEVETIRACETAM 1000 MG PO TABS: 1000.0000 mg | ORAL_TABLET | Freq: Two times a day (BID) | ORAL | Status: DC

## 2012-11-19 NOTE — Assessment & Plan Note (Addendum)
BP Readings from Last 3 Encounters:  11/19/12 138/92  10/29/12 145/89  11/03/11 144/82    Lab Results  Component Value Date   NA 139 10/29/2012   K 4.4 10/29/2012   CREATININE 0.64 10/29/2012    Assessment:  Blood pressure control: improved on HCTZ but pt states continued stress    Progress toward BP goal: improved    Comments:   Plan:  Medications:  continue current medications  Educational resources provided:    Self management tools provided:    Other plans: will continue witll hctz 25 mg qd, check Bmet today for potassium level, encouraged smoking cessation

## 2012-11-19 NOTE — Patient Instructions (Addendum)
General Instructions:  We will check your blood work today. We will contact you if you need additional medications. Return in 1 month for another blood pressure check.  If you are still having sweats, etc when taking HCTZ will consider change to a different medication. Continue to cut down then stop smoking all together.  Treatment Goals:  Goals (1 Years of Data) as of 11/19/12         As of Today 10/29/12 11/03/11     Blood Pressure    . Blood Pressure < 140/90  138/92 145/89 144/82      Progress Toward Treatment Goals:  Treatment Goal 11/19/2012  Stop smoking smoking the same amount    Self Care Goals & Plans:  Self Care Goal 11/19/2012  Manage my medications take my medicines as prescribed; bring my medications to every visit  Monitor my health -  Eat healthy foods drink diet soda or water instead of juice or soda; eat more vegetables; eat foods that are low in salt  Stop smoking -  Other -       Care Management & Community Referrals:

## 2012-11-19 NOTE — Progress Notes (Signed)
  Subjective:    Patient ID: Shelia Arnold, female    DOB: Oct 11, 1965, 47 y.o.   MRN: [id number]  HPI Presents for bp recheck after starting HCTZ 25 mg qd. States that medicine makes her sweat soon after taling it.  Keppra was increased to 1000mg  bid.  Pt states that she  Has had no seizure-like activity of biting her lip or mouth twisting.  Otherwise without complaints.   Review of Systems As per HPI    Objective:   Physical Exam  Constitutional: She is oriented to person, place, and time. She appears well-developed and well-nourished. No distress.  HENT:  Head: Normocephalic and atraumatic.  Eyes: Conjunctivae and EOM are normal. Pupils are equal, round, and reactive to light.  Neck: Normal range of motion. Neck supple.  Cardiovascular: Normal rate, regular rhythm, normal heart sounds and intact distal pulses.   Pulmonary/Chest: Effort normal and breath sounds normal.  Abdominal: Soft. Bowel sounds are normal.  Musculoskeletal: Normal range of motion.  Neurological: She is alert and oriented to person, place, and time.  Skin: Skin is warm and dry.  Psychiatric: She has a normal mood and affect.          Assessment & Plan:  1. Htn: better control on HCTZ, will cont with this medication and monitor sweating episodes as pt is hesitent to try a drug that may not be on [facility] $4 plan.  2. Seizure d/o: controlled on keppra 1000 mg bid.

## 2012-12-01 ENCOUNTER — Other Ambulatory Visit: Payer: Self-pay | Admitting: Internal Medicine

## 2012-12-17 ENCOUNTER — Ambulatory Visit (INDEPENDENT_AMBULATORY_CARE_PROVIDER_SITE_OTHER): Payer: Medicaid Other | Admitting: Internal Medicine

## 2012-12-17 ENCOUNTER — Encounter: Payer: Self-pay | Admitting: Internal Medicine

## 2012-12-17 VITALS — BP 137/93 | HR 86 | Temp 97.8°F | Ht 60.0 in | Wt 104.8 lb

## 2012-12-17 DIAGNOSIS — E876 Hypokalemia: Secondary | ICD-10-CM

## 2012-12-17 DIAGNOSIS — I10 Essential (primary) hypertension: Secondary | ICD-10-CM

## 2012-12-17 DIAGNOSIS — F17200 Nicotine dependence, unspecified, uncomplicated: Secondary | ICD-10-CM

## 2012-12-17 DIAGNOSIS — R569 Unspecified convulsions: Secondary | ICD-10-CM

## 2012-12-17 LAB — BASIC METABOLIC PANEL
BUN: 15 mg/dL (ref 6–23)
Calcium: 10.1 mg/dL (ref 8.4–10.5)
Chloride: 97 mEq/L (ref 96–112)
Creat: 0.65 mg/dL (ref 0.50–1.10)

## 2012-12-17 MED ORDER — LEVETIRACETAM 1000 MG PO TABS: 1000.0000 mg | ORAL_TABLET | Freq: Two times a day (BID) | ORAL | Status: DC

## 2012-12-17 NOTE — Assessment & Plan Note (Signed)
BP Readings from Last 3 Encounters:  12/17/12 137/93  11/19/12 138/92  10/29/12 145/89    Lab Results  Component Value Date   NA 135 11/19/2012   K 3.4* 11/19/2012   CREATININE 0.65 11/19/2012    Assessment:  Blood pressure control: controlled  Progress toward BP goal:  at goal  Comments: states shakiness from hctz stable and manageable, would prefer to continue  Plan:  Medications:  continue current medications  Educational resources provided:    Self management tools provided:    Other plans: cont hctz 25 mg qd, repeat bmet today as last K 3.4 but pt states that she has since been eating bananas, will prescribe KDur 20 mEq qd if still low

## 2012-12-17 NOTE — Progress Notes (Signed)
  Subjective:    Patient ID: Shelia Arnold, female    DOB: 1966-04-21, 47 y.o.   MRN: [id number]  HPI Pt presents today for bp recheck after starting HCTZ 25 mg qd.  Potassium level mildly low at 3.4 a month ago since starting diuretic therapy but pt states that she has since been eating bananas as advised previously.  Bp today under better control 137/93 pulse 86 bpm.  Pt has brought bp log from home recordings which only have systolic measurements but all are 129-136.   Review of Systems  Constitutional: Negative for fever and fatigue.  HENT: Negative for congestion.   Eyes: Negative for visual disturbance.  Respiratory: Negative for chest tightness and shortness of breath.   Cardiovascular: Negative for chest pain.  Endocrine: Negative for polydipsia and polyuria.  Genitourinary: Negative for dysuria.  Skin: Negative for rash.  Neurological: Negative for seizures, syncope, weakness, light-headedness and headaches.       States that she sometimes has tingling in her right thigh  Psychiatric/Behavioral: Negative for dysphoric mood.       Objective:   Physical Exam  Constitutional: She is oriented to person, place, and time. She appears well-developed and well-nourished. No distress.  HENT:  Head: Normocephalic and atraumatic.  Eyes: Conjunctivae and EOM are normal. Pupils are equal, round, and reactive to light.  Neck: Normal range of motion. Neck supple. No thyromegaly present.  Cardiovascular: Normal rate, regular rhythm, normal heart sounds and intact distal pulses.   Pulmonary/Chest: Effort normal and breath sounds normal.  Abdominal: Soft. Bowel sounds are normal. There is no tenderness.  Musculoskeletal: She exhibits no edema and no tenderness.  Neurological: She is alert and oriented to person, place, and time. She has normal reflexes.  Skin: Skin is warm and dry.  Psychiatric: She has a normal mood and affect. Her behavior is normal. Judgment and thought content  normal.          Assessment & Plan:  1. Hypertension: controlled on hctz 25 mg qd -cont current regimen  2. Seizure d/o: stable w/o recent episodes, on Keppra 1000mg  bid -refilled Keppra  3. Hypokalemia: K 3.4 on last check -repeat bmet, if K still low will add KDur 20 mEg qd  4. Prevent Health: declines flu shot again

## 2012-12-17 NOTE — Patient Instructions (Addendum)
General Instructions:  Your blood pressure is under good control today. We will continue the HCTZ (hydrochlorothiazide) medication at 25 mg a day. We will check your potassium again today since it was a little low last time and you have since started eating bananas. It is good that you are considering moving from your stressful situation. This will help your blood pressure as well. I have refilled your seizure medication.  If you change your mind about getting the flu shot, let the clinic know and we will be happy to provide it for you. Return to see me in 6 months or sooner if needed.  Treatment Goals:  Goals (1 Years of Data) as of 12/17/12         As of Today 11/19/12 10/29/12 11/03/11     Blood Pressure    . Blood Pressure < 140/90  137/93 138/92 145/89 144/82      Progress Toward Treatment Goals:  Treatment Goal 12/17/2012  Blood pressure at goal  Stop smoking smoking the same amount    Self Care Goals & Plans:  Self Care Goal 12/17/2012  Manage my medications bring my medications to every visit  Monitor my health keep track of my blood glucose  Eat healthy foods -  Stop smoking call [facility] (1-800-QUIT-NOW)  Other -       Care Management & Community Referrals:

## 2013-03-28 ENCOUNTER — Other Ambulatory Visit: Payer: Self-pay | Admitting: Internal Medicine

## 2013-04-25 ENCOUNTER — Other Ambulatory Visit: Payer: Self-pay | Admitting: *Deleted

## 2013-04-26 ENCOUNTER — Other Ambulatory Visit: Payer: Self-pay | Admitting: *Deleted

## 2013-04-30 MED ORDER — DICLOFENAC SODIUM 1 % TD GEL: 2.0000 g | Freq: Four times a day (QID) | TRANSDERMAL | Status: DC

## 2013-05-02 MED ORDER — DICLOFENAC EPOLAMINE 1.3 % TD PTCH: MEDICATED_PATCH | TRANSDERMAL | Status: DC

## 2013-05-02 NOTE — Telephone Encounter (Signed)
Pls sch Sep or OCt appt with Dr Bosie Clos

## 2013-05-15 ENCOUNTER — Other Ambulatory Visit: Payer: Self-pay | Admitting: Internal Medicine

## 2013-05-15 DIAGNOSIS — Z1231 Encounter for screening mammogram for malignant neoplasm of breast: Secondary | ICD-10-CM

## 2013-06-12 ENCOUNTER — Ambulatory Visit (HOSPITAL_COMMUNITY)
Admission: RE | Admit: 2013-06-12 | Discharge: 2013-06-12 | Disposition: A | Discharge status: Home / Self Care | Payer: Medicaid Other | Source: Ambulatory Visit | Attending: Internal Medicine | Admitting: Internal Medicine

## 2013-06-12 DIAGNOSIS — Z1231 Encounter for screening mammogram for malignant neoplasm of breast: Secondary | ICD-10-CM | POA: Insufficient documentation

## 2013-06-19 ENCOUNTER — Other Ambulatory Visit: Payer: Self-pay | Admitting: Internal Medicine

## 2013-06-26 ENCOUNTER — Other Ambulatory Visit: Payer: Self-pay | Admitting: Internal Medicine

## 2013-07-08 ENCOUNTER — Ambulatory Visit (INDEPENDENT_AMBULATORY_CARE_PROVIDER_SITE_OTHER): Payer: Medicaid Other | Admitting: Internal Medicine

## 2013-07-08 ENCOUNTER — Other Ambulatory Visit (HOSPITAL_COMMUNITY)
Admission: RE | Admit: 2013-07-08 | Discharge: 2013-07-08 | Disposition: A | Discharge status: Home / Self Care | Payer: Medicaid Other | Source: Ambulatory Visit | Attending: Internal Medicine | Admitting: Internal Medicine

## 2013-07-08 ENCOUNTER — Encounter: Payer: Self-pay | Admitting: Internal Medicine

## 2013-07-08 VITALS — BP 127/84 | HR 88 | Temp 97.9°F | Ht 60.5 in | Wt 111.3 lb

## 2013-07-08 DIAGNOSIS — IMO0001 Reserved for inherently not codable concepts without codable children: Secondary | ICD-10-CM

## 2013-07-08 DIAGNOSIS — M545 Low back pain, unspecified: Secondary | ICD-10-CM

## 2013-07-08 DIAGNOSIS — I10 Essential (primary) hypertension: Secondary | ICD-10-CM

## 2013-07-08 DIAGNOSIS — R87810 Cervical high risk human papillomavirus (HPV) DNA test positive: Secondary | ICD-10-CM | POA: Insufficient documentation

## 2013-07-08 DIAGNOSIS — M7918 Myalgia, other site: Secondary | ICD-10-CM

## 2013-07-08 DIAGNOSIS — Z01419 Encounter for gynecological examination (general) (routine) without abnormal findings: Secondary | ICD-10-CM | POA: Insufficient documentation

## 2013-07-08 DIAGNOSIS — Z0000 Encounter for general adult medical examination without abnormal findings: Secondary | ICD-10-CM

## 2013-07-08 DIAGNOSIS — R519 Headache, unspecified: Secondary | ICD-10-CM

## 2013-07-08 DIAGNOSIS — F17200 Nicotine dependence, unspecified, uncomplicated: Secondary | ICD-10-CM

## 2013-07-08 DIAGNOSIS — R569 Unspecified convulsions: Secondary | ICD-10-CM

## 2013-07-08 DIAGNOSIS — R300 Dysuria: Secondary | ICD-10-CM

## 2013-07-08 DIAGNOSIS — Z1151 Encounter for screening for human papillomavirus (HPV): Secondary | ICD-10-CM | POA: Insufficient documentation

## 2013-07-08 DIAGNOSIS — R51 Headache: Secondary | ICD-10-CM

## 2013-07-08 MED ORDER — LEVETIRACETAM 1000 MG PO TABS: 1000.0000 mg | ORAL_TABLET | Freq: Two times a day (BID) | ORAL | Status: DC

## 2013-07-08 MED ORDER — AMITRIPTYLINE HCL 25 MG PO TABS: 25.0000 mg | ORAL_TABLET | Freq: Every day | ORAL | Status: DC

## 2013-07-08 MED ORDER — HYDROCHLOROTHIAZIDE 25 MG PO TABS: ORAL_TABLET | ORAL | Status: DC

## 2013-07-08 MED ORDER — ACETAMINOPHEN 325 MG PO TABS: 650.0000 mg | ORAL_TABLET | Freq: Four times a day (QID) | ORAL | Status: DC | PRN

## 2013-07-08 NOTE — Assessment & Plan Note (Signed)
Managed with flector patch

## 2013-07-08 NOTE — Assessment & Plan Note (Signed)
Recommend Tylenol 650 mg q6h prn headache

## 2013-07-08 NOTE — Progress Notes (Signed)
  Subjective:    Patient ID: Shelia Arnold, female    DOB: 19-Mar-1966, 47 y.o.   MRN: [id number]  HPI  Shelia Arnold is a 47 yo female with PMHx is significant for seizure disorder managed on Keppra, migraines only managed with rest, and low back pain managed with pain patch and voltaren gel. She pesents today for f/u of hypertension and to have PAP smear.  States that she still have "little seizures" sometimes but does not miss doses of Keppra.  Reports that she gets headaches regularly and would like to "try something" for them but does not know what to take thus "just lays down" until it goes away. Reports some occasional burning on urination but no strong smell or pain and thinks that she may be "wiping to hard". Other wise without complaints.  Review of Systems  Constitutional: Negative for fever and fatigue.  HENT: Negative for congestion.   Eyes: Negative for visual disturbance.  Respiratory: Negative for shortness of breath.   Cardiovascular: Negative for chest pain and leg swelling.  Genitourinary: Positive for dysuria and hematuria. Negative for vaginal bleeding and pelvic pain.  Musculoskeletal: Positive for back pain.  Skin: Negative for rash.  Neurological: Positive for seizures and headaches. Negative for dizziness and weakness.  Psychiatric/Behavioral: Negative for dysphoric mood.       Objective:   Physical Exam  Constitutional: She is oriented to person, place, and time. She appears well-developed and well-nourished.  HENT:  Head: Normocephalic and atraumatic.  Eyes: Conjunctivae are normal. Pupils are equal, round, and reactive to light.  Neck: Normal range of motion. Neck supple. No thyromegaly present.  Cardiovascular: Normal rate, regular rhythm and normal heart sounds.   Pulmonary/Chest: Effort normal and breath sounds normal. No respiratory distress.  Abdominal: Soft. Bowel sounds are normal. She exhibits no distension and no mass. There is no tenderness.   Genitourinary: Uterus normal. Vaginal discharge found.  Milky white thin secretions on vaginal exam, no odor appreciated  Musculoskeletal: Normal range of motion. She exhibits no edema and no tenderness.  Neurological: She is alert and oriented to person, place, and time.  Skin: Skin is warm and dry.  Psychiatric: She has a normal mood and affect.          Assessment & Plan:  See problem-list charting for details:  1. Hypertension: at goal and stable on HCTZ 25 mg qd 2. Seizure disorder: at baseline on Keppra 1000 mg bid 3. Headaches: several times a week, has not been taking anything for them -advised Tylenol 650 mg qrh prn 4. Back pain: stable on pain patch and voltaren gel 5. Preventative Health: Mammo normal 05/2013, declines flu and TDap, Pap today 6. Tobacco abuse: not willing to stop yet

## 2013-07-08 NOTE — Assessment & Plan Note (Signed)
  Assessment: Progress toward smoking cessation:  smoking the same amount Barriers to progress toward smoking cessation:  stress Comments: not motivated as of yet  Plan: Instruction/counseling given:  I counseled patient on the dangers of tobacco use, advised patient to stop smoking, and reviewed strategies to maximize success. Educational resources provided:  [facility] [employer]) brochure Self management tools provided:    Medications to assist with smoking cessation:  None Patient agreed to the following self-care plans for smoking cessation:    Other plans: cont to counsel at next visit

## 2013-07-08 NOTE — Assessment & Plan Note (Signed)
PAP smear done today Mammogram completed Sept 2014 with benign findings Declines flu shot and tetanus

## 2013-07-08 NOTE — Assessment & Plan Note (Signed)
BP Readings from Last 3 Encounters:  07/08/13 127/84  12/17/12 137/93  11/19/12 138/92    Lab Results  Component Value Date   NA 136 12/17/2012   K 3.6 12/17/2012   CREATININE 0.65 12/17/2012    Assessment: Blood pressure control: controlled Progress toward BP goal:  at goal Comments:   Plan: Medications:  continue current medications Educational resources provided: brochure;handout;video Self management tools provided: home blood pressure logbook Other plans: cont HCTZ 25 mg qd

## 2013-07-08 NOTE — Assessment & Plan Note (Signed)
Controlled with voltaren gel  

## 2013-07-08 NOTE — Patient Instructions (Signed)
General Instructions: You blood pressure is under good control. Continue to take your blood pressure medicine. For your headache, start taking 650 mg of acetaminophen (Tylenol every 6 hours as needed for headache) For your burning urine, we will check for infection today. If there is need for medication, we will contact you. Your Pap Smear was done today and your Mammogram from September is normal.   Treatment Goals:  Goals (1 Years of Data) as of 07/08/13         As of Today 12/17/12 11/19/12 10/29/12 11/03/11     Blood Pressure    . Blood Pressure < 140/90  127/84 137/93 138/92 145/89 144/82      Progress Toward Treatment Goals:  Treatment Goal 07/08/2013  Blood pressure at goal  Stop smoking smoking the same amount    Self Care Goals & Plans:  Self Care Goal 07/08/2013  Manage my medications take my medicines as prescribed; bring my medications to every visit; refill my medications on time; follow the sick day instructions if I am sick  Monitor my health keep track of my blood pressure  Eat healthy foods eat more vegetables; eat fruit for snacks and desserts; eat baked foods instead of fried foods; eat foods that are low in salt; eat smaller portions  Be physically active find an activity I enjoy; take a walk every day  Stop smoking -  Other -    No flowsheet data found.   Care Management & Community Referrals:  No flowsheet data found.

## 2013-07-09 LAB — URINALYSIS, ROUTINE W REFLEX MICROSCOPIC
Bilirubin Urine: NEGATIVE
Glucose, UA: NEGATIVE mg/dL
Hgb urine dipstick: NEGATIVE
Nitrite: NEGATIVE
Specific Gravity, Urine: 1.012 (ref 1.005–1.030)

## 2013-07-11 ENCOUNTER — Other Ambulatory Visit: Payer: Self-pay | Admitting: Internal Medicine

## 2013-07-11 DIAGNOSIS — A599 Trichomoniasis, unspecified: Secondary | ICD-10-CM | POA: Insufficient documentation

## 2013-07-11 DIAGNOSIS — B977 Papillomavirus as the cause of diseases classified elsewhere: Secondary | ICD-10-CM

## 2013-07-11 MED ORDER — METRONIDAZOLE 500 MG PO TABS: 2000.0000 mg | ORAL_TABLET | Freq: Once | ORAL | Status: AC

## 2013-07-11 NOTE — Assessment & Plan Note (Signed)
Will treat with metronidazole 2 g x1.

## 2013-07-11 NOTE — Assessment & Plan Note (Signed)
Will refer back to [facility] for counseling and monitoring.

## 2013-07-11 NOTE — Progress Notes (Signed)
Case discussed with Dr. Schooler soon after the resident saw the patient.  We reviewed the resident's history and exam and pertinent patient test results.  I agree with the assessment, diagnosis, and plan of care documented in the resident's note. 

## 2013-07-12 ENCOUNTER — Telehealth: Payer: Self-pay | Admitting: Internal Medicine

## 2013-07-12 NOTE — Telephone Encounter (Signed)
Patient called in stating that she was scared to take the metronidazole prescribed for an infection by her doctor as she also takes the keppra and she was worried. Did look up for interactions between the two medications while we were talking and there was not one between the two. Advised her that she should take the metronidazole as prescribed and that she should not have any problems with the keppra.   She was in agreement and will take the medicines.   Genella Mech 6:47 AM 07/12/2013

## 2013-07-18 ENCOUNTER — Other Ambulatory Visit: Payer: Self-pay | Admitting: Internal Medicine

## 2013-07-22 ENCOUNTER — Other Ambulatory Visit: Payer: Self-pay | Admitting: Internal Medicine

## 2013-08-20 ENCOUNTER — Other Ambulatory Visit: Payer: Self-pay | Admitting: Internal Medicine

## 2013-09-20 ENCOUNTER — Other Ambulatory Visit: Payer: Self-pay | Admitting: Internal Medicine

## 2013-09-24 NOTE — Telephone Encounter (Signed)
Pt informed of refills.

## 2013-10-07 ENCOUNTER — Other Ambulatory Visit: Payer: Self-pay | Admitting: Internal Medicine

## 2013-10-09 ENCOUNTER — Encounter: Payer: Medicaid Other | Admitting: Obstetrics & Gynecology

## 2013-10-11 ENCOUNTER — Other Ambulatory Visit: Payer: Self-pay | Admitting: Internal Medicine

## 2013-10-13 ENCOUNTER — Other Ambulatory Visit: Payer: Self-pay | Admitting: Internal Medicine

## 2013-10-13 DIAGNOSIS — M545 Low back pain, unspecified: Secondary | ICD-10-CM

## 2013-10-15 NOTE — Addendum Note (Signed)
Addended by: Hulan Fray on: 10/15/2013 08:28 PM   Modules accepted: Orders

## 2013-10-17 ENCOUNTER — Other Ambulatory Visit: Payer: Self-pay | Admitting: Internal Medicine

## 2013-11-07 ENCOUNTER — Other Ambulatory Visit: Payer: Self-pay | Admitting: Internal Medicine

## 2013-11-19 ENCOUNTER — Other Ambulatory Visit: Payer: Self-pay | Admitting: *Deleted

## 2013-11-20 MED ORDER — DICLOFENAC EPOLAMINE 1.3 % TD PTCH: MEDICATED_PATCH | TRANSDERMAL | Status: DC

## 2013-11-20 MED ORDER — HYDROCHLOROTHIAZIDE 25 MG PO TABS: ORAL_TABLET | ORAL | Status: DC

## 2013-12-16 ENCOUNTER — Other Ambulatory Visit: Payer: Self-pay | Admitting: Internal Medicine

## 2014-01-21 ENCOUNTER — Other Ambulatory Visit: Payer: Self-pay | Admitting: Internal Medicine

## 2014-01-23 MED ORDER — HYDROCHLOROTHIAZIDE 25 MG PO TABS: ORAL_TABLET | ORAL | Status: DC

## 2014-03-13 ENCOUNTER — Encounter: Payer: Self-pay | Admitting: Obstetrics & Gynecology

## 2014-03-19 ENCOUNTER — Other Ambulatory Visit: Payer: Self-pay | Admitting: *Deleted

## 2014-03-19 MED ORDER — LEVETIRACETAM 1000 MG PO TABS: ORAL_TABLET | ORAL | Status: DC

## 2014-03-19 NOTE — Telephone Encounter (Signed)
Needs F/U appt with PCP

## 2014-03-19 NOTE — Telephone Encounter (Signed)
Flag sent to front desk pool regarding appt.

## 2014-04-14 ENCOUNTER — Ambulatory Visit (INDEPENDENT_AMBULATORY_CARE_PROVIDER_SITE_OTHER): Payer: Medicaid Other | Admitting: Internal Medicine

## 2014-04-14 ENCOUNTER — Ambulatory Visit (HOSPITAL_COMMUNITY)
Admission: RE | Admit: 2014-04-14 | Discharge: 2014-04-14 | Disposition: A | Discharge status: Home / Self Care | Payer: Medicaid Other | Source: Ambulatory Visit | Attending: Internal Medicine | Admitting: Internal Medicine

## 2014-04-14 ENCOUNTER — Encounter: Payer: Self-pay | Admitting: Internal Medicine

## 2014-04-14 VITALS — BP 135/85 | HR 78 | Temp 98.3°F | Ht 60.0 in | Wt 112.2 lb

## 2014-04-14 DIAGNOSIS — R51 Headache: Secondary | ICD-10-CM

## 2014-04-14 DIAGNOSIS — M545 Low back pain, unspecified: Secondary | ICD-10-CM | POA: Diagnosis not present

## 2014-04-14 DIAGNOSIS — G40909 Epilepsy, unspecified, not intractable, without status epilepticus: Secondary | ICD-10-CM

## 2014-04-14 DIAGNOSIS — G44229 Chronic tension-type headache, not intractable: Secondary | ICD-10-CM

## 2014-04-14 DIAGNOSIS — I10 Essential (primary) hypertension: Secondary | ICD-10-CM

## 2014-04-14 DIAGNOSIS — I7090 Unspecified atherosclerosis: Secondary | ICD-10-CM | POA: Insufficient documentation

## 2014-04-14 DIAGNOSIS — R569 Unspecified convulsions: Secondary | ICD-10-CM

## 2014-04-14 LAB — BASIC METABOLIC PANEL WITH GFR
BUN: 18 mg/dL (ref 6–23)
CALCIUM: 10.3 mg/dL (ref 8.4–10.5)
CHLORIDE: 100 meq/L (ref 96–112)
CO2: 28 meq/L (ref 19–32)
Creat: 0.67 mg/dL (ref 0.50–1.10)
GFR, Est African American: >89 mL/min
GFR, Est Non African American: >89 mL/min
Glucose, Bld: 84 mg/dL (ref 70–99)
Potassium: 4.2 mEq/L (ref 3.5–5.3)
SODIUM: 138 meq/L (ref 135–145)

## 2014-04-14 LAB — CBC
HEMATOCRIT: 43.8 % (ref 36.0–46.0)
Hemoglobin: 15.3 g/dL — ABNORMAL HIGH (ref 12.0–15.0)
MCH: 33 pg (ref 26.0–34.0)
MCHC: 34.9 g/dL (ref 30.0–36.0)
MCV: 94.6 fL (ref 78.0–100.0)
PLATELETS: 282 10*3/uL (ref 150–400)
RBC: 4.63 MIL/uL (ref 3.87–5.11)
RDW: 13.8 % (ref 11.5–15.5)
WBC: 7.4 10*3/uL (ref 4.0–10.5)

## 2014-04-14 MED ORDER — LEVETIRACETAM 1000 MG PO TABS: ORAL_TABLET | ORAL | Status: DC

## 2014-04-14 NOTE — Patient Instructions (Signed)
We have increased your Keppra dosing to 1500 mg twice a day. Please take one and a half tablets twice a day.  We have also ordered several blood tests and a lumbar (lower back) x-ray to evaluate for arthritis or other sources of your back pain. We will contact you with the results.

## 2014-04-14 NOTE — Assessment & Plan Note (Signed)
Still having occasional headaches that are consistent with her chronic symptomatology. Still has not taken any medications during these episodes.   - Again advised patient that she could trial acetaminophen.

## 2014-04-14 NOTE — Progress Notes (Signed)
   Subjective:    Patient ID: Shelia Arnold, female    DOB: [birth year], 48 y.o.   MRN: [id number]  HPI  Shelia Arnold is a 48 year old female with history of seizures, hypertension, and low back pain who presents to clinic for a routine followup.  The patient reports that her chronic back pain has been slightly getting worse over the last year. It is associated with bilateral diffuse leg pain not limited to her joints, and the pain prevents her from walking any more than one city block at a time without rest. She also mentions that in the morning, she has some trouble getting into the bathtub due to some stiffness. She reports that the flector patch tends to fall off of her back and legs, so she does not use that. However, she does use the Voltaren gel which helps with her symptoms to some degree. She denies any significant muscular pain with exertion, denies any changes in sensation in in her legs, fevers or chills, or loss of bowel or bladder function.  Additionally, she mentions that she had an episode consistent with her previous and lifelong seizures with loss of consciousness and tongue biting in April. Overall, she mentions that she has had 5-6 of these episodes in the last year. She does express some reticence in potentially increasing her dosage of Keppra, not due to any adverse reactions but mostly to the large dosage in and of itself.  Otherwise, she mentions that her chronic headaches have been stable, though she still does not take any medications to help with the symptoms. Her blood pressures have also been stable, ranging in the 829H to 371 systolic. She still smokes half a pack of cigarettes a day, and is still attempting to reduce this.   Review of Systems  Constitutional: Negative for appetite change, fatigue and unexpected weight change.  HENT: Negative for sore throat.   Eyes: Negative for visual disturbance.  Respiratory: Negative for cough and shortness of breath.    Cardiovascular: Positive for chest pain ( 8/10, 2-3 times a week, non-exertional, elicited with palpation, occasionally pleuritic, ) and leg swelling.  Gastrointestinal: Negative for vomiting, abdominal pain and diarrhea.  Neurological: Positive for dizziness, seizures and syncope.  Psychiatric/Behavioral: Negative for sleep disturbance.       Objective:   Physical Exam  HENT:  Head: Normocephalic.  Neck: Normal range of motion. No tracheal deviation present. No thyromegaly present.  Cardiovascular: Normal rate and regular rhythm.  Exam reveals no gallop and no friction rub.   No murmur heard. Pulmonary/Chest: Effort normal and breath sounds normal. No respiratory distress. She has no wheezes. She has no rales.  Abdominal: Soft. She exhibits no distension. There is no tenderness.  Musculoskeletal: She exhibits tenderness ( right dorsoflexion).  Neurological: 4+ to 5 strength in bilateral lower extremities. 5 out of 5 strength in bilateral upper extremities.        Assessment & Plan:  Please see problem based charting for more details.

## 2014-04-14 NOTE — Assessment & Plan Note (Addendum)
Patient reporting slightly worse symptoms of back pain that is associated with bilateral diffuse lower extremity pain along with these limited ambulation to less than one city block. She has previously been diagnosed with osteoarthritis, though her level of ambulation at her age is concerning for other etiologies for her pain and functional limitation. Although osteoarthritis is the most likely diagnosis, other diagnoses worth ruling out could also include ankylosing spondylitis, peripheral neuropathy.  - CBC, BMET, vitamin B12,  - Lumbar spine radiographs.  Addendum 04/15/14: No evidence of degenerative lumbar disease on radiograph 04/14/14. Labs on the same date also show no evidence of inflammation or vitamin deficiency. Consider further workup at next visit based on symptom progression.

## 2014-04-14 NOTE — Assessment & Plan Note (Signed)
BP Readings from Last 3 Encounters:  04/14/14 135/85  07/08/13 127/84  12/17/12 137/93    Lab Results  Component Value Date   NA 136 12/17/2012   K 3.6 12/17/2012   CREATININE 0.65 12/17/2012    Assessment: Blood pressure control: controlled Progress toward BP goal:  at goal Comments: patient on hydrochlorothiazide 25 mg monotherapy  Plan: Medications:  continue current medications Educational resources provided: brochure Self management tools provided: home blood pressure logbook Other plans:

## 2014-04-14 NOTE — Assessment & Plan Note (Signed)
Lifelong seizure disorder that has shown some exacerbation within the last year with 4-5 episodes, the last one in April.  - Increase patient's Keppra to 1500 mg twice a day (from 1000 mg twice a day).

## 2014-04-15 ENCOUNTER — Encounter: Payer: Self-pay | Admitting: Internal Medicine

## 2014-04-15 ENCOUNTER — Telehealth: Payer: Self-pay | Admitting: Internal Medicine

## 2014-04-15 LAB — SEDIMENTATION RATE: Sed Rate: 1 mm/hr (ref 0–22)

## 2014-04-15 LAB — VITAMIN B12: Vitamin B-12: 1036 pg/mL — ABNORMAL HIGH (ref 211–911)

## 2014-04-15 NOTE — Telephone Encounter (Signed)
Patient contacted about normal BMP, CBC, vitamin B12, and lumbar spine radiograph. Patient notified that there was no evidence of degenerative disease that would be consistent with osteoarthritis or any other findings that would be suggestive of any other inflammatory processes.   Given the non-acute progression of her weakness, she was advised to return to clinic in 2 months, at which point, we can decide on potential further workup based on her progression.

## 2014-04-15 NOTE — Progress Notes (Signed)
I saw and evaluated the patient.  I personally confirmed the key portions of the history and exam documented by Dr. Raelene Bott and I reviewed pertinent patient test results.  The assessment, diagnosis, and plan were formulated together and I agree with the documentation in the resident's note. Shelia Arnold is a very pleasant female. She is hesitant to increase her Keppra bc 1000 mg already seems like such a big number to her. Dr Raelene Bott reassured her that 1000 BID is a sub therapeutic dose and 1500 BID is the rec maintenance dose. Pt does desire not to have further sz and stated she would increase dose. The etiology of her back and leg pain is not clear. She is able to stand without using arms. She has decreased effort when testing her LE strength B but with coaching, I believe her LE strength is nl B. She has never had W/U of her pain so Dr Raelene Bott is starting basic W/U.

## 2014-05-21 ENCOUNTER — Other Ambulatory Visit: Payer: Self-pay

## 2014-05-21 DIAGNOSIS — Z1231 Encounter for screening mammogram for malignant neoplasm of breast: Secondary | ICD-10-CM

## 2014-05-30 ENCOUNTER — Other Ambulatory Visit: Payer: Self-pay | Admitting: *Deleted

## 2014-05-30 MED ORDER — AMITRIPTYLINE HCL 25 MG PO TABS: ORAL_TABLET | ORAL | Status: DC

## 2014-06-13 ENCOUNTER — Ambulatory Visit
Admission: RE | Admit: 2014-06-13 | Discharge: 2014-06-13 | Disposition: A | Discharge status: Home / Self Care | Payer: Medicaid Other | Source: Ambulatory Visit

## 2014-06-13 DIAGNOSIS — Z1231 Encounter for screening mammogram for malignant neoplasm of breast: Secondary | ICD-10-CM

## 2014-06-25 ENCOUNTER — Encounter: Payer: Self-pay | Admitting: Internal Medicine

## 2014-06-25 ENCOUNTER — Ambulatory Visit (INDEPENDENT_AMBULATORY_CARE_PROVIDER_SITE_OTHER): Payer: Medicaid Other | Admitting: Internal Medicine

## 2014-06-25 VITALS — BP 131/84 | HR 71 | Temp 98.4°F | Ht 60.0 in | Wt 114.1 lb

## 2014-06-25 DIAGNOSIS — I10 Essential (primary) hypertension: Secondary | ICD-10-CM

## 2014-06-25 DIAGNOSIS — F17200 Nicotine dependence, unspecified, uncomplicated: Secondary | ICD-10-CM

## 2014-06-25 DIAGNOSIS — R569 Unspecified convulsions: Secondary | ICD-10-CM

## 2014-06-25 DIAGNOSIS — M545 Low back pain, unspecified: Secondary | ICD-10-CM

## 2014-06-25 DIAGNOSIS — Z0000 Encounter for general adult medical examination without abnormal findings: Secondary | ICD-10-CM

## 2014-06-25 MED ORDER — HYDROCHLOROTHIAZIDE 25 MG PO TABS: ORAL_TABLET | ORAL | Status: DC

## 2014-06-25 NOTE — Assessment & Plan Note (Signed)
BP Readings from Last 3 Encounters:  06/25/14 131/84  04/14/14 135/85  07/08/13 127/84    Lab Results  Component Value Date   NA 138 04/14/2014   K 4.2 04/14/2014   CREATININE 0.67 04/14/2014    Assessment: Blood pressure control: controlled Progress toward BP goal:  at goal Comments: Patient currently on hydrochlorothiazide 25 mg.  Plan: Medications:  continue current medications

## 2014-06-25 NOTE — Assessment & Plan Note (Signed)
Patient initially came to visit because of a message asking her to come for a Pap smear. Her last Pap smear was in 2014 with benign findings. Next Pap smear is due in 2017.  - Patient declines flu shot

## 2014-06-25 NOTE — Progress Notes (Signed)
   Subjective:    Patient ID: Shelia Arnold, female    DOB: May 29, 1966, 48 y.o.   MRN: [id number]  HPI  Shelia Arnold is a 48 year old woman with a history of bilateral leg weakness, seizures, and hypertension who presents for a routine follow up.  Please refer to separate problem-list charting for more details.  Review of Systems  Constitutional: Negative for fever and chills.  HENT: Negative for rhinorrhea and sore throat.   Eyes: Negative for visual disturbance.  Respiratory: Negative for cough and shortness of breath.   Cardiovascular: Negative for chest pain and palpitations.  Gastrointestinal: Negative for nausea, vomiting, abdominal pain, diarrhea, constipation and blood in stool.  Genitourinary: Negative for dysuria and hematuria.  Neurological: Negative for syncope.       Objective:   Physical Exam  Constitutional: She is oriented to person, place, and time. She appears well-developed and well-nourished. No distress.  HENT:  Head: Normocephalic and atraumatic.  Eyes: EOM are normal. Pupils are equal, round, and reactive to light. Left eye exhibits no discharge.  Neck: Normal range of motion. Neck supple. No thyromegaly present.  Cardiovascular: Normal rate and regular rhythm.  Exam reveals no gallop and no friction rub.   No murmur heard. Pulmonary/Chest: Effort normal and breath sounds normal. No respiratory distress. She has no wheezes. She has no rales.  Abdominal: Soft. Bowel sounds are normal. She exhibits no distension. There is no tenderness. There is no rebound.  Musculoskeletal: She exhibits no edema.  Neurological: She is alert and oriented to person, place, and time. No cranial nerve deficit.  5 out of 5 strength in bilateral upper and lower extremities except for right lower extremity which is 4+ out of 5 throughout. Sensation intact to light touch throughout all extremities. 2+ reflexes throughout all extremities. Finger to nose intact. Slow gait  though normal strength length without unsteadiness.   Skin: Skin is warm and dry. No rash noted.  Psychiatric: She has a normal mood and affect. Thought content normal.          Assessment & Plan:  Please refer to separate problem-list charting for more details.

## 2014-06-25 NOTE — Assessment & Plan Note (Signed)
Patient with a reported lifelong seizure disorder. Previously seen in clinic in July 2015 in the context of increased frequency of seizures, at that point reporting 4-5 episodes over a year. Patient's Keppra was increased from 1000 mg twice a day to 1500 mg twice a day. Patient reports that she has had 10 seizures since her last appointment about 2 months ago. He had a deep maximal dose of Keppra, patient's seizures are not well controlled.  - Referral to neurology.

## 2014-06-25 NOTE — Patient Instructions (Addendum)
We have placed a referral to neurology for your seizures. We will contact you over the phone with more details concerning this appointment.   General Instructions:   Please bring your medicines with you each time you come to clinic.  Medicines may include prescription medications, over-the-counter medications, herbal remedies, eye drops, vitamins, or other pills.   Progress Toward Treatment Goals:  Treatment Goal 06/25/2014  Blood pressure at goal  Stop smoking smoking the same amount    Self Care Goals & Plans:  Self Care Goal 04/14/2014  Manage my medications take my medicines as prescribed; bring my medications to every visit; refill my medications on time  Monitor my health -  Eat healthy foods drink diet soda or water instead of juice or soda; eat more vegetables; eat foods that are low in salt; eat baked foods instead of fried foods; eat fruit for snacks and desserts  Be physically active -  Stop smoking call [facility] (1-800-QUIT-NOW)  Other -    No flowsheet data found.   Care Management & Community Referrals:  No flowsheet data found.

## 2014-06-25 NOTE — Assessment & Plan Note (Signed)
  Assessment: Progress toward smoking cessation:  smoking the same amount Plan: Instruction/counseling given:  I counseled patient on the dangers of tobacco use, advised patient to stop smoking, and reviewed strategies to maximize success.

## 2014-06-25 NOTE — Assessment & Plan Note (Signed)
Patient reporting consistent symptoms of back pain that radiates down her right leg. She has recently been diagnosed with osteoarthritis, though her lumbar plain film from July 2015 showed no evidence of degenerative disease. Physical exam not concerning for any red flag signs. Patient ambulating better than last visit.   - Will continue to pursue symptomatic treatment with acetaminophen, flector patch, and ibuprofen.  - Will defer further workup at this point in preference for seizure management now.  - Consider further workup at next visit if patient's symptoms persist.

## 2014-06-27 NOTE — Progress Notes (Signed)
INTERNAL MEDICINE TEACHING ATTENDING ADDENDUM - Aldine Contes, MD: I personally saw and evaluated Ms. Bleich in this clinic visit in conjunction with the resident, Dr. Raelene Bott. I have discussed patient's plan of care with medical resident during this visit. I have confirmed the physical exam findings and have read and agree with the clinic note including the plan with the following addition: - Pt with increased seizure frequency despite titrating up her keppra.  - Will need outpatient neuro follow up - BP controlled. C/w current meds - pt counseled on smoking cessation

## 2014-07-09 ENCOUNTER — Other Ambulatory Visit: Payer: Self-pay | Admitting: *Deleted

## 2014-07-09 MED ORDER — DICLOFENAC EPOLAMINE 1.3 % TD PTCH: MEDICATED_PATCH | TRANSDERMAL | Status: DC

## 2014-07-30 ENCOUNTER — Ambulatory Visit (INDEPENDENT_AMBULATORY_CARE_PROVIDER_SITE_OTHER): Payer: Medicaid Other | Admitting: Neurology

## 2014-07-30 ENCOUNTER — Encounter: Payer: Self-pay | Admitting: Neurology

## 2014-07-30 VITALS — BP 122/80 | HR 80 | Ht 60.0 in | Wt 114.0 lb

## 2014-07-30 DIAGNOSIS — G40019 Localization-related (focal) (partial) idiopathic epilepsy and epileptic syndromes with seizures of localized onset, intractable, without status epilepticus: Secondary | ICD-10-CM

## 2014-07-30 NOTE — Patient Instructions (Signed)
1. Continue your current medication for now 2. Keep a calendar of your seizures 3. Follow-up in 2-3 months

## 2014-07-30 NOTE — Progress Notes (Signed)
NEUROLOGY CONSULTATION NOTE  Shelia Arnold MRN: [id number] DOB: 03/10/1966  Referring provider: Dr. Luan Moore Primary care provider: Dr. Luan Moore  Reason for consult:  seizures  Dear Dr Raelene Bott:  Thank you for your kind referral of Shelia Arnold for consultation of the above symptoms. Although her history is well known to you, please allow me to reiterate it for the purpose of our medical record. Records and images were personally reviewed where available.  HISTORY OF PRESENT ILLNESS: This is a 48 year old right-handed woman with a history of hypertension, back pain, bilateral leg pain, and seizures since childhood. She is a poor historian and mostly answers "I don't know" to questions. History is obtained from prior medical records on Epic. She had been evaluated at [facility] in 2009, note of alcohol abuse clouding the picture at that time. She reports that seizures started in childhood. She would have a warning of something coming on, her mouth would twist to the left, she sometimes feels herself shaking and has to hold on to the bed. She also endorses episodes of staring and unresponsiveness. Prior notes indicate a diagnosis of complex partial seizures. There is a note from 2008 indicating MRI brain showing age advanced atrophy and EEG that was negative. There is an EEG report done 06/2008 which was normal.  She had been on Dilantin, Tegretol in the past. She has been taking Keppra for at least 5-6 years, dose increased last July due to report of continued seizures. She has injured herself with the seizures, she has scars on her right arm and reports burning herself on the right thigh after she had a seizure while holding a hot iron. She tells me that her last seizure was a month ago, and that she has seizures every few months. Per PCP notes, she initially reported 4-5 seizures over a year, then reported 10 seizures since her appointment 2 months prior. She was very concerned about  the "high dose" of Keppra 1500mg  BID. She denies any side effects on this.    She reports headaches since childhood as well. Headaches start in the frontal region and radiate to the occipital region, with pressure sensation lasting several hours, occurring around once a week. There is no associated nausea, vomiting, photo or phonophobia. Sometimes she sees lines in her vision before the headaches. Headaches may worsen after the seizures. She takes Tylenol but this does not help, she would usually just lie down and sleep. She has a lot of neck and back pain, as well as bilateral sharp leg pain. She denies any diplopia, dysarthria, dysphagia, focal numbness/tingling/weakness, bowel/bladder dysfunction. She denies any olfactory/gustatory hallucinations, deja vu, rising epigastric sensation, focal numbness/tingling/weakness, myoclonic jerks. She takes Elavil 25mg  qhs for insomnia, usually getting 6-7 hours of sleep.   Epilepsy Risk Factors: She is a poor historian, but as far as she knows, she had a normal birth and early development.  There is no history of febrile convulsions, CNS infections such as meningitis/encephalitis, significant traumatic brain injury, neurosurgical procedures, or family history of seizures. Per notes, she dropped out of school in the 7th grade, placed in special classes.  Prior AEDs: Dilantin (elevated LFTs), Tegretol  Laboratory Data:  Lab Results  Component Value Date   WBC 7.4 04/14/2014   HGB 15.3* 04/14/2014   HCT 43.8 04/14/2014   MCV 94.6 04/14/2014   PLT 282 04/14/2014     Chemistry      Component Value Date/Time   NA 138 04/14/2014  1458   K 4.2 04/14/2014 1458   CL 100 04/14/2014 1458   CO2 28 04/14/2014 1458   BUN 18 04/14/2014 1458   CREATININE 0.67 04/14/2014 1458   CREATININE 0.73 04/06/2010 2016      Component Value Date/Time   CALCIUM 10.3 04/14/2014 1458   ALKPHOS 90 10/29/2012 1146   AST 29 10/29/2012 1146   ALT 18 10/29/2012 1146   BILITOT 0.4  10/29/2012 1146       PAST MEDICAL HISTORY: Past Medical History  Diagnosis Date  . Seizure   . Depression   . Hypertension     PAST SURGICAL HISTORY: Past Surgical History  Procedure Laterality Date  . No past surgeries      MEDICATIONS: Current Outpatient Prescriptions on File Prior to Visit  Medication Sig Dispense Refill  . acetaminophen (TYLENOL) 325 MG tablet Take 2 tablets (650 mg total) by mouth every 6 (six) hours as needed for pain (for headache). 30 tablet 3  . amitriptyline (ELAVIL) 25 MG tablet TAKE ONE TABLET BY MOUTH AT BEDTIME 30 tablet 2  . diclofenac (FLECTOR) 1.3 % PTCH PLACE 1 PATCH ONTO THE SKIN TWICE DAILY 60 patch 3  . hydrochlorothiazide (HYDRODIURIL) 25 MG tablet TAKE 1 TABLET BY MOUTH EVERY DAY 90 tablet 4  . levETIRAcetam (KEPPRA) 1000 MG tablet TAKE ONE AND ONE HALF TABLET BY MOUTH EVERY TWELVE HOURS 90 tablet 6  . VOLTAREN 1 % GEL APPLY 2 GRAMS TOPICALLY FOUR TIMES DAILY 100 g 5   No current facility-administered medications on file prior to visit.    ALLERGIES: No Known Allergies  FAMILY HISTORY: No family history on file.  SOCIAL HISTORY: History   Social History  . Marital Status: Single    Spouse Name: N/A    Number of Children: N/A  . Years of Education: N/A   Occupational History  . Not on file.   Social History Main Topics  . Smoking status: Current Every Day Smoker -- 0.50 packs/day for 15 years    Types: Cigarettes  . Smokeless tobacco: Not on file     Comment: trying to cut back a little each day  . Alcohol Use: 12.0 oz/week    20 Cans of beer per week     Comment: 2 beers a night  . Drug Use: No  . Sexual Activity: Not on file   Other Topics Concern  . Not on file   Social History Narrative   Single, does not regularly exercise, and completed the 7th grade.      Mother- dec.- died during childbirth, birth trauma   Brother- epilepsy- dec- 41's   Father- unknown   No known history of cancer in 1st degree  family members    REVIEW OF SYSTEMS: Constitutional: No fevers, chills, or sweats, no generalized fatigue, change in appetite Eyes: No visual changes, double vision, eye pain Ear, nose and throat: No hearing loss, ear pain, nasal congestion, sore throat Cardiovascular: No chest pain, palpitations Respiratory:  No shortness of breath at rest or with exertion, wheezes GastrointestinaI: No nausea, vomiting, diarrhea, abdominal pain, fecal incontinence Genitourinary:  No dysuria, urinary retention or frequency Musculoskeletal:  No neck pain,+ back pain Integumentary: No rash, pruritus, skin lesions Neurological: as above Psychiatric: No depression, insomnia, anxiety Endocrine: No palpitations, fatigue, diaphoresis, mood swings, change in appetite, change in weight, increased thirst Hematologic/Lymphatic:  No anemia, purpura, petechiae. Allergic/Immunologic: no itchy/runny eyes, nasal congestion, recent allergic reactions, rashes  PHYSICAL EXAM: Filed Vitals:   07/30/14  1246  BP: 122/80  Pulse: 80   General: No acute distress, irritable Head:  Normocephalic/atraumatic Eyes: Fundoscopic exam shows bilateral sharp discs, no vessel changes, exudates, or hemorrhages Neck: supple, no paraspinal tenderness, full range of motion Back: No paraspinal tenderness Heart: regular rate and rhythm Lungs: Clear to auscultation bilaterally. Vascular: No carotid bruits. Skin/Extremities: No rash, no edema, scars on right arm (due to falls from seizure per patient) Neurological Exam: Mental status: alert and oriented to person, place, and time, no dysarthria or aphasia, Fund of knowledge is appropriate.  Recent and remote memory are intact. 3/3 delayed recall. Attention and concentration are normal.    Able to name objects and repeat phrases. Cranial nerves: CN I: not tested CN II: pupils equal, round and reactive to light, visual fields intact, fundi unremarkable. CN III, IV, VI:  full range of  motion, no nystagmus, no ptosis CN V: facial sensation intact CN VII: upper and lower face symmetric CN VIII: hearing intact to finger rub CN IX, X: gag intact, uvula midline CN XI: sternocleidomastoid and trapezius muscles intact CN XII: tongue midline Bulk & Tone: normal, no fasciculations. Motor: 5/5 throughout with no pronator drift. Sensation: intact to light touch, cold, pin, vibration and joint position sense.  No extinction to double simultaneous stimulation.  Romberg test negative Deep Tendon Reflexes: +2 throughout except for absent ankle jerks bilaterally, no ankle clonus Plantar responses: downgoing bilaterally Cerebellar: no incoordination on finger to nose, heel to shin. No dysdiadochokinesia Gait: slow and cautious due to back and leg pain, no ataxia Tremor: none  IMPRESSION: This is a 48 year old right-handed woman with a history of  hypertension, back pain, bilateral leg pain, and seizures since childhood. 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 However, she also reports a warning of something coming on and mouth twisting to the left, suggestive of localization-related epilepsy. Prior EEGs reported as normal. She is on a broad spectrum AED Keppra at this time, however continues to have seizures on higher dose. I discussed the option of either adding on a second AED such as Zonisamide to help with both seizure and headache prophylaxis, however she became very adamant about not wanting to be on 2 AEDs. I discussed switching to Zonisamide with plan to taper off Keppra to be on monotherapy, however she is very hesitant and focuses on the dosage of the medication ("1500mg  twice a day is high"). I explained to her that these are therapeutic doses and how dosage numbers for different medications are not additive, however she has some difficulty comprehending  this and would like to think about it first. We agreed to keep a seizure diary for the next 2-3 months until she is almost done with her Keppra prescription, then follow-up and if still having seizures would recommend another AED.  driving laws were discussed with the patient, and she knows to stop driving after a seizure, until 6 months seizure-free. She will follow-up in 3 months.  Thank you for allowing me to participate in the care of this patient. Please do not hesitate to call for any questions or concerns.   Ellouise Newer, M.D.  CC: Dr. Raelene Bott

## 2014-07-31 ENCOUNTER — Encounter: Payer: Self-pay | Admitting: Neurology

## 2014-09-05 ENCOUNTER — Other Ambulatory Visit: Payer: Self-pay | Admitting: *Deleted

## 2014-09-05 MED ORDER — AMITRIPTYLINE HCL 25 MG PO TABS: ORAL_TABLET | ORAL | Status: DC

## 2014-09-26 DIAGNOSIS — C170 Malignant neoplasm of duodenum: Secondary | ICD-10-CM

## 2014-09-26 HISTORY — DX: Malignant neoplasm of duodenum: C17.0

## 2014-10-21 ENCOUNTER — Other Ambulatory Visit: Payer: Self-pay | Admitting: *Deleted

## 2014-10-22 MED ORDER — DICLOFENAC SODIUM 1 % TD GEL: 2.0000 g | Freq: Four times a day (QID) | TRANSDERMAL | Status: DC

## 2014-10-29 ENCOUNTER — Telehealth: Payer: Self-pay | Admitting: Neurology

## 2014-10-29 ENCOUNTER — Telehealth: Payer: Self-pay | Admitting: *Deleted

## 2014-10-29 NOTE — Telephone Encounter (Signed)
Forwarded to PCP Dr Raelene Bott

## 2014-10-29 NOTE — Telephone Encounter (Signed)
Call from patient said that she was not going to her Neurology appointment and was going to cancel.  Patient stated she was going to stay on her present medication and was cancelling her appointment.  Call to [facility] Neurology appointment was cancelled for tomorrow 10/30/2014.  Sander Nephew, RN 10/29/2014 10:14 AM

## 2014-10-29 NOTE — Telephone Encounter (Signed)
Pt canceled her f/u for tomorrow 10/30/14. Pt did not want to be seen.

## 2014-10-30 ENCOUNTER — Ambulatory Visit: Payer: Medicaid Other | Admitting: Neurology

## 2014-11-27 ENCOUNTER — Other Ambulatory Visit: Payer: Self-pay | Admitting: *Deleted

## 2014-11-28 MED ORDER — DICLOFENAC EPOLAMINE 1.3 % TD PTCH: MEDICATED_PATCH | TRANSDERMAL | Status: DC

## 2014-11-28 MED ORDER — AMITRIPTYLINE HCL 25 MG PO TABS: ORAL_TABLET | ORAL | Status: AC

## 2015-02-03 ENCOUNTER — Encounter: Payer: Self-pay | Admitting: *Deleted

## 2015-02-07 ENCOUNTER — Other Ambulatory Visit: Payer: Self-pay | Admitting: Internal Medicine

## 2015-04-06 ENCOUNTER — Other Ambulatory Visit: Payer: Self-pay | Admitting: Internal Medicine

## 2015-05-14 ENCOUNTER — Other Ambulatory Visit: Payer: Self-pay

## 2015-05-14 DIAGNOSIS — Z1231 Encounter for screening mammogram for malignant neoplasm of breast: Secondary | ICD-10-CM

## 2015-05-15 ENCOUNTER — Ambulatory Visit (INDEPENDENT_AMBULATORY_CARE_PROVIDER_SITE_OTHER): Payer: Medicaid Other | Admitting: Internal Medicine

## 2015-05-15 ENCOUNTER — Inpatient Hospital Stay (HOSPITAL_COMMUNITY)
Admission: AD | Admit: 2015-05-15 | Discharge: 2015-05-22 | DRG: 436 | Disposition: A | Discharge status: Home / Self Care | Payer: Medicaid Other | Source: Ambulatory Visit | Attending: Internal Medicine | Admitting: Internal Medicine

## 2015-05-15 ENCOUNTER — Encounter: Payer: Self-pay | Admitting: Licensed Clinical Social Worker

## 2015-05-15 ENCOUNTER — Encounter: Payer: Self-pay | Admitting: Internal Medicine

## 2015-05-15 ENCOUNTER — Encounter (HOSPITAL_COMMUNITY): Payer: Self-pay | Admitting: General Practice

## 2015-05-15 ENCOUNTER — Inpatient Hospital Stay (HOSPITAL_COMMUNITY): Payer: Medicaid Other

## 2015-05-15 VITALS — BP 112/73 | HR 96 | Temp 98.6°F | Ht 60.0 in | Wt 106.2 lb

## 2015-05-15 DIAGNOSIS — E873 Alkalosis: Secondary | ICD-10-CM | POA: Diagnosis present

## 2015-05-15 DIAGNOSIS — D473 Essential (hemorrhagic) thrombocythemia: Secondary | ICD-10-CM | POA: Diagnosis present

## 2015-05-15 DIAGNOSIS — F1010 Alcohol abuse, uncomplicated: Secondary | ICD-10-CM | POA: Diagnosis present

## 2015-05-15 DIAGNOSIS — R17 Unspecified jaundice: Secondary | ICD-10-CM | POA: Diagnosis not present

## 2015-05-15 DIAGNOSIS — K8689 Other specified diseases of pancreas: Secondary | ICD-10-CM

## 2015-05-15 DIAGNOSIS — E878 Other disorders of electrolyte and fluid balance, not elsewhere classified: Secondary | ICD-10-CM

## 2015-05-15 DIAGNOSIS — G40909 Epilepsy, unspecified, not intractable, without status epilepticus: Secondary | ICD-10-CM | POA: Diagnosis present

## 2015-05-15 DIAGNOSIS — K8021 Calculus of gallbladder without cholecystitis with obstruction: Secondary | ICD-10-CM | POA: Diagnosis present

## 2015-05-15 DIAGNOSIS — E871 Hypo-osmolality and hyponatremia: Secondary | ICD-10-CM | POA: Diagnosis present

## 2015-05-15 DIAGNOSIS — F32A Depression, unspecified: Secondary | ICD-10-CM

## 2015-05-15 DIAGNOSIS — K5900 Constipation, unspecified: Secondary | ICD-10-CM | POA: Diagnosis not present

## 2015-05-15 DIAGNOSIS — Z0000 Encounter for general adult medical examination without abnormal findings: Secondary | ICD-10-CM

## 2015-05-15 DIAGNOSIS — D72829 Elevated white blood cell count, unspecified: Secondary | ICD-10-CM | POA: Diagnosis present

## 2015-05-15 DIAGNOSIS — D630 Anemia in neoplastic disease: Secondary | ICD-10-CM | POA: Diagnosis present

## 2015-05-15 DIAGNOSIS — L299 Pruritus, unspecified: Secondary | ICD-10-CM | POA: Diagnosis present

## 2015-05-15 DIAGNOSIS — Z6820 Body mass index (BMI) 20.0-20.9, adult: Secondary | ICD-10-CM

## 2015-05-15 DIAGNOSIS — M545 Low back pain: Secondary | ICD-10-CM | POA: Diagnosis not present

## 2015-05-15 DIAGNOSIS — I10 Essential (primary) hypertension: Secondary | ICD-10-CM

## 2015-05-15 DIAGNOSIS — K831 Obstruction of bile duct: Secondary | ICD-10-CM

## 2015-05-15 DIAGNOSIS — R454 Irritability and anger: Secondary | ICD-10-CM | POA: Diagnosis not present

## 2015-05-15 DIAGNOSIS — F1020 Alcohol dependence, uncomplicated: Secondary | ICD-10-CM | POA: Diagnosis not present

## 2015-05-15 DIAGNOSIS — C259 Malignant neoplasm of pancreas, unspecified: Principal | ICD-10-CM | POA: Diagnosis present

## 2015-05-15 DIAGNOSIS — F329 Major depressive disorder, single episode, unspecified: Secondary | ICD-10-CM | POA: Diagnosis present

## 2015-05-15 DIAGNOSIS — K868 Other specified diseases of pancreas: Secondary | ICD-10-CM | POA: Diagnosis not present

## 2015-05-15 DIAGNOSIS — C250 Malignant neoplasm of head of pancreas: Secondary | ICD-10-CM | POA: Diagnosis not present

## 2015-05-15 DIAGNOSIS — F17210 Nicotine dependence, cigarettes, uncomplicated: Secondary | ICD-10-CM | POA: Diagnosis present

## 2015-05-15 DIAGNOSIS — E780 Pure hypercholesterolemia: Secondary | ICD-10-CM | POA: Diagnosis present

## 2015-05-15 DIAGNOSIS — F17200 Nicotine dependence, unspecified, uncomplicated: Secondary | ICD-10-CM | POA: Diagnosis present

## 2015-05-15 DIAGNOSIS — F1210 Cannabis abuse, uncomplicated: Secondary | ICD-10-CM | POA: Diagnosis not present

## 2015-05-15 DIAGNOSIS — R109 Unspecified abdominal pain: Secondary | ICD-10-CM

## 2015-05-15 DIAGNOSIS — R1031 Right lower quadrant pain: Secondary | ICD-10-CM | POA: Diagnosis not present

## 2015-05-15 DIAGNOSIS — E46 Unspecified protein-calorie malnutrition: Secondary | ICD-10-CM | POA: Diagnosis present

## 2015-05-15 DIAGNOSIS — E876 Hypokalemia: Secondary | ICD-10-CM | POA: Diagnosis present

## 2015-05-15 DIAGNOSIS — Z9889 Other specified postprocedural states: Secondary | ICD-10-CM | POA: Diagnosis not present

## 2015-05-15 HISTORY — DX: Alcohol abuse, uncomplicated: F10.10

## 2015-05-15 LAB — PREGNANCY, URINE: PREG TEST UR: NEGATIVE

## 2015-05-15 LAB — CBC WITH DIFFERENTIAL/PLATELET
BASOS PCT: 0 % (ref 0–1)
Basophils Absolute: 0 10*3/uL (ref 0.0–0.1)
EOS PCT: 0 % (ref 0–5)
Eosinophils Absolute: 0 10*3/uL (ref 0.0–0.7)
HCT: 26.5 % — ABNORMAL LOW (ref 36.0–46.0)
Hemoglobin: 9.2 g/dL — ABNORMAL LOW (ref 12.0–15.0)
LYMPHS PCT: 16 % (ref 12–46)
Lymphs Abs: 2 10*3/uL (ref 0.7–4.0)
MCH: 34.1 pg — ABNORMAL HIGH (ref 26.0–34.0)
MCHC: 34.7 g/dL (ref 30.0–36.0)
MCV: 98.1 fL (ref 78.0–100.0)
MONO ABS: 0.4 10*3/uL (ref 0.1–1.0)
Monocytes Relative: 3 % (ref 3–12)
NEUTROS ABS: 10.3 10*3/uL — AB (ref 1.7–7.7)
Neutrophils Relative %: 81 % — ABNORMAL HIGH (ref 43–77)
PLATELETS: 687 10*3/uL — AB (ref 150–400)
RBC: 2.7 MIL/uL — AB (ref 3.87–5.11)
RDW: 16.4 % — AB (ref 11.5–15.5)
WBC: 12.7 10*3/uL — ABNORMAL HIGH (ref 4.0–10.5)

## 2015-05-15 LAB — RAPID URINE DRUG SCREEN, HOSP PERFORMED
Amphetamines: NOT DETECTED
Barbiturates: NOT DETECTED
Benzodiazepines: NOT DETECTED
Cocaine: NOT DETECTED
Opiates: NOT DETECTED
TETRAHYDROCANNABINOL: POSITIVE — AB

## 2015-05-15 LAB — COMPREHENSIVE METABOLIC PANEL
ALBUMIN: 2.3 g/dL — AB (ref 3.5–5.0)
ALT: 169 U/L — ABNORMAL HIGH (ref 14–54)
AST: 222 U/L — ABNORMAL HIGH (ref 15–41)
Alkaline Phosphatase: 575 U/L — ABNORMAL HIGH (ref 38–126)
Anion gap: 16 — ABNORMAL HIGH (ref 5–15)
BUN: <5 mg/dL — ABNORMAL LOW (ref 6–20)
CO2: 33 mmol/L — ABNORMAL HIGH (ref 22–32)
Calcium: 9.5 mg/dL (ref 8.9–10.3)
Chloride: 76 mmol/L — ABNORMAL LOW (ref 101–111)
Creatinine, Ser: 0.71 mg/dL (ref 0.44–1.00)
GFR calc Af Amer: >60 mL/min (ref >60)
GFR calc non Af Amer: >60 mL/min (ref >60)
GLUCOSE: 126 mg/dL — AB (ref 65–99)
Potassium: 2.5 mmol/L — CL (ref 3.5–5.1)
Sodium: 125 mmol/L — ABNORMAL LOW (ref 135–145)
Total Bilirubin: 27.6 mg/dL (ref 0.3–1.2)
Total Protein: 6.7 g/dL (ref 6.5–8.1)

## 2015-05-15 LAB — BASIC METABOLIC PANEL
Anion gap: 18 — ABNORMAL HIGH (ref 5–15)
Anion gap: 14 (ref 5–15)
BUN: 10 mg/dL (ref 6–20)
BUN: 8 mg/dL (ref 6–20)
CALCIUM: 9.5 mg/dL (ref 8.9–10.3)
CHLORIDE: 78 mmol/L — AB (ref 101–111)
CO2: 28 mmol/L (ref 22–32)
CO2: 29 mmol/L (ref 22–32)
CREATININE: 0.6 mg/dL (ref 0.44–1.00)
CREATININE: 0.72 mg/dL (ref 0.44–1.00)
Calcium: 8.8 mg/dL — ABNORMAL LOW (ref 8.9–10.3)
Chloride: 79 mmol/L — ABNORMAL LOW (ref 101–111)
GFR calc Af Amer: >60 mL/min (ref >60)
GFR calc Af Amer: >60 mL/min (ref >60)
GFR calc non Af Amer: >60 mL/min (ref >60)
GFR calc non Af Amer: >60 mL/min (ref >60)
Glucose, Bld: 90 mg/dL (ref 65–99)
Glucose, Bld: 187 mg/dL — ABNORMAL HIGH (ref 65–99)
Potassium: 2.1 mmol/L — CL (ref 3.5–5.1)
Potassium: 2.1 mmol/L — CL (ref 3.5–5.1)
SODIUM: 124 mmol/L — AB (ref 135–145)
SODIUM: 122 mmol/L — AB (ref 135–145)

## 2015-05-15 LAB — APTT: APTT: 29 s (ref 24–37)

## 2015-05-15 LAB — MAGNESIUM: MAGNESIUM: 2.9 mg/dL — AB (ref 1.7–2.4)

## 2015-05-15 LAB — PROTIME-INR
INR: 1.05 (ref 0.00–1.49)
PROTHROMBIN TIME: 13.9 s (ref 11.6–15.2)

## 2015-05-15 LAB — LIPASE, BLOOD: LIPASE: 57 U/L — AB (ref 22–51)

## 2015-05-15 MED ORDER — LORAZEPAM 0.5 MG PO TABS: 1.0000 mg | ORAL_TABLET | Freq: Four times a day (QID) | ORAL | Status: DC | PRN

## 2015-05-15 MED ORDER — AMITRIPTYLINE HCL 50 MG PO TABS
25.0000 mg | ORAL_TABLET | Freq: Every day | ORAL | Status: DC
  Administered 2015-05-15 – 2015-05-21 (×7): 25 mg via ORAL
  Filled 2015-05-15 (×8): qty 1

## 2015-05-15 MED ORDER — LORAZEPAM 2 MG/ML IJ SOLN: 0.5000 mg | Freq: Four times a day (QID) | INTRAMUSCULAR | Status: AC | PRN

## 2015-05-15 MED ORDER — VITAMIN B-1 50 MG PO TABS
100.0000 mg | ORAL_TABLET | Freq: Every day | ORAL | Status: DC
  Administered 2015-05-16 – 2015-05-17 (×2): 100 mg via ORAL
  Filled 2015-05-15 (×5): qty 2

## 2015-05-15 MED ORDER — SODIUM CHLORIDE 0.9 % IJ SOLN
3.0000 mL | Freq: Two times a day (BID) | INTRAMUSCULAR | Status: DC
  Administered 2015-05-16 – 2015-05-19 (×5): 3 mL via INTRAVENOUS
  Administered 2015-05-19: 10 mL via INTRAVENOUS
  Administered 2015-05-20 – 2015-05-22 (×4): 3 mL via INTRAVENOUS

## 2015-05-15 MED ORDER — LORAZEPAM 2 MG/ML IJ SOLN: 1.0000 mg | Freq: Four times a day (QID) | INTRAMUSCULAR | Status: DC | PRN

## 2015-05-15 MED ORDER — LORAZEPAM 0.5 MG PO TABS: 0.5000 mg | ORAL_TABLET | Freq: Four times a day (QID) | ORAL | Status: AC | PRN

## 2015-05-15 MED ORDER — ADULT MULTIVITAMIN W/MINERALS CH
1.0000 | ORAL_TABLET | Freq: Every day | ORAL | Status: DC
  Administered 2015-05-16 – 2015-05-22 (×7): 1 via ORAL
  Filled 2015-05-15 (×7): qty 1

## 2015-05-15 MED ORDER — THIAMINE HCL 100 MG/ML IJ SOLN
100.0000 mg | Freq: Every day | INTRAMUSCULAR | Status: DC
  Filled 2015-05-15: qty 2

## 2015-05-15 MED ORDER — HYDROCHLOROTHIAZIDE 25 MG PO TABS: 12.5000 mg | ORAL_TABLET | Freq: Every day | ORAL | Status: AC

## 2015-05-15 MED ORDER — POTASSIUM CHLORIDE 10 MEQ/100ML IV SOLN
10.0000 meq | INTRAVENOUS | Status: AC
  Administered 2015-05-15 (×2): 10 meq via INTRAVENOUS
  Filled 2015-05-15 (×2): qty 100

## 2015-05-15 MED ORDER — SODIUM CHLORIDE 0.9 % IV SOLN: INTRAVENOUS | Status: DC

## 2015-05-15 MED ORDER — SODIUM CHLORIDE 0.9 % IJ SOLN
3.0000 mL | Freq: Two times a day (BID) | INTRAMUSCULAR | Status: DC
  Administered 2015-05-16 – 2015-05-19 (×3): 3 mL via INTRAVENOUS
  Administered 2015-05-19: 10 mL via INTRAVENOUS
  Administered 2015-05-20 – 2015-05-21 (×2): 3 mL via INTRAVENOUS

## 2015-05-15 MED ORDER — FOLIC ACID 1 MG PO TABS
1.0000 mg | ORAL_TABLET | Freq: Every day | ORAL | Status: DC
Start: 2015-05-15 — End: 2015-05-22
  Administered 2015-05-16 – 2015-05-22 (×7): 1 mg via ORAL
  Filled 2015-05-15 (×7): qty 1

## 2015-05-15 MED ORDER — SODIUM CHLORIDE 0.9 % IV SOLN
INTRAVENOUS | Status: DC
  Administered 2015-05-15: 18:00:00 via INTRAVENOUS

## 2015-05-15 MED ORDER — POTASSIUM CHLORIDE 10 MEQ/100ML IV SOLN
10.0000 meq | INTRAVENOUS | Status: AC
Start: 2015-05-15 — End: 2015-05-15
  Administered 2015-05-15: 10 meq via INTRAVENOUS
  Filled 2015-05-15: qty 100

## 2015-05-15 MED ORDER — LEVETIRACETAM 500 MG PO TABS
1500.0000 mg | ORAL_TABLET | Freq: Two times a day (BID) | ORAL | Status: DC
Start: 2015-05-15 — End: 2015-05-22
  Administered 2015-05-15 – 2015-05-22 (×14): 1500 mg via ORAL
  Filled 2015-05-15: qty 3
  Filled 2015-05-15: qty 6
  Filled 2015-05-15: qty 3
  Filled 2015-05-15: qty 6
  Filled 2015-05-15 (×3): qty 3
  Filled 2015-05-15: qty 6
  Filled 2015-05-15 (×8): qty 3

## 2015-05-15 MED ORDER — POTASSIUM CHLORIDE CRYS ER 20 MEQ PO TBCR
40.0000 meq | EXTENDED_RELEASE_TABLET | Freq: Once | ORAL | Status: AC
  Administered 2015-05-15: 40 meq via ORAL
  Filled 2015-05-15: qty 2

## 2015-05-15 MED ORDER — POTASSIUM CHLORIDE 10 MEQ/100ML IV SOLN
10.0000 meq | INTRAVENOUS | Status: AC
  Administered 2015-05-15 – 2015-05-16 (×4): 10 meq via INTRAVENOUS
  Filled 2015-05-15 (×2): qty 100

## 2015-05-15 MED ORDER — HYDROCHLOROTHIAZIDE 25 MG PO TABS: 12.5000 mg | ORAL_TABLET | Freq: Every day | ORAL | Status: DC

## 2015-05-15 MED ORDER — DIPHENHYDRAMINE HCL 25 MG PO CAPS: 25.0000 mg | ORAL_CAPSULE | Freq: Four times a day (QID) | ORAL | Status: DC | PRN

## 2015-05-15 NOTE — Assessment & Plan Note (Addendum)
Assessment: Pt with more than 1 month duration of jaundice and RUQ pain with last US abdomen on 04/11/07 with cholelithiasis and dilated common bile duct of 6.6 mm in diameter who presents with laboratory findings consistent with cholestasis possibly due to choledocholithiasis.   Plan:  -Obtain stat CMP ---> bilirubin 27.6, AST 222, ALT 169, Alk phos 575, Alb 2.3, K 2.5, AG 16 -Obtain stat PT and PTT ----> normal  -Obtain CBC with diff, acute hepatitis panel, HIV Ab, and lipase   -Obtain stat US abdomen  -Pt admitted to Internal Medicine Teaching service for further work-up of hyperbilirubinemia

## 2015-05-15 NOTE — Progress Notes (Signed)
Pt taken to 5W 19 via w/c and belongings while Dr Naaman Plummer gave report to nurse. Hilda Blades Kirin Pastorino RN 05/15/15 3PM

## 2015-05-15 NOTE — Progress Notes (Addendum)
Patient seen at bedside on account of decreased sodium to 122 from 125. Has no significant complaints aside from too many people bugging her overnight. Has only received ~200cc NS since admission as patient only has one IV access site and has been receiving potassium via this IV. Etiology of hyponatremia unclear, however, I do suspect this is more 2/2 thiazide/poor po intake rather than SIADH. Nurse states she has had little urine output which appears concentrated. No clear medications on med list that would cause SIADH, paraneoplasm extremely rare, also more associated w/ SCLC rather than a possible pancreatic CA.  -New IV access established. Will continue NS @ 150 cc/hr -Repeat BMP @ 1 AM; if Na still decreasing, will stop IVF, fluid restriction. -Obtain urine sodium, urine osm, serum osm, AM cortisol -Still quite hypokalemic, K 2.1. Had only received 2 runs of 10 mEq IV prior to this lab (still receiving 2 x10 more). Will give K-dur 40 mEq po + 4 more runs of 10 (for total potassium administration of 120 mEq). Mag normal.   Natasha Bence, MD PGY-3, Internal Medicine Pager: [phone]   Addendum: Patient w/ bilirubin of 27.6, most likely 2/2 an obstructive process. Most likely etiology of low sodium, therefore suspect pseudohyponatremia in the setting of elevated lipids. Would suspect patient has normal serum osm and elevated total cholesterol.  -Serum osm pending -Lipid panel pending -STOP IVF -Repeat BMP pending  Given SEVERE elevation of bilirubin, GI consult in AM imperative for possible percutaneous drainage.  Natasha Bence, MD PGY-3, Internal Medicine Pager: [phone]

## 2015-05-15 NOTE — H&P (Signed)
Date: 05/15/2015               Patient Name:  Shelia Arnold MRN: [id number]  DOB: [birth year] Age / Sex: 49 y.o., female   PCP: [name], MD         Medical Service: Internal Medicine Teaching Service         Attending Physician: Dr. Bartholomew Crews, MD    First Contact: Dr. Loleta Chance Pager: [phone]  Second Contact: Dr. Duwaine Maxin Pager: [phone]       After Hours (After 5p/  First Contact Pager: [phone]  weekends / holidays): Second Contact Pager: [phone]   Chief Complaint: "My eyes have turned yellow and I've been itching"  History of Present Illness: Shelia Arnold is a 49 year-old African-American lady with history of seizure disorder since childhood, hypertension, depression, alcohol abuse, and tobacco use who presented with a 2 month history of progressively yellowing eyes, bright orange urine, itching that is worse during the heat of the day, 10 pound weight loss, and mild right upper quadrant pain that she attributes to her bra-strap. She said this pain has been present for years and is unchanged over the past two months. She drinks about 4 beers a day and has never had withdrawal symptoms. Notably, her last drink was yesterday afternoon. She also smokes marijuana but has never used IV drugs. Her sister was an alcoholic who had cirrhosis but nobody else in the family has had liver problems like this. She says she has not been taking any tylenol nor has she taken any new medications recently. She denies any diarrhea or problems with her bowel movements.   Meds: Current Facility-Administered Medications  Medication Dose Route Frequency Provider Last Rate Last Dose  . folic acid (FOLVITE) tablet 1 mg  1 mg Oral Daily Francesca Oman, DO      . LORazepam (ATIVAN) tablet 0.5 mg  0.5 mg Oral Q6H PRN Francesca Oman, DO       Or  . LORazepam (ATIVAN) injection 0.5 mg  0.5 mg Intravenous Q6H PRN Francesca Oman, DO      . multivitamin with minerals tablet 1 tablet  1  tablet Oral Daily Francesca Oman, DO      . potassium chloride 10 mEq in 100 mL IVPB  10 mEq Intravenous Q1 Hr x 4 Loleta Chance, MD      . thiamine (VITAMIN B-1) tablet 100 mg  100 mg Oral Daily Francesca Oman, DO       Or  . thiamine (B-1) injection 100 mg  100 mg Intravenous Daily Francesca Oman, DO       Current Outpatient Prescriptions  Medication Sig Dispense Refill  . amitriptyline (ELAVIL) 25 MG tablet TAKE ONE TABLET BY MOUTH AT BEDTIME 90 tablet 3  . diclofenac (FLECTOR) 1.3 % PTCH PLACE 1 PATCH ONTO THE SKIN TWICE DAILY 180 patch 3  . diclofenac sodium (VOLTAREN) 1 % GEL Apply 2 g topically 4 (four) times daily. 100 g 5  . hydrochlorothiazide (HYDRODIURIL) 25 MG tablet Take 0.5 tablets (12.5 mg total) by mouth daily. 90 tablet 3  . levETIRAcetam (KEPPRA) 1000 MG tablet TAKE 1& 1/2 TABLETS BY MOUTH EVERY 12 HOURS 90 tablet 1    Allergies: Allergies as of 05/15/2015  . (No Known Allergies)   Past Medical History  Diagnosis Date  . Seizure   . Depression   . Hypertension    Past Surgical History  Procedure  Laterality Date  . No past surgeries     No family history on file. Social History   Social History  . Marital Status: Single    Spouse Name: N/A  . Number of Children: N/A  . Years of Education: N/A   Occupational History  . Not on file.   Social History Main Topics  . Smoking status: Current Every Day Smoker -- 0.50 packs/day for 15 years    Types: Cigarettes  . Smokeless tobacco: Not on file     Comment: trying to cut back a little each day  . Alcohol Use: 12.0 oz/week    20 Cans of beer per week     Comment: 2 beers a night  . Drug Use: No  . Sexual Activity: Not on file   Other Topics Concern  . Not on file   Social History Narrative   Single, does not regularly exercise, and completed the 7th grade.      Mother- dec.- died during childbirth, birth trauma   Brother- epilepsy- dec- 79's   Father- unknown   No known history of cancer in 1st degree  family members   Review of Systems  Constitutional: Positive for weight loss and malaise/fatigue. Negative for fever, chills and diaphoresis.  Eyes: Negative for blurred vision.  Respiratory: Negative for cough.   Cardiovascular: Negative for chest pain.  Gastrointestinal: Positive for abdominal pain. Negative for heartburn, nausea, vomiting, diarrhea, constipation, blood in stool and melena.  Genitourinary:       Red urine  Musculoskeletal: Positive for back pain.  Skin:       Diffuse yellowing of skin, eyes, and fingernails. No other skin lesions.  Neurological: Positive for seizures. Negative for dizziness and headaches.  Psychiatric/Behavioral: Positive for substance abuse.    Physical Exam: Last menstrual period 12/26/2014. General: Sitting in a chair with a depressed affect HEENT: Scleral icterus present. No lymphadenopathy. No oral lesions. Cardiac: RRR, no rubs, murmurs or gallops Pulm: clear to auscultation bilaterally, moving normal volumes of air Abd: Non-tender to palpation. Non-distended; no ascites. Liver margin palpable below the costal margin. Murphy's sign negative. No splenomegaly. Ext: Diffuse jaundice. No peripheral edema.  Neuro: Cranial nerves II-XII grossly intact  Lab results: Basic Metabolic Panel:  Recent Labs  05/15/15 1135  NA 125*  K 2.5*  CL 76*  CO2 33*  GLUCOSE 126*  BUN <5*  CREATININE 0.71  CALCIUM 9.5   Liver Function Tests:  Recent Labs  05/15/15 1135  AST 222*  ALT 169*  ALKPHOS 575*  BILITOT 27.6*  PROT 6.7  ALBUMIN 2.3*   Coagulation:  Recent Labs  05/15/15 1208  LABPROT 13.9  INR 1.05   Urine Drug Screen: Drugs of Abuse     Component Value Date/Time   LABOPIA NONE DETECTED 02/22/2010 0810   COCAINSCRNUR NONE DETECTED 02/22/2010 0810   LABBENZ NONE DETECTED 02/22/2010 0810   AMPHETMU NONE DETECTED 02/22/2010 0810   THCU POSITIVE* 02/22/2010 0810   LABBARB  02/22/2010 0810    NONE DETECTED        DRUG  SCREEN FOR MEDICAL PURPOSES ONLY.  IF CONFIRMATION IS NEEDED FOR ANY PURPOSE, NOTIFY LAB WITHIN 5 DAYS.        LOWEST DETECTABLE LIMITS FOR URINE DRUG SCREEN Drug Class       Cutoff (ng/mL) Amphetamine      1000 Barbiturate      200 Benzodiazepine   626 Tricyclics       948 Opiates  300 Cocaine          300 THC              50     Assessment and Plan: Shelia Arnold is a 49 year-old African-American lady presenting with a two month history of painless jaundice, an enlarged liver with negative Murphy's sign, found to have a cholestatic liver enzyme pattern. Given her young age, being a female, with cholestatic liver enzymes, and no pain, primary biliary cirrhosis is at the top of my differential. Intrahepatic cholestasis of pregnancy would be very unusual but should also be considered given her last menstrual period was in April. We will order a urine pregnancy test. Primary sclerosing cholangitis is also possible but she denies symptoms concerning for ulcerating colitis. I think choledocholithiasis is less likely because her pain is so mild and Murphy's sign is negative. However a right upper quadrant ultrasound in 2008 showed dilation of the common bile duct. Pancreatic cancer and hepatocellular carcinoma are also possible; the former is supported by her unintentional weight loss, smoking history, and elevated glucose; the latter of which is associated with primary biliary cirrhosis and is supported by enlarged liver on exam. Hepatitis is less likely given her mildly elevated AST/ALT but alcoholic hepatitis should also be on the differential given her history.  Painless jaundice: Per above, the differential includes primary biliary cirrhosis, primary sclerosis cholangitis, intrahepatic cholestasis of pregnancy, choledocholithiasis, pancreatic cancer, hepatocellular carcinoma, and alcoholic hepatitis. We will obtain a right upper quadrant ultrasound to evaluate for choledocholithiasis and  a urine pregnancy test first. If this is negative, we will obtain an abdominal CT to evaluate for pancreatic masses and anti-mitochondrial antibody to evaluate for primary biliary cirrhosis. Her main complaint is itching, clearly due to hyperbilirubinemia. We will give her benadryl to help her itching for now. If this doesn't help her, we may consider ursodeoxycholic acid; if she is recalcitrant, we can consider rifaximin or naltrexone. -Right upper quadrant ultrasound to evaluate for choledocholethiasis -Urine pregnancy test -Pending the ultrasound and UPT, we will obtain abdominal CT to evaluate the pancreas -We will consider anti-mitochondrial antibody if the above imaging is negative -Diphenhydramine 25mg  q5hr PRN for pruritis  Hypokalemia, hyponatremia, hypochloremic metabolic : Her potassium was 2.5 in the clinic and she has hypochloremic metabolic alkalosis. She is on a thiazide at home which may explain her electrolyte abnormalities. She denies diarrhea which would be more likely to explain these labs. Her hyponatremia may be secondary to SIADH from malignancy. We will slowly correct her hyponatremia with normal saline. -71meq IV potassium given -Will start normal saline at 100cc/hr to correct hyponatremia at a rate of no more than 22mmol/24hr -CMP tomorrow  Alcohol abuse: She drinks 4 beers a day; her last beer was yesterday. She also has a seizure disorder but has never had withdrawal symptoms. We will watch her with CIWA scoring. -CIWA scoring  Seizure history: Last seizure was a tonic clonic seizure in June. She is currently on leviteracetam. This makes her more susceptible to alcohol withdrawal seizures. -Will obtain leviterecetam level  Tobacco abuse: She smokes half a pack per day for "decades." -Nicotine patch 21mg  -Will counsel on the importance of cessation  Dispo: Disposition is deferred at this time, awaiting improvement of current medical problems.  The patient does have a  current PCP [name], MD) and does need an [facility] hospital follow-up appointment after discharge.  The patient does not know have transportation limitations that hinder transportation to clinic appointments.  Signed: Loleta Chance, MD 05/15/2015, 1:44 PM

## 2015-05-15 NOTE — Progress Notes (Signed)
Lab called to state that pt's K 2.1. Pt's nurse, Ruben Reason currently on phone talking with MD about pt's low K. Will continue to monitor pt. Ranelle Oyster, RN (charge nurse)

## 2015-05-15 NOTE — Patient Instructions (Addendum)
-  Will check your bloodwork today to see why your skin and eyes are yellow  -Stop drinking alcohol  -Will refer you back to neurology for your seizures and check your keppra levels today -Will refer you to our social worker Edwena Blow to help set you up for seeing someone for depression  -Take half a tablet of HCTZ instead of 1 tablet for blood pressure, see if this helps your lightheadedness      Jaundice  Jaundice is when the skin, whites of the eyes, and mucous membranes turn a yellowish color. It is caused by high levels of bilirubin in the blood. Bilirubin is produced by the normal breakdown of red blood cells. Jaundice may mean the liver or bile system in your body is not working right. HOME CARE  Rest.  Drink enough fluids to keep your pee (urine) clear or pale yellow.  Do not drink alcohol.  Only take medicine as told by your doctor.  If you have jaundice because of viral hepatitis or an infection:  Avoid close contact with people.  Avoid making food for others.  Avoid sharing eating utensils with others.  Wash your hands often.  Keep all follow-up visits with your doctor.  Use skin lotion to help with itching. GET HELP RIGHT AWAY IF:  You have more pain.  You keep throwing up (vomiting).  You lose too much body fluid (dehydration).  You have a fever or persistent symptoms for more than 72 hours.  You have a fever and your symptoms suddenly get worse.  You become weak or confused.  You develop a severe headache. MAKE SURE YOU:  Understand these instructions.  Will watch your condition.  Will get help right away if you are not doing well or get worse. Document Released: 10/15/2010 Document Revised: 12/05/2011 Document Reviewed: 10/15/2010 [facility] Patient Information 2015 [place], [place]. This information is not intended to replace advice given to you by your health care provider. Make sure you discuss any questions you have with your health care  provider.   General Instructions:   Thank you for bringing your medicines today. This helps [place] keep you safe from mistakes.   Progress Toward Treatment Goals:  Treatment Goal 06/25/2014  Blood pressure at goal  Stop smoking smoking the same amount    Self Care Goals & Plans:  Self Care Goal 04/14/2014  Manage my medications take my medicines as prescribed; bring my medications to every visit; refill my medications on time  Monitor my health -  Eat healthy foods drink diet soda or water instead of juice or soda; eat more vegetables; eat foods that are low in salt; eat baked foods instead of fried foods; eat fruit for snacks and desserts  Be physically active -  Stop smoking call [facility] (1-800-QUIT-NOW)  Other -    No flowsheet data found.   Care Management & Community Referrals:  No flowsheet data found.

## 2015-05-15 NOTE — Progress Notes (Signed)
Dr notified of k 2.1 pt on third run of potassium from previous order. Pt can only tolerate the k runs over 2 hours each bag. Pt w/o complaints of chest pains, muscle aches. Vss. Will cont. To monitor.

## 2015-05-15 NOTE — Assessment & Plan Note (Addendum)
Assessment: Pt with poorly-controlled depression compliant with TCA therapy who presents with persistent depression without suicide ideation.   Plan:  -Refer to Education officer, museum for options for behavioral health, Golden Hurter met with pt today and gave her materials -Continue amitriptyline 25 mg daily with further adjustments in medical therapy by psychiatry

## 2015-05-15 NOTE — Assessment & Plan Note (Signed)
-  Pt scheduled for screening mammogram on 06/16/15 -Inquire about annual influenza vaccination and tdap at next visit

## 2015-05-15 NOTE — Progress Notes (Signed)
Patient ID: Shelia Arnold, female   DOB: Mar 22, 1966, 49 y.o.   MRN: [id number]    Subjective:   Patient ID: Shelia Arnold female   DOB: 1966-08-30 49 y.o.   MRN: [id number]  HPI: Shelia Arnold is a 49 y.o. pleasant woman with past medical history hypertension, seizure disorder, chronic low back pain, alcohol use, and depression who presents with chief complaint of yellowing of her skin and eyes.   She reports first noticing yellowing of her skin and eyes more than 1 month ago. She also has been having mild RUQ pain, pruritis, 8 lb weight loss in 9 months, decreased appetite, fatigue, dark urine, lightheadedness, and occasional dyspnea. She denies nausea, vomiting, change in BM, fever, chills, bleeding, joint pain, night sweats, tender lymph nodes, confusion, or rash. She denies history of liver disease, hepatitis B or C infection, HIV, autoimmune disease, IVDU, blood transfusion, or malignancy. She drinks between 0-4 beers daily for many years. She occasionally smokes marijuana. Her last US abdomen on 04/11/07 revealed cholelithiasis with dilated common bile duct of 6.6 mm in diameter with no visualized choledocholithiasis.   She is compliant with taking HCTZ for hypertension. She has lightheadedness with standing but denies blurry vision, chest pain, or LE edema.    She has history of seizure disorder and is compliant with taking keppra with last seizure (three) in the end of June. She saw neurology November of 2015 but has not followed up since then.   She reports being depressed with fatigue and insomnia. She reports compliance with taking amitriptyline but does not feel that it is working. She has not seen mental health services in the past and is interested in going.     Past Medical History  Diagnosis Date  . Seizure   . Depression   . Hypertension    Current Outpatient Prescriptions  Medication Sig Dispense Refill  . acetaminophen (TYLENOL) 325 MG tablet Take 2  tablets (650 mg total) by mouth every 6 (six) hours as needed for pain (for headache). 30 tablet 3  . amitriptyline (ELAVIL) 25 MG tablet TAKE ONE TABLET BY MOUTH AT BEDTIME 90 tablet 3  . diclofenac (FLECTOR) 1.3 % PTCH PLACE 1 PATCH ONTO THE SKIN TWICE DAILY 180 patch 3  . diclofenac sodium (VOLTAREN) 1 % GEL Apply 2 g topically 4 (four) times daily. 100 g 5  . hydrochlorothiazide (HYDRODIURIL) 25 MG tablet TAKE 1 TABLET BY MOUTH EVERY DAY 90 tablet 4  . levETIRAcetam (KEPPRA) 1000 MG tablet TAKE 1& 1/2 TABLETS BY MOUTH EVERY 12 HOURS 90 tablet 1   No current facility-administered medications for this visit.   No family history on file. Social History   Social History  . Marital Status: Single    Spouse Name: N/A  . Number of Children: N/A  . Years of Education: N/A   Social History Main Topics  . Smoking status: Current Every Day Smoker -- 0.50 packs/day for 15 years    Types: Cigarettes  . Smokeless tobacco: Not on file     Comment: trying to cut back a little each day  . Alcohol Use: 12.0 oz/week    20 Cans of beer per week     Comment: 2 beers a night  . Drug Use: No  . Sexual Activity: Not on file   Other Topics Concern  . Not on file   Social History Narrative   Single, does not regularly exercise, and completed the 7th grade.  Mother- dec.- died during childbirth, birth trauma   Brother- epilepsy- dec- 12's   Father- unknown   No known history of cancer in 1st degree family members   Review of Systems: Review of Systems  Constitutional: Positive for weight loss and malaise/fatigue. Negative for fever and chills.  HENT: Negative for sore throat.   Eyes: Negative for blurred vision.  Respiratory: Positive for shortness of breath (occasionally ). Negative for cough and wheezing.   Cardiovascular: Negative for chest pain and leg swelling.  Gastrointestinal: Positive for abdominal pain (mild RUQ ). Negative for nausea, vomiting, diarrhea, constipation, blood in  stool and melena.  Genitourinary: Negative for dysuria, urgency, frequency and hematuria.       Dark urine for 1 month  Musculoskeletal: Positive for back pain (chronic low). Negative for myalgias and joint pain.  Skin: Positive for itching (for 3-4 months ). Negative for rash.  Neurological: Positive for dizziness (lightheadeness with standing) and seizures (last seizure 2 months ago). Negative for headaches.  Endo/Heme/Allergies: Does not bruise/bleed easily.  Psychiatric/Behavioral: Positive for depression and substance abuse (THC and alcohol ). Negative for suicidal ideas. The patient has insomnia.      Objective:  Physical Exam: Filed Vitals:   05/15/15 1101  BP: 112/73  Pulse: 96  Temp: 98.6 F (37 C)  TempSrc: Oral  Height: 5' (1.524 m)  Weight: 106 lb 3.2 oz (48.172 kg)  SpO2: 100%    Physical Exam  Constitutional: She is oriented to person, place, and time. No distress.  Significantly jaundiced   HENT:  Head: Normocephalic and atraumatic.  Right Ear: External ear normal.  Left Ear: External ear normal.  Nose: Nose normal.  Mouth/Throat: Oropharynx is clear and moist. No oropharyngeal exudate.  Eyes: Conjunctivae and EOM are normal. Pupils are equal, round, and reactive to light. Right eye exhibits no discharge. Left eye exhibits no discharge. Scleral icterus (b/l) is present.  Neck: Normal range of motion. Neck supple.  Cardiovascular: Normal rate, regular rhythm and normal heart sounds.   No murmur heard. Pulmonary/Chest: Effort normal and breath sounds normal. No respiratory distress. She has no wheezes. She has no rales.  Abdominal: Soft. Bowel sounds are normal. She exhibits no distension. There is tenderness (mild in RUQ). There is no rebound and no guarding.  Musculoskeletal: Normal range of motion. She exhibits no edema or tenderness.  Neurological: She is alert and oriented to person, place, and time.  Skin: Skin is warm and dry. No rash noted. She is not  diaphoretic. No erythema. No pallor.  Psychiatric: She has a normal mood and affect. Her behavior is normal. Judgment and thought content normal.    Assessment & Plan:   Please see problem list for problem-based assessment and plan

## 2015-05-15 NOTE — Progress Notes (Addendum)
Patient is a direct admission from MD office to [phone]. She is a/o at this time. Appears tearful. Emotional support given. Family/friend at bedside. Safety precautions and orders reviewed with patient/family. Will continue to monitor.   Ave Filter, RN

## 2015-05-15 NOTE — Progress Notes (Signed)
Patient arrived to unit approximately at 1530 then headed to u/s abdomen. Will administered K+ PIV at 1700. Pharmacist will reschedule for other 3 runs.   Ave Filter, RN

## 2015-05-15 NOTE — Assessment & Plan Note (Addendum)
Assessment: Pt with well-controlled hypertension compliant one-class (diuretic) anti-hypertensive therapy who presents with blood pressure 112/73 with lightheadedness with standing.   Plan:  -BP 112/73 at goal <140/90 -Decrease HCTZ from 25 to 12.5 mg daily in setting of lightheadedness  -Obtain stat CMP ---> K 2.5, pt admitted to Internal Medicine for hyperbilirubinemia

## 2015-05-15 NOTE — Progress Notes (Signed)
MD (Dr. Melburn Hake?, Tx sx)) aware of patient's u/s abd result.   Ave Filter, RN

## 2015-05-15 NOTE — Progress Notes (Signed)
Medicine attending: I personally interviewed and briefly examined this patient, and reviewed pertinent clinical laboratory  data  with resident physician Dr.Marjan Rabbani and we discussed a  management plan. This woman is grossly jaundiced with bilirubin of 27 and needs immediate hospitalization for further evaluation.

## 2015-05-15 NOTE — Assessment & Plan Note (Signed)
Assessment: Pt with moderately well-controlled seizure disorder compliant with AED therapy who presents with last seizure activity 2 months ago.  Plan:  -Obtain keppra level  -Continue keppra 1500 mg BID  -Refer to neurology for management of AED therapy

## 2015-05-15 NOTE — Progress Notes (Signed)
Shelia Arnold was referred to [facility] [facility] and insurance resources for transportation and behavioral health services.  CSW met with Shelia Arnold during scheduled appointment.  Pt states she has been notifying her Medicaid caseworker that she needed SCAT.  CSW informed Shelia Arnold on the differences between SCAT and Medicaid Medical transportation.  Encouraged pt to contact [facility] when scheduling next medical appointment transportation, may need a transportation assessment prior to scheduling.  Pt provided with brochure and contact information for [facility].  Shelia Arnold would also like to apply for [employer], pt does not use the fixed route as she states she becomes dizzy at times waiting at the stop.  CSW provided Shelia Arnold with the SCAT application.  While pt is waiting for lab results, she is completing PART A and signing PART B.  Pt aware CSW will fax completed parts in together as one application.  Mental Health - Pt voiced to physician increased depression with insomnia and fatigue.  Shelia Arnold has not received mental health services in the past.  Pt open to counseling and psychiatry.  CSW discussed coverage and agencies that provide walk-in availability.  Pt provided with information on [place], [facility] [place], [place] and listing of agencies that provide both counseling and med management services.  Pt agreeable to have CSW schedule appointment and would like agency near home.  Pt is currently being admitted to the hospital.  CSW will follow up upon discharge.

## 2015-05-15 NOTE — H&P (Signed)
  Date: 05/15/2015  Patient name: Shelia Arnold  Medical record number: [id number]  Date of birth: 1966/02/22   I have seen and evaluated Shelia Arnold and discussed their care with the Residency Team. Shelia Arnold is a 49 yo female who is being admitted from [facility] with new onset jaundice. She has a h/o ETOH abuse. She stated that a friend told her a few weeks ago that her eyes were turning yellow. Later on, she noticed her urine was becoming quite dark and she made an appointment to be seen. She has not noticed her skin turning yellow, ABD pain, N/V/D, fever, or change in stools. She does endorse pruritus. She states she has lost 10-15 lbs in the past one month which has been unintentional. She denies any h/o liver dz, new meds, IVDU, h/o transfusion, or acetaminophen use. She does drink up to 4 16 oz beers daily. She denies any prior [place] withdrawal szs or DT's. She uses [facility] to stimulate her appetite.   In 2008, she had an U/S which showed cholelithiasis with a dilated common bile duct, but no choledocholithiasis. This was done as part of a W/U for elevated LFT's (AP 177, AST 73, ALT 45, T bili nl). HIV, sed rate, Hep panel, and med levels were also done and were nl. LFT's normalized. She has a sister who has cirrhosis, cause unknown.  She has a sz d/o and is on Keppra, managed by neuro. EEG's have been normal.  Filed Vitals:   05/15/15 1526  BP: 119/73  Pulse: 58  Temp: 97.9 F (36.6 C)  Resp: 18   Gen : icteric, NAD, thin HEENT : icteric sclera, EOMI, palate yellow ABD : mild tenderness RUQ, Live edge below margin. Neg Murphy's Ext no edema Neuro : no focal  Assessment and Plan: I have seen and evaluated the patient as outlined above. I agree with the formulated Assessment and Plan as detailed in the residents' admission note, with the following changes:   1. Jaundice - her work up will start with a RUQ U/S as she has a h/o CBD dilation. This will also show any other cause of  biliary obstruction. Additionally, it will also visualize the liver to see if there are any gross abnl. She is at a risk of cirrhosis and therefore [place] 2/2 her ETOH intake. Hepatitis panel will be repeated along with HIV. She has no reported other intake that would cause this picture of jaundice. Further W/U will be dependent on these results. 2. ETOH use - she has a sig daily h/o ETOH. She reports no h/o DT's. She will be placed on thiamaine and folate and will be on CIWA protocol. 3. Sz history - Her Keppra will be continued if her level is therapeutic and she will be placed on sz precautions. She is at high risk 2/2 her szs hx and ETOH use.   Bartholomew Crews, MD 8/19/20163:55 PM

## 2015-05-16 ENCOUNTER — Other Ambulatory Visit: Payer: Self-pay

## 2015-05-16 ENCOUNTER — Inpatient Hospital Stay (HOSPITAL_COMMUNITY): Payer: Medicaid Other

## 2015-05-16 LAB — BASIC METABOLIC PANEL
Anion gap: 11 (ref 5–15)
BUN: 7 mg/dL (ref 6–20)
CHLORIDE: 84 mmol/L — AB (ref 101–111)
CO2: 28 mmol/L (ref 22–32)
Calcium: 8.5 mg/dL — ABNORMAL LOW (ref 8.9–10.3)
Creatinine, Ser: 0.56 mg/dL (ref 0.44–1.00)
GFR calc Af Amer: >60 mL/min (ref >60)
GFR calc non Af Amer: >60 mL/min (ref >60)
GLUCOSE: 105 mg/dL — AB (ref 65–99)
POTASSIUM: 2.6 mmol/L — AB (ref 3.5–5.1)
Sodium: 123 mmol/L — ABNORMAL LOW (ref 135–145)

## 2015-05-16 LAB — CBC WITH DIFFERENTIAL/PLATELET
BASOS ABS: 0 10*3/uL (ref 0.0–0.2)
Basos: 0 %
EOS (ABSOLUTE): 0 10*3/uL (ref 0.0–0.4)
EOS: 0 %
HEMATOCRIT: 28.8 % — AB (ref 34.0–46.6)
Hemoglobin: 9.6 g/dL — ABNORMAL LOW (ref 11.1–15.9)
IMMATURE GRANULOCYTES: 0 %
Immature Grans (Abs): 0 10*3/uL (ref 0.0–0.1)
LYMPHS ABS: 1.1 10*3/uL (ref 0.7–3.1)
Lymphs: 6 %
MCH: 34 pg — ABNORMAL HIGH (ref 26.6–33.0)
MCHC: 33.3 g/dL (ref 31.5–35.7)
MCV: 102 fL — ABNORMAL HIGH (ref 79–97)
MONOS ABS: 0.9 10*3/uL (ref 0.1–0.9)
Monocytes: 5 %
NEUTROS PCT: 89 %
Neutrophils Absolute: 15.4 10*3/uL — ABNORMAL HIGH (ref 1.4–7.0)
PLATELETS: 795 10*3/uL — AB (ref 150–379)
RBC: 2.82 x10E6/uL — AB (ref 3.77–5.28)
RDW: 16.5 % — AB (ref 12.3–15.4)
WBC: 17.5 10*3/uL — AB (ref 3.4–10.8)

## 2015-05-16 LAB — LIPID PANEL
Cholesterol: 639 mg/dL — ABNORMAL HIGH (ref 0–200)
Triglycerides: 338 mg/dL — ABNORMAL HIGH (ref <150)
VLDL: 68 mg/dL — AB (ref 0–40)

## 2015-05-16 LAB — COMPREHENSIVE METABOLIC PANEL
ALT: 129 U/L — AB (ref 14–54)
ANION GAP: 12 (ref 5–15)
AST: 156 U/L — ABNORMAL HIGH (ref 15–41)
Albumin: 1.9 g/dL — ABNORMAL LOW (ref 3.5–5.0)
Alkaline Phosphatase: 434 U/L — ABNORMAL HIGH (ref 38–126)
BUN: 7 mg/dL (ref 6–20)
CHLORIDE: 87 mmol/L — AB (ref 101–111)
CO2: 24 mmol/L (ref 22–32)
Calcium: 8.5 mg/dL — ABNORMAL LOW (ref 8.9–10.3)
Creatinine, Ser: 0.53 mg/dL (ref 0.44–1.00)
GFR calc non Af Amer: >60 mL/min (ref >60)
Glucose, Bld: 102 mg/dL — ABNORMAL HIGH (ref 65–99)
Potassium: 2.9 mmol/L — ABNORMAL LOW (ref 3.5–5.1)
SODIUM: 123 mmol/L — AB (ref 135–145)
Total Bilirubin: 21.1 mg/dL (ref 0.3–1.2)
Total Protein: 5.9 g/dL — ABNORMAL LOW (ref 6.5–8.1)

## 2015-05-16 LAB — CBC
HCT: 24.5 % — ABNORMAL LOW (ref 36.0–46.0)
HEMOGLOBIN: 8.4 g/dL — AB (ref 12.0–15.0)
MCH: 34 pg (ref 26.0–34.0)
MCHC: 34.3 g/dL (ref 30.0–36.0)
MCV: 99.2 fL (ref 78.0–100.0)
Platelets: 628 10*3/uL — ABNORMAL HIGH (ref 150–400)
RBC: 2.47 MIL/uL — ABNORMAL LOW (ref 3.87–5.11)
RDW: 16.5 % — ABNORMAL HIGH (ref 11.5–15.5)
WBC: 11.5 10*3/uL — ABNORMAL HIGH (ref 4.0–10.5)

## 2015-05-16 LAB — BILIRUBIN, FRACTIONATED(TOT/DIR/INDIR)
BILIRUBIN TOTAL: 22.1 mg/dL — AB (ref 0.3–1.2)
Bilirubin, Direct: 14.1 mg/dL — ABNORMAL HIGH (ref 0.1–0.5)
Indirect Bilirubin: 8 mg/dL — ABNORMAL HIGH (ref 0.3–0.9)

## 2015-05-16 LAB — OSMOLALITY: OSMOLALITY: 259 mosm/kg — AB (ref 275–300)

## 2015-05-16 LAB — HIV ANTIBODY (ROUTINE TESTING W REFLEX): HIV SCREEN 4TH GENERATION: NONREACTIVE

## 2015-05-16 LAB — SODIUM, URINE, RANDOM: Sodium, Ur: 51 mmol/L

## 2015-05-16 LAB — CREATININE, URINE, RANDOM: Creatinine, Urine: 61.01 mg/dL

## 2015-05-16 MED ORDER — GADOBENATE DIMEGLUMINE 529 MG/ML IV SOLN
9.0000 mL | Freq: Once | INTRAVENOUS | Status: AC | PRN
  Administered 2015-05-16: 9 mL via INTRAVENOUS

## 2015-05-16 MED ORDER — POTASSIUM CHLORIDE 10 MEQ/100ML IV SOLN
10.0000 meq | INTRAVENOUS | Status: AC
  Administered 2015-05-16 (×2): 10 meq via INTRAVENOUS
  Filled 2015-05-16 (×2): qty 100

## 2015-05-16 MED ORDER — POTASSIUM CHLORIDE 10 MEQ/100ML IV SOLN
10.0000 meq | INTRAVENOUS | Status: AC
  Filled 2015-05-16 (×2): qty 100

## 2015-05-16 NOTE — Consult Note (Signed)
Subjective:   HPI  The patient is a 49 year old female who we are asked to see in regards to jaundice and a mass in the pancreas. The patient started to notice jaundice a few weeks ago with yellow color of her eyes and she also noticed dark-colored urine. She has also been noticing pruritus. She does have a history of alcohol abuse. She was admitted to the hospital with the new onset of jaundice for further evaluation. Her total bilirubin on admission was 27.6, alkaline phosphatase 575, ALT 169, AST 222. An abdominal ultrasound showed gallstones in the common bile duct was 16 mm. There was suspicion of a mass in the common bile duct. An MRCP was done which shows a mass in the head of the pancreas which is obstructing the common bile duct. This is most compatible with carcinoma. Alpha-fetoprotein is pending.  Review of Systems Denies chest pain or shortness of breath  Past Medical History  Diagnosis Date  . Seizure   . Depression   . Hypertension   . Alcohol abuse    Past Surgical History  Procedure Laterality Date  . No past surgeries     Social History   Social History  . Marital Status: Single    Spouse Name: N/A  . Number of Children: N/A  . Years of Education: N/A   Occupational History  . Not on file.   Social History Main Topics  . Smoking status: Current Every Day Smoker -- 0.50 packs/day for 15 years    Types: Cigarettes  . Smokeless tobacco: Never Used     Comment: trying to cut back a little each day  . Alcohol Use: 12.0 oz/week    20 Cans of beer per week     Comment: 2 beers a night  05/15/2015 " 4 beers a day"  . Drug Use: No  . Sexual Activity: Not on file   Other Topics Concern  . Not on file   Social History Narrative   Single, does not regularly exercise, and completed the 7th grade.      Mother- dec.- died during childbirth, birth trauma   Brother- epilepsy- dec- 25's   Father- unknown   No known history of cancer in 1st degree family members    family history is not on file.  Current facility-administered medications:  .  amitriptyline (ELAVIL) tablet 25 mg, 25 mg, Oral, QHS, Francesca Oman, DO, 25 mg at 05/15/15 2104 .  diphenhydrAMINE (BENADRYL) capsule 25 mg, 25 mg, Oral, Q6H PRN, Loleta Chance, MD .  folic acid (FOLVITE) tablet 1 mg, 1 mg, Oral, Daily, Francesca Oman, DO, 1 mg at 05/16/15 0934 .  levETIRAcetam (KEPPRA) tablet 1,500 mg, 1,500 mg, Oral, BID, Francesca Oman, DO, 1,500 mg at 05/16/15 0934 .  LORazepam (ATIVAN) tablet 0.5 mg, 0.5 mg, Oral, Q6H PRN **OR** LORazepam (ATIVAN) injection 0.5 mg, 0.5 mg, Intravenous, Q6H PRN, Francesca Oman, DO .  multivitamin with minerals tablet 1 tablet, 1 tablet, Oral, Daily, Francesca Oman, DO, 1 tablet at 05/16/15 [phone] .  sodium chloride 0.9 % injection 3 mL, 3 mL, Intravenous, Q12H, Francesca Oman, DO .  sodium chloride 0.9 % injection 3 mL, 3 mL, Intravenous, Q12H, Francesca Oman, DO .  thiamine (VITAMIN B-1) tablet 100 mg, 100 mg, Oral, Daily, 100 mg at 05/16/15 1115 **OR** thiamine (B-1) injection 100 mg, 100 mg, Intravenous, Daily, Francesca Oman, DO No Known Allergies   Objective:     BP 106/75  mmHg  Pulse 85  Temp(Src) 99.2 F (37.3 C) (Oral)  Resp 17  Ht 5' (1.524 m)  Wt 48.172 kg (106 lb 3.2 oz)  BMI 20.74 kg/m2  SpO2 100%  LMP 12/26/2014  She is alert and oriented  She does not appear in any acute distress  Sclera markedly jaundiced  Heart regular rhythm no murmurs  Lungs clear  Abdomen: Bowel sounds normal, soft, nontender, no hepatosplenomegaly  Laboratory No components found for: D1    Assessment:     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 From a further diagnostic standpoint we can proceed with EUS with biopsy of the pancreatic mass. I will discuss this with my partner Dr. Paulita Fujita on Monday and see if we can get these scheduled for  next week.

## 2015-05-16 NOTE — Progress Notes (Addendum)
Patient ID: Shelia Arnold, female   DOB: 1966-08-19, 49 y.o.   MRN: [id number]   Subjective: Ms. Llanas said she was feeling pretty good this morning after getting her MRCP. She was sleepy because people kept waking her up so she wanted to go back to bed. Her itching is better with the Benadryl. We explained this may be cancer and she understood although she has a very depressed affect, understandably so.  Objective: Vital signs in last 24 hours: Filed Vitals:   05/15/15 1526 05/15/15 2122 05/15/15 2346 05/16/15 0627  BP: 119/73 105/63 93/52 132/75  Pulse: 58 88 87 80  Temp: 97.9 F (36.6 C) 98.8 F (37.1 C) 99.5 F (37.5 C) 98.9 F (37.2 C)  TempSrc: Oral Oral Oral Oral  Resp: 18 16 16 16   Height: 5' (1.524 m)     Weight: 48.172 kg (106 lb 3.2 oz)     SpO2: 100% 98% 98% 100%   Weight change:   Intake/Output Summary (Last 24 hours) at 05/16/15 1123 Last data filed at 05/16/15 0843  Gross per 24 hour  Intake   1260 ml  Output    550 ml  Net    710 ml   General: resting in bed, trying to sleep HEENT: overt sceral icterus Cardiac: RRR, no rubs, murmurs or gallops Pulm: clear to auscultation bilaterally, moving normal volumes of air Abd: soft, nontender, nondistended, BS present. No cirrhotic stigmata Ext: she is very jaundiced  Lab Results: Basic Metabolic Panel:  Recent Labs Lab 05/15/15 1818  05/16/15 0131 05/16/15 0458  NA 124*  < > 123* 123*  K 2.1*  < > 2.6* 2.9*  CL 78*  < > 84* 87*  CO2 28  < > 28 24  GLUCOSE 90  < > 105* 102*  BUN 10  < > 7 7  CREATININE 0.60  < > 0.56 0.53  CALCIUM 9.5  < > 8.5* 8.5*  MG 2.9*  --   --   --   < > = values in this interval not displayed.  Liver Function Tests:  Recent Labs Lab 05/15/15 1135 05/16/15 0212 05/16/15 0458  AST 222*  --  156*  ALT 169*  --  129*  ALKPHOS 575*  --  434*  BILITOT 27.6* 22.1* 21.1*  PROT 6.7  --  5.9*  ALBUMIN 2.3*  --  1.9*    Recent Labs Lab 05/15/15 1135  LIPASE 57*    CBC:  Recent Labs Lab 05/15/15 1136 05/15/15 2120 05/16/15 0458  WBC 17.5* 12.7* 11.5*  NEUTROABS 15.4* 10.3*  --   HGB  --  9.2* 8.4*  HCT 28.8* 26.5* 24.5*  MCV  --  98.1 99.2  PLT  --  687* 628*   Fasting Lipid Panel:  Recent Labs Lab 05/16/15 0131  CHOL 639*  HDL <10*  LDLCALC NOT CALCULATED  TRIG 338*  CHOLHDL NOT CALCULATED   Studies/Results: US Abdomen Complete  05/15/2015   CLINICAL DATA:  Jaundice.  EXAM: ULTRASOUND ABDOMEN COMPLETE  COMPARISON:  None.  FINDINGS: Gallbladder: Sludge and gallstones noted. Gallbladder is distended. Gallbladder wall thickness 1.3 mm. Negative Murphy sign.  Common bile duct: Diameter: 16 mm. A 3.1 x 1.6 x 1.2 cm soft tissue mass is noted. No shadowing noted. This could represent tumefactive sludge within the common bile duct or a tumor mass.  Liver: No focal lesion identified. Within normal limits in parenchymal echogenicity. Portal vein is patent.  IVC: No abnormality visualized.  Pancreas: Visualized  portion unremarkable. Portions obscured by bowel gas.  Spleen: Size and appearance within normal limits.  Right Kidney: Length: 11.8 cm. Echogenicity within normal limits. No mass or hydronephrosis visualized.  Left Kidney: Length: 11.7 cm. Echogenicity within normal limits. No mass or hydronephrosis visualized.  Abdominal aorta: No aneurysm visualized.  Other findings: None.  IMPRESSION: 1. Sludge and gallstones noted.  The gallbladder is distended. 2. Common bile duct is distended to 16 mm. There is a a soft tissue mass within the distal common bile duct measuring 3.1 x 1.6 x 1.2 cm. This is consistent with obstructing tumefactive sludge or a tumor. MRCP should be considered for further evaluation. Associated intrahepatic biliary ductal distention noted. These results will be called to the ordering clinician or representative by the Radiologist Assistant, and communication documented in the PACS or zVision Dashboard.   Electronically Signed   By:  Marcello Moores  Register   On: 05/15/2015 17:13   Mr Abd W/wo Cm/mrcp  05/16/2015   CLINICAL DATA:  Jaundice.  Biliary dilatation on recent ultrasound.  EXAM: MRI ABDOMEN WITHOUT AND WITH CONTRAST (INCLUDING MRCP)  TECHNIQUE: Multiplanar multisequence MR imaging of the abdomen was performed both before and after the administration of intravenous contrast. Heavily T2-weighted images of the biliary and pancreatic ducts were obtained, and three-dimensional MRCP images were rendered by post processing.  CONTRAST:  67mL MULTIHANCE GADOBENATE DIMEGLUMINE 529 MG/ML IV SOLN  COMPARISON:  Ultrasound on 05/15/2015  FINDINGS: Lower chest: No acute findings.  Hepatobiliary: No liver masses identified. Diffuse intra and extrahepatic biliary ductal dilatation is seen to the level of the pancreatic head. Common bile duct measures 20 mm in diameter. Abrupt stricture of the distal common bile duct is seen.  Gallbladder is distended and contains small gallstones, however there is no definite evidence of acute cholecystitis.  Pancreas: An irregular hypovascular mass is seen in the pancreatic head measuring 3.6 x 2.5 cm on image 38/series 2001. Pancreatic ductal dilatation is seen within the body and tail.  Spleen:  Within normal limits in size and appearance.  Adrenal Glands:  No masses identified.  Kidneys: No renal masses identified. No evidence of hydronephrosis.  Stomach/Bowel/Peritoneum: No evidence of wall thickening, mass, or obstruction involving visualized abdominal bowel.  Vascular/Lymphatic: No pathologically enlarged lymph nodes identified. No abdominal aortic aneurysm or other significant retroperitoneal abnormality demonstrated.  Other:  None.  Musculoskeletal:  No suspicious bone lesions identified.  IMPRESSION: Hypovascular mass in the pancreatic head measuring 3.6 cm, highly suspicious for pancreatic adenocarcinoma. This obstructs the distal common bile duct causing severe diffuse biliary ductal dilatation.  No definite  evidence of metastatic disease within the abdomen.  Cholelithiasis, without definite signs of acute cholecystitis.   Electronically Signed   By: Earle Gell M.D.   On: 05/16/2015 09:47   Medications: I have reviewed the patient's current medications. Scheduled Meds: . amitriptyline  25 mg Oral QHS  . folic acid  1 mg Oral Daily  . levETIRAcetam  1,500 mg Oral BID  . multivitamin with minerals  1 tablet Oral Daily  . potassium chloride  10 mEq Intravenous Q1 Hr x 2  . sodium chloride  3 mL Intravenous Q12H  . sodium chloride  3 mL Intravenous Q12H  . thiamine  100 mg Oral Daily   Or  . thiamine  100 mg Intravenous Daily   Continuous Infusions:  PRN Meds:.diphenhydrAMINE, LORazepam **OR** LORazepam  Assessment/Plan:  Pancreatic mass: The MRCP this morning showed 3.6cm hypovascular mass in the head of the pancreas,  highly suspicious for pancreatic adenocarcinoma. Her pruritus seems to be well-controlled at this point. I consulted gastroenterology this morning and will follow their recommendations. She is afebrile now and denies any right upper quadrant pain. Should she spike a fever or develop pain, we will need to aggressively treat her empirically for acute cholangitis. -We will follow GI's recommendations and appreciate their input -Continue benadryl for itching -Trend fever and pain level  Pseudohyponatremia and pseudohypokalemia: Her hyponatremia and hypokalemia is likely due to two things: her home thiazide diuretic and even more likely her hypercholesterolemia due to her obstructed common bile duct. A cholesterol molecule called Lipoprotein X has been reported to be elevated in patients with biliary obstruction that can cause falsely decreased sodium and potassium. I suspect these electrolyte abnormalities will resolve once her common bile duct can be drained and her cholesterol levels will subsequently (hopefully) normalize. We will stop fluids now because she is tolerated oral  medications and order a BMP in the morning. If she is hypokalemic we will replete modestly given this is likely falsely low due to the high osmolality of the cholesterol. Her renal function is fine so I'm not too concerned about making her hyperkalemic at this point. -Will stop IV fluids -BMP in the morning  Dispo: Disposition is deferred at this time, awaiting improvement of current medical problems.  The patient does have a current PCP [name], MD) and does need an [facility] hospital follow-up appointment after discharge.  The patient does not know have transportation limitations that hinder transportation to clinic appointments.  .Services Needed at time of discharge: Y = Yes, Blank = No PT:   OT:   RN:   Equipment:   Other:     LOS: 1 day   Loleta Chance, MD 05/16/2015, 11:23 AM

## 2015-05-16 NOTE — Progress Notes (Signed)
Utilization review completed.  

## 2015-05-17 LAB — HEPATITIS PANEL, ACUTE
HEP A IGM: NEGATIVE
HEP B S AG: NEGATIVE
Hep B C IgM: NEGATIVE

## 2015-05-17 LAB — COMPREHENSIVE METABOLIC PANEL
ALK PHOS: 474 U/L — AB (ref 38–126)
ALT: 128 U/L — AB (ref 14–54)
AST: 143 U/L — ABNORMAL HIGH (ref 15–41)
Albumin: 1.9 g/dL — ABNORMAL LOW (ref 3.5–5.0)
Anion gap: 10 (ref 5–15)
BILIRUBIN TOTAL: 21.4 mg/dL — AB (ref 0.3–1.2)
BUN: 6 mg/dL (ref 6–20)
CALCIUM: 9 mg/dL (ref 8.9–10.3)
CO2: 25 mmol/L (ref 22–32)
CREATININE: 0.56 mg/dL (ref 0.44–1.00)
Chloride: 91 mmol/L — ABNORMAL LOW (ref 101–111)
GFR calc non Af Amer: >60 mL/min (ref >60)
Glucose, Bld: 115 mg/dL — ABNORMAL HIGH (ref 65–99)
Potassium: 3 mmol/L — ABNORMAL LOW (ref 3.5–5.1)
Sodium: 126 mmol/L — ABNORMAL LOW (ref 135–145)
Total Protein: 6.6 g/dL (ref 6.5–8.1)

## 2015-05-17 LAB — CANCER ANTIGEN 19-9: CA 19-9: 2 U/mL (ref 0–35)

## 2015-05-17 MED ORDER — POTASSIUM CHLORIDE CRYS ER 20 MEQ PO TBCR: 40.0000 meq | EXTENDED_RELEASE_TABLET | Freq: Once | ORAL | Status: DC

## 2015-05-17 NOTE — Progress Notes (Addendum)
Patient ID: MAANASA ADERHOLD, female   DOB: Apr 14, 1966, 49 y.o.   MRN: [id number]   Subjective: Ms. Kram was seen and examined this AM.  She feels ok, no N/V, itching or abd pain.  Appetite is good.  She understands that the pancreatic mass is concerning for cancer.  Based on her conversation with gastroenterology yesterday, she is under the impression the remaining investigative procedures will take place tomorrow or Tuesday.  She would like to stay in the hospital and get this done as opposed to going home and returning.  Objective: Vital signs in last 24 hours: Filed Vitals:   05/16/15 0627 05/16/15 1307 05/16/15 2201 05/17/15 0558  BP: 132/75 106/75 96/56 94/57   Pulse: 80 85 94 95  Temp: 98.9 F (37.2 C) 99.2 F (37.3 C) 98.6 F (37 C) 98.7 F (37.1 C)  TempSrc: Oral Oral Oral Oral  Resp: 16 17 18 18   Height:      Weight:    108 lb 0.4 oz (49 kg)  SpO2: 100% 100% 100% 99%   Weight change: 1 lb 13.2 oz (0.828 kg)  Intake/Output Summary (Last 24 hours) at 05/17/15 0745 Last data filed at 05/16/15 1300  Gross per 24 hour  Intake    640 ml  Output      0 ml  Net    640 ml   General: resting in bed in NAD HEENT: overt sceral icterus Cardiac: RRR, no rubs, murmurs or gallops Pulm: clear to auscultation bilaterally, moving normal volumes of air Abd: soft, nontender, nondistended, BS present. No cirrhotic stigmata Ext: she is very jaundiced  Lab Results: Basic Metabolic Panel:  Recent Labs Lab 05/15/15 1818  05/16/15 0458 05/17/15 0540  NA 124*  < > 123* 126*  K 2.1*  < > 2.9* 3.0*  CL 78*  < > 87* 91*  CO2 28  < > 24 25  GLUCOSE 90  < > 102* 115*  BUN 10  < > 7 6  CREATININE 0.60  < > 0.53 0.56  CALCIUM 9.5  < > 8.5* 9.0  MG 2.9*  --   --   --   < > = values in this interval not displayed.  Liver Function Tests:  Recent Labs Lab 05/16/15 0458 05/17/15 0540  AST 156* 143*  ALT 129* 128*  ALKPHOS 434* 474*  BILITOT 21.1* 21.4*  PROT 5.9* 6.6    ALBUMIN 1.9* 1.9*   Medications: I have reviewed the patient's current medications. Scheduled Meds: . amitriptyline  25 mg Oral QHS  . folic acid  1 mg Oral Daily  . levETIRAcetam  1,500 mg Oral BID  . multivitamin with minerals  1 tablet Oral Daily  . sodium chloride  3 mL Intravenous Q12H  . sodium chloride  3 mL Intravenous Q12H  . thiamine  100 mg Oral Daily   Or  . thiamine  100 mg Intravenous Daily   Continuous Infusions: none PRN Meds:.diphenhydrAMINE, LORazepam **OR** LORazepam  Assessment/Plan:  Pancreatic mass: MRCP and symptoms concerning for pancreatic cancer.  Asymptomatic (says itching resolved) but overtly jaundice.  Appreciate GI recommendations and planned interventions.   - likely for ERCP and EUS w/ biopsy this week - follow-up GI recs - Consult to Onc once we have more info  Pseudohyponatremia and pseudohypokalemia: likely due to elevated cholesterol in the setting of CBD dilation.  Dispo: Disposition is deferred at this time, awaiting improvement of current medical problems.  The patient does have a current PCP Harrell Gave  Cassie Freer, MD) and does need an [facility] hospital follow-up appointment after discharge.  The patient does not know have transportation limitations that hinder transportation to clinic appointments.  .Services Needed at time of discharge: Y = Yes, Blank = No PT:   OT:   RN:   Equipment:   Other:     LOS: 2 days   Francesca Oman, DO 05/17/2015, 7:45 AM

## 2015-05-18 DIAGNOSIS — E876 Hypokalemia: Secondary | ICD-10-CM

## 2015-05-18 DIAGNOSIS — K868 Other specified diseases of pancreas: Secondary | ICD-10-CM

## 2015-05-18 DIAGNOSIS — E871 Hypo-osmolality and hyponatremia: Secondary | ICD-10-CM

## 2015-05-18 LAB — COMPREHENSIVE METABOLIC PANEL
ALBUMIN: 1.9 g/dL — AB (ref 3.5–5.0)
ALT: 123 U/L — ABNORMAL HIGH (ref 14–54)
ANION GAP: 10 (ref 5–15)
AST: 144 U/L — ABNORMAL HIGH (ref 15–41)
Alkaline Phosphatase: 423 U/L — ABNORMAL HIGH (ref 38–126)
BUN: 7 mg/dL (ref 6–20)
CO2: 24 mmol/L (ref 22–32)
Calcium: 8.8 mg/dL — ABNORMAL LOW (ref 8.9–10.3)
Chloride: 94 mmol/L — ABNORMAL LOW (ref 101–111)
Creatinine, Ser: 0.54 mg/dL (ref 0.44–1.00)
GFR calc Af Amer: >60 mL/min (ref >60)
GFR calc non Af Amer: >60 mL/min (ref >60)
GLUCOSE: 114 mg/dL — AB (ref 65–99)
POTASSIUM: 3 mmol/L — AB (ref 3.5–5.1)
SODIUM: 128 mmol/L — AB (ref 135–145)
TOTAL PROTEIN: 6 g/dL — AB (ref 6.5–8.1)
Total Bilirubin: 18 mg/dL — ABNORMAL HIGH (ref 0.3–1.2)

## 2015-05-18 MED ORDER — VITAMIN B-1 100 MG PO TABS
100.0000 mg | ORAL_TABLET | Freq: Every day | ORAL | Status: DC
  Administered 2015-05-18 – 2015-05-22 (×5): 100 mg via ORAL
  Filled 2015-05-18 (×5): qty 1

## 2015-05-18 NOTE — Discharge Summary (Signed)
Name: Shelia Arnold MRN: [id number] DOB: [birth year] 49 y.o. PCP: [name], MD  Date of Admission: 05/15/2015  3:16 PM Date of Discharge: 05/22/2015 Attending Physician: Bartholomew Crews, MD  Discharge Diagnosis: 1. Pancreatic adenocarcinoma 2. Pseudohyponatremia 3. Seizure disorder  Discharge Medications:   Medication List    TAKE these medications        amitriptyline 25 MG tablet  Commonly known as:  ELAVIL  TAKE ONE TABLET BY MOUTH AT BEDTIME     diclofenac 1.3 % Ptch  Commonly known as:  FLECTOR  PLACE 1 PATCH ONTO THE SKIN TWICE DAILY     diclofenac sodium 1 % Gel  Commonly known as:  VOLTAREN  Apply 2 g topically 4 (four) times daily.     GOLD BOND MEDICATED BODY 5-0.15 % Lotn  Apply 1 application topically daily as needed (dry skin).     hydrochlorothiazide 25 MG tablet  Commonly known as:  HYDRODIURIL  Take 0.5 tablets (12.5 mg total) by mouth daily.     ibuprofen 800 MG tablet  Commonly known as:  ADVIL,MOTRIN  Take 1 tablet (800 mg total) by mouth every 6 (six) hours as needed for mild pain or moderate pain.     levETIRAcetam 1000 MG tablet  Commonly known as:  KEPPRA  TAKE 1& 1/2 TABLETS BY MOUTH EVERY 12 HOURS     multivitamin with minerals Tabs tablet  Take 1 tablet by mouth daily.     traMADol 50 MG tablet  Commonly known as:  ULTRAM  Take 1 tablet (50 mg total) by mouth every 6 (six) hours as needed (Please only give tramadol if ibuprofen does not alleviate her back pain).        Disposition and follow-up:   [name] was discharged from [facility] in Good condition.  At the hospital follow up visit please address:  1. Resolution of her back pain, follow-up with oncology, and her depression in the setting of her recent diagnosis of pancreatic cancer  2.  Labs / imaging needed at time of follow-up: None  3.  Pending labs/ test needing follow-up:  None   Consultations: Gastroenterology Oncology  Procedures Performed:  US Abdomen Complete  05/15/2015   CLINICAL DATA:  Jaundice.  EXAM: ULTRASOUND ABDOMEN COMPLETE  COMPARISON:  None.  FINDINGS: Gallbladder: Sludge and gallstones noted. Gallbladder is distended. Gallbladder wall thickness 1.3 mm. Negative Murphy sign.  Common bile duct: Diameter: 16 mm. A 3.1 x 1.6 x 1.2 cm soft tissue mass is noted. No shadowing noted. This could represent tumefactive sludge within the common bile duct or a tumor mass.  Liver: No focal lesion identified. Within normal limits in parenchymal echogenicity. Portal vein is patent.  IVC: No abnormality visualized.  Pancreas: Visualized portion unremarkable. Portions obscured by bowel gas.  Spleen: Size and appearance within normal limits.  Right Kidney: Length: 11.8 cm. Echogenicity within normal limits. No mass or hydronephrosis visualized.  Left Kidney: Length: 11.7 cm. Echogenicity within normal limits. No mass or hydronephrosis visualized.  Abdominal aorta: No aneurysm visualized.  Other findings: None.  IMPRESSION: 1. Sludge and gallstones noted.  The gallbladder is distended. 2. Common bile duct is distended to 16 mm. There is a a soft tissue mass within the distal common bile duct measuring 3.1 x 1.6 x 1.2 cm. This is consistent with obstructing tumefactive sludge or a tumor. MRCP should be considered for further evaluation. Associated intrahepatic biliary ductal distention noted. These results will be  called to the ordering clinician or representative by the Radiologist Assistant, and communication documented in the PACS or zVision Dashboard.   Electronically Signed   By: Marcello Moores  Register   On: 05/15/2015 17:13   Mr Abd W/wo Cm/mrcp  05/16/2015   CLINICAL DATA:  Jaundice.  Biliary dilatation on recent ultrasound.  EXAM: MRI ABDOMEN WITHOUT AND WITH CONTRAST (INCLUDING MRCP)  TECHNIQUE: Multiplanar multisequence MR imaging of the abdomen was performed both before  and after the administration of intravenous contrast. Heavily T2-weighted images of the biliary and pancreatic ducts were obtained, and three-dimensional MRCP images were rendered by post processing.  CONTRAST:  34m MULTIHANCE GADOBENATE DIMEGLUMINE 529 MG/ML IV SOLN  COMPARISON:  Ultrasound on 05/15/2015  FINDINGS: Lower chest: No acute findings.  Hepatobiliary: No liver masses identified. Diffuse intra and extrahepatic biliary ductal dilatation is seen to the level of the pancreatic head. Common bile duct measures 20 mm in diameter. Abrupt stricture of the distal common bile duct is seen.  Gallbladder is distended and contains small gallstones, however there is no definite evidence of acute cholecystitis.  Pancreas: An irregular hypovascular mass is seen in the pancreatic head measuring 3.6 x 2.5 cm on image 38/series 2001. Pancreatic ductal dilatation is seen within the body and tail.  Spleen:  Within normal limits in size and appearance.  Adrenal Glands:  No masses identified.  Kidneys: No renal masses identified. No evidence of hydronephrosis.  Stomach/Bowel/Peritoneum: No evidence of wall thickening, mass, or obstruction involving visualized abdominal bowel.  Vascular/Lymphatic: No pathologically enlarged lymph nodes identified. No abdominal aortic aneurysm or other significant retroperitoneal abnormality demonstrated.  Other:  None.  Musculoskeletal:  No suspicious bone lesions identified.  IMPRESSION: Hypovascular mass in the pancreatic head measuring 3.6 cm, highly suspicious for pancreatic adenocarcinoma. This obstructs the distal common bile duct causing severe diffuse biliary ductal dilatation.  No definite evidence of metastatic disease within the abdomen.  Cholelithiasis, without definite signs of acute cholecystitis.   Electronically Signed   By: JEarle GellM.D.   On: 05/16/2015 09:47   CT Chest 8/24: FINDINGS: Mediastinum/Nodes: No mediastinal, hilar, or axillary adenopathy. Thoracic aorta is  normal in caliber. The heart size is normal. No pericardial effusion.  Lungs/Pleura: No pulmonary nodule. Scattered emphysematous change primarily in the upper lobes. Trace linear atelectasis in the right lower lobe. No consolidation or pulmonary edema. No pleural effusion.  Upper abdomen: Known pancreatic head mass not included in the field of view, there is pancreatic ductal dilatation. Stomach distended with ingested contents. Biliary stent, partially included. Calcified gallstones within the gallbladder. No focal hepatic lesion in the included liver.  Musculoskeletal: There are no acute or suspicious osseous abnormalities. No lytic or sclerotic lesions.  IMPRESSION: 1. No evidence of metastatic disease in the thorax. 2. Mild emphysema.   Electronically Signed  By: MJeb LeveringM.D.  On: 05/20/2015 23:34  Admission HPI: Ms. SEckelsis a 49year-old African-American lady with history of seizure disorder since childhood, hypertension, depression, alcohol abuse, and tobacco use who presented with a 2 month history of progressively yellowing eyes, bright orange urine, itching that is worse during the heat of the day, 10 pound weight loss, and mild right upper quadrant pain that she attributes to her bra-strap. She said this pain has been present for years and is unchanged over the past two months. She drinks about 4 beers a day and has never had withdrawal symptoms. Notably, her last drink was yesterday afternoon. She also smokes marijuana but has  never used IV drugs. Her sister was an alcoholic who had cirrhosis but nobody else in the family has had liver problems like this. She says she has not been taking any tylenol nor has she taken any new medications recently. She denies any diarrhea or problems with her bowel movements.  Hospital Course by problem list:  1. Pancreatic cancer: Ms. Backes presented with a 2 month history of painless jaundice and diffuse  pruritus, found to have cholestatic liver enzymes with total bilirubin of 27, alk-phos in the 500s, with mildly elevated AST and ALT levels. A right upper quadrant ultrasound performed on 8/19 showed common bile duct dilation. The following day, MRCP showed a hypovascular mass in the pancreatic head, highly suspicious for pancreatic adenocarcinoma. On 8/23, an ERCP was performed: the pancreatic cancer had invaded into the duodenal wall, her bile duct was stented successfully, and a biopsy was obtained that revealed pancreatic adenocarcinoma. Chest CT was negative for metastases. Her bile levels dropped from 27 to 10 during her stay. Oncology saw her and agreed to see her as an outpatient. After her ERCP, she complained of reproducible back pain which we surmised was muskoloskeletal and unlikely to be ERCP-induced pancreatitis. Her pain improved with Tramadol and she was discharged home with 30 pills, without a refill, but this should be re-considered if her pain persists in the setting of pancreatic cancer.  2. Pseudohyponatremia: During her hospitalization she was noted to be hyponatremic to 125 and hypokalemic to 3.5; we deduced this was due to pseudohyponatremia and pseudohypokalemia from her high cholesterol level in the 600s. We discontinued her thiazide and her sodium levels increased slightly to 130. She was asymptomatic during the duration of her stay and had a good appetite so we did not give her IV fluids during her stay.  3. Seizure disorder: We continued her home dose of levetiracetam 554m twice daily. She did not have any seizures while hospitalized.   Discharge Vitals:   BP 116/69 mmHg  Pulse 80  Temp(Src) 98.5 F (36.9 C) (Oral)  Resp 18  Ht 5' (1.524 m)  Wt 49 kg (108 lb 0.4 oz)  BMI 21.10 kg/m2  SpO2 100%  LMP 12/26/2014  Discharge Labs:  Results for orders placed or performed during the hospital encounter of 05/15/15 (from the past 24 hour(s))  Comprehensive metabolic panel      Status: Abnormal   Collection Time: 05/22/15 12:12 PM  Result Value Ref Range   Sodium 128 (L) 135 - 145 mmol/L   Potassium 3.6 3.5 - 5.1 mmol/L   Chloride 95 (L) 101 - 111 mmol/L   CO2 23 22 - 32 mmol/L   Glucose, Bld 195 (H) 65 - 99 mg/dL   BUN 5 (L) 6 - 20 mg/dL   Creatinine, Ser 0.58 0.44 - 1.00 mg/dL   Calcium 9.1 8.9 - 10.3 mg/dL   Total Protein 6.9 6.5 - 8.1 g/dL   Albumin 2.2 (L) 3.5 - 5.0 g/dL   AST 134 (H) 15 - 41 U/L   ALT 140 (H) 14 - 54 U/L   Alkaline Phosphatase 364 (H) 38 - 126 U/L   Total Bilirubin 8.7 (H) 0.3 - 1.2 mg/dL   GFR calc non Af Amer >60 >60 mL/min   GFR calc Af Amer >60 >60 mL/min   Anion gap 10 5 - 15    Signed: KLoleta Chance MD 05/22/2015, 12:38 PM

## 2015-05-18 NOTE — Progress Notes (Signed)
  Date: 05/18/2015  Patient name: Shelia Arnold  Medical record number: [id number]  Date of birth: [birth year]   This patient has been seen and the plan of care was discussed with the house staff. Please see their note for complete details. I concur with their findings with the following additions/corrections: I have reviewed MS 3 Santos-Sanchez's note and will review the resident's note when complete. Ms Mossbarger is looking well. Pruritis was bad last night but currently better. Her appetite is great and she ate all of her breakfast. She understands her medical condition and findings and plans. Pancreatic head mass, likely carcinoma. For ERCP with stent and EUS per gastro.  Bartholomew Crews, MD 05/18/2015, 9:53 AM

## 2015-05-18 NOTE — Progress Notes (Signed)
[facility] Progress Note  Subjective: The patient states she is feeling much better and tolerating diet, itching somewhat improved.  Objective: Vital signs in last 24 hours: Temp:  [97.3 F (36.3 C)-98.5 F (36.9 C)] 98.5 F (36.9 C) (08/22 0420) Pulse Rate:  [70-83] 83 (08/22 0420) Resp:  [16-17] 16 (08/22 0420) BP: (92-103)/(64-66) 97/64 mmHg (08/22 0420) SpO2:  [97 %-100 %] 100 % (08/22 0420) Weight change:    PE: Pleasant alert oriented with dense scleral icterus. Abdomen soft slightly distended.  Lab Results: Results for orders placed or performed during the hospital encounter of 05/15/15 (from the past 24 hour(s))  Comprehensive metabolic panel     Status: Abnormal   Collection Time: 05/18/15  5:15 AM  Result Value Ref Range   Sodium 128 (L) 135 - 145 mmol/L   Potassium 3.0 (L) 3.5 - 5.1 mmol/L   Chloride 94 (L) 101 - 111 mmol/L   CO2 24 22 - 32 mmol/L   Glucose, Bld 114 (H) 65 - 99 mg/dL   BUN 7 6 - 20 mg/dL   Creatinine, Ser 0.54 0.44 - 1.00 mg/dL   Calcium 8.8 (L) 8.9 - 10.3 mg/dL   Total Protein 6.0 (L) 6.5 - 8.1 g/dL   Albumin 1.9 (L) 3.5 - 5.0 g/dL   AST 144 (H) 15 - 41 U/L   ALT 123 (H) 14 - 54 U/L   Alkaline Phosphatase 423 (H) 38 - 126 U/L   Total Bilirubin 18.0 (H) 0.3 - 1.2 mg/dL   GFR calc non Af Amer >60 >60 mL/min   GFR calc Af Amer >60 >60 mL/min   Anion gap 10 5 - 15    Studies/Results: No results found.    Assessment: Obstructive jaundice with mass in the head of the pancreas suspicious for carcinoma Incidental gallstones   Plan: EUS not available in the next few days. Will proceed with ERCP with attempted brush biopsy and stent placement. This was scheduled for 1:00 PM tomorrow. Risks, rationale, and alternatives explained to the patient and she understands and wishes to proceed.    Bita Cartwright C 05/18/2015, 9:02 AM  Pager [phone] If no answer or after 5 PM call [phone]

## 2015-05-18 NOTE — Progress Notes (Signed)
Patient ID: Shelia Arnold, female   DOB: 25-Feb-1966, 49 y.o.   MRN: [id number]   Subjective: [name] said she was feeling okay this morning and is eager to get her tests done so she can go home. The gastroenterologist told her it would either be today or tomorrow. She was itching last night but said it went away on its own. Student Doctor Frederico Hamman and I told her she can take Benadryl if she just pushes her nurse button at any time of day. If that doesn't work, we have other options to control her itching. She ate all her breakfast this morning and has a good appetite. She seems to understand what is going on but is very withdrawn and says she "just wants to rest."    Objective: Vital signs in last 24 hours: Filed Vitals:   05/17/15 0558 05/17/15 1331 05/17/15 2201 05/18/15 0420  BP: 94/57 92/64 103/66 97/64  Pulse: 95 74 70 83  Temp: 98.7 F (37.1 C) 97.3 F (36.3 C) 98.4 F (36.9 C) 98.5 F (36.9 C)  TempSrc: Oral Oral Oral Oral  Resp: 18 17 16 16   Height:      Weight: 49 kg (108 lb 0.4 oz)     SpO2: 99% 97% 100% 100%   General: resting in bed trying to sleep Cardiac: RRR, no rubs, murmurs or gallops Pulm: clear to auscultation anteriorly Ext: jaundiced  Lab Results: Basic Metabolic Panel:  Recent Labs Lab 05/15/15 1818  05/17/15 0540 05/18/15 0515  NA 124*  < > 126* 128*  K 2.1*  < > 3.0* 3.0*  CL 78*  < > 91* 94*  CO2 28  < > 25 24  GLUCOSE 90  < > 115* 114*  BUN 10  < > 6 7  CREATININE 0.60  < > 0.56 0.54  CALCIUM 9.5  < > 9.0 8.8*  MG 2.9*  --   --   --   < > = values in this interval not displayed.   Liver Function Tests:  Recent Labs Lab 05/17/15 0540 05/18/15 0515  AST 143* 144*  ALT 128* 123*  ALKPHOS 474* 423*  BILITOT 21.4* 18.0*  PROT 6.6 6.0*  ALBUMIN 1.9* 1.9*   Medications: I have reviewed the patient's current medications. Scheduled Meds: . amitriptyline  25 mg Oral QHS  . folic acid  1 mg Oral Daily  . levETIRAcetam  1,500 mg  Oral BID  . multivitamin with minerals  1 tablet Oral Daily  . sodium chloride  3 mL Intravenous Q12H  . sodium chloride  3 mL Intravenous Q12H  . thiamine  100 mg Intravenous Daily  . thiamine  100 mg Oral Daily   Continuous Infusions:  PRN Meds:.diphenhydrAMINE, LORazepam **OR** LORazepam   Assessment/Plan:  Pancreatic mass: The MRCP showed a mass in the head of the pancreas, highly suspicious for pancreatic adenocarcinoma. She is getting diphenhydramine as needed for itching but hasn't used any yet. She will undergo ERCP and EUS either today or tomorrow. We told her to ask if she would like to speak to somebody but she would just like to rest for now. -We will follow GI's recommendations and appreciate their input -Continue benadryl for itching -Trend fever and pain level  Pseudohyponatremia and pseudohypokalemia: Improving after discontinuing thiazide but there is also pseudo-elevation from hypercholesterolemia. She's eating well so we're not giving her IV fluids.  Dispo: Disposition is deferred at this time, awaiting improvement of current medical problems.  The patient does  have a current PCP [name], MD) and does need an [facility] hospital follow-up appointment after discharge.  The patient does not know have transportation limitations that hinder transportation to clinic appointments.  .Services Needed at time of discharge: Y = Yes, Blank = No PT:   OT:   RN:   Equipment:   Other:     LOS: 3 days   Loleta Chance, MD 05/18/2015, 10:22 AM

## 2015-05-18 NOTE — Progress Notes (Signed)
Patient  Walked 66mins and refused her SCD but agrees to take multiple walks during the day

## 2015-05-18 NOTE — Progress Notes (Signed)
Subjective: [name] was doing okay this morning. She reported itching last night, but not this morning. She was not aware that she can ask for Benadryl for her itching so I let her know. She says her appetite has been improving. In fact, I noticed she had eaten all her breakfast this morning. She denied abdominal pain, chest pain, and shortness of breath. She had no questions or concerns this morning.   Objective: Vital signs in last 24 hours: Filed Vitals:   05/17/15 0558 05/17/15 1331 05/17/15 2201 05/18/15 0420  BP: 94/57 92/64 103/66 97/64  Pulse: 95 74 70 83  Temp: 98.7 F (37.1 C) 97.3 F (36.3 C) 98.4 F (36.9 C) 98.5 F (36.9 C)  TempSrc: Oral Oral Oral Oral  Resp: 18 17 16 16   Height:      Weight: 49 kg (108 lb 0.4 oz)     SpO2: 99% 97% 100% 100%   Weight change:   Intake/Output Summary (Last 24 hours) at 05/18/15 0920 Last data filed at 05/17/15 2130  Gross per 24 hour  Intake    600 ml  Output      0 ml  Net    600 ml   Physical Exam:  Gen: thin female lying in bed watching TV in NAD  HEENT: scleral icterus, yellow palate and tongue  CV: RRR, normal S1 and S2, no murmurs, rubs, or gallops  Pulm: CTAB, no increased work of breathing, no wheezes or crackles  Abd: liver margin palpable below costal margin, soft, non-tender, non-distended, BS+  Ext: warm and well perfused, no peripheral edema  Neuro:  alert and oriented, no focal deficits  Skin: diffuse jaundice, but improving    Lab Results: Results for orders placed or performed during the hospital encounter of 05/15/15 (from the past 24 hour(s))  Comprehensive metabolic panel     Status: Abnormal   Collection Time: 05/18/15  5:15 AM  Result Value Ref Range   Sodium 128 (L) 135 - 145 mmol/L   Potassium 3.0 (L) 3.5 - 5.1 mmol/L   Chloride 94 (L) 101 - 111 mmol/L   CO2 24 22 - 32 mmol/L   Glucose, Bld 114 (H) 65 - 99 mg/dL   BUN 7 6 - 20 mg/dL   Creatinine, Ser 0.54 0.44 - 1.00 mg/dL   Calcium 8.8  (L) 8.9 - 10.3 mg/dL   Total Protein 6.0 (L) 6.5 - 8.1 g/dL   Albumin 1.9 (L) 3.5 - 5.0 g/dL   AST 144 (H) 15 - 41 U/L   ALT 123 (H) 14 - 54 U/L   Alkaline Phosphatase 423 (H) 38 - 126 U/L   Total Bilirubin 18.0 (H) 0.3 - 1.2 mg/dL   GFR calc non Af Amer >60 >60 mL/min   GFR calc Af Amer >60 >60 mL/min   Anion gap 10 5 - 15    Micro Results: No results found for this or any previous visit (from the past 240 hour(s)). Studies/Results: No results found. Medications: I have reviewed the patient's current medications. Scheduled Meds: . amitriptyline  25 mg Oral QHS  . folic acid  1 mg Oral Daily  . levETIRAcetam  1,500 mg Oral BID  . multivitamin with minerals  1 tablet Oral Daily  . sodium chloride  3 mL Intravenous Q12H  . sodium chloride  3 mL Intravenous Q12H  . thiamine  100 mg Intravenous Daily  . 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 Per GI note 8/20, they will try to schedule her for ERCP and stent placement and EUS with biopsy for this week.  - F/u GI recommendations  - Consult Oncology after more information obtained   Pseudohyponatremia and pseudohypokalemia: Most likely due to hypercholesterolemia secondary to CBD dilation.  - Will continue to monitor lytes with daily CMP   Alcohol abuse: No signs or symptoms of alcohol withdrawal  - Continue to monitor CIWA score   Seizure disorder: stable - Continue home Keppra 1500mg  BID    Dispo: Disposition is deferred at this time.   The patient does have a current PCP [name], MD) and does need an [facility] hospital follow-up appointment after discharge.  The patient does not have transportation limitations that hinder transportation to clinic appointments.  .Services Needed at time of discharge: Y = Yes, Blank  = No PT:   OT:   RN:   Equipment:   Other:     LOS: 3 days   Welford Roche, Med Student 05/18/2015, 9:20 AM

## 2015-05-18 NOTE — Care Management Note (Signed)
Case Management Note  Patient Details  Name: Shelia Arnold MRN: [id number] Date of Birth: 1966-06-11  Subjective/Objective:                 Patient from home, alone, self care, ETOH abuse. Presented with jaundice, found to have pancreatic lesion.    Action/Plan:  Will continue to follow and offer resource, HH as needed.   Expected Discharge Date:                  Expected Discharge Plan:  Home/Self Care  In-House Referral:     Discharge planning Services  CM Consult  Post Acute Care Choice:    Choice offered to:     DME Arranged:    DME Agency:     HH Arranged:    HH Agency:     Status of Service:  In process, will continue to follow  Medicare Important Message Given:    Date Medicare IM Given:    Medicare IM give by:    Date Additional Medicare IM Given:    Additional Medicare Important Message give by:     If discussed at [place] of Stay Meetings, dates discussed:    Additional Comments:  Carles Collet, RN 05/18/2015, 2:19 PM

## 2015-05-19 ENCOUNTER — Inpatient Hospital Stay (HOSPITAL_COMMUNITY): Payer: Medicaid Other

## 2015-05-19 ENCOUNTER — Inpatient Hospital Stay (HOSPITAL_COMMUNITY): Payer: Medicaid Other | Admitting: Anesthesiology

## 2015-05-19 ENCOUNTER — Encounter (HOSPITAL_COMMUNITY)
Admission: AD | Disposition: A | Discharge status: Home / Self Care | Payer: Self-pay | Source: Ambulatory Visit | Attending: Internal Medicine

## 2015-05-19 ENCOUNTER — Encounter (HOSPITAL_COMMUNITY): Payer: Self-pay | Admitting: *Deleted

## 2015-05-19 HISTORY — PX: ERCP: SHX5425

## 2015-05-19 SURGERY — ERCP, WITH INTERVENTION IF INDICATED
Anesthesia: General

## 2015-05-19 MED ORDER — CETAPHIL MOISTURIZING EX LOTN
TOPICAL_LOTION | CUTANEOUS | Status: DC | PRN
  Filled 2015-05-19: qty 473

## 2015-05-19 MED ORDER — SODIUM CHLORIDE 0.9 % IV SOLN
INTRAVENOUS | Status: DC
Start: 2015-05-19 — End: 2015-05-19
  Administered 2015-05-19: 1000 mL via INTRAVENOUS

## 2015-05-19 MED ORDER — GLYCOPYRROLATE 0.2 MG/ML IJ SOLN
INTRAMUSCULAR | Status: DC | PRN
  Administered 2015-05-19: 0.4 mg via INTRAVENOUS

## 2015-05-19 MED ORDER — SODIUM CHLORIDE 0.9 % IV SOLN
INTRAVENOUS | Status: DC | PRN
  Administered 2015-05-19: 30 mL

## 2015-05-19 MED ORDER — MIDAZOLAM HCL 5 MG/5ML IJ SOLN
INTRAMUSCULAR | Status: DC | PRN
  Administered 2015-05-19: 2 mg via INTRAVENOUS

## 2015-05-19 MED ORDER — ROCURONIUM BROMIDE 100 MG/10ML IV SOLN
INTRAVENOUS | Status: DC | PRN
  Administered 2015-05-19: 10 mg via INTRAVENOUS

## 2015-05-19 MED ORDER — SUCCINYLCHOLINE CHLORIDE 20 MG/ML IJ SOLN
INTRAMUSCULAR | Status: DC | PRN
  Administered 2015-05-19: 80 mg via INTRAVENOUS

## 2015-05-19 MED ORDER — NEOSTIGMINE METHYLSULFATE 10 MG/10ML IV SOLN
INTRAVENOUS | Status: DC | PRN
  Administered 2015-05-19: 3 mg via INTRAVENOUS

## 2015-05-19 MED ORDER — PHENYLEPHRINE HCL 10 MG/ML IJ SOLN
INTRAMUSCULAR | Status: DC | PRN
  Administered 2015-05-19: 80 ug via INTRAVENOUS
  Administered 2015-05-19: 40 ug via INTRAVENOUS

## 2015-05-19 MED ORDER — SODIUM CHLORIDE 0.9 % IV SOLN
1.5000 g | Freq: Once | INTRAVENOUS | Status: AC
  Administered 2015-05-19: 1.5 g via INTRAVENOUS
  Filled 2015-05-19: qty 1.5

## 2015-05-19 MED ORDER — FENTANYL CITRATE (PF) 100 MCG/2ML IJ SOLN
INTRAMUSCULAR | Status: DC | PRN
  Administered 2015-05-19 (×2): 50 ug via INTRAVENOUS

## 2015-05-19 MED ORDER — SODIUM CHLORIDE 0.9 % IV SOLN
INTRAVENOUS | Status: DC | PRN
  Administered 2015-05-19: 13:00:00 via INTRAVENOUS

## 2015-05-19 MED ORDER — ONDANSETRON HCL 4 MG/2ML IJ SOLN
INTRAMUSCULAR | Status: DC | PRN
  Administered 2015-05-19: 4 mg via INTRAVENOUS

## 2015-05-19 MED ORDER — PROPOFOL 10 MG/ML IV BOLUS
INTRAVENOUS | Status: DC | PRN
  Administered 2015-05-19: 110 mg via INTRAVENOUS
  Administered 2015-05-19: 20 mg via INTRAVENOUS

## 2015-05-19 MED ORDER — LIDOCAINE HCL (CARDIAC) 20 MG/ML IV SOLN
INTRAVENOUS | Status: DC | PRN
  Administered 2015-05-19: 80 mg via INTRAVENOUS

## 2015-05-19 MED ORDER — KETOROLAC TROMETHAMINE 30 MG/ML IJ SOLN
30.0000 mg | Freq: Once | INTRAMUSCULAR | Status: AC
  Administered 2015-05-19: 30 mg via INTRAVENOUS
  Filled 2015-05-19: qty 1

## 2015-05-19 NOTE — Progress Notes (Signed)
Utilization Review completed. Elva Breaker RN BSN CM 

## 2015-05-19 NOTE — Anesthesia Procedure Notes (Signed)
Procedure Name: Intubation Date/Time: 05/19/2015 1:17 PM Performed by: Melina Copa, Talmage Teaster R Pre-anesthesia Checklist: Patient identified, Emergency Drugs available, Suction available, Patient being monitored and Timeout performed Patient Re-evaluated:Patient Re-evaluated prior to inductionOxygen Delivery Method: Circle system utilized Preoxygenation: Pre-oxygenation with 100% oxygen Intubation Type: IV induction Ventilation: Mask ventilation without difficulty Laryngoscope Size: Mac and 3 Grade View: Grade II Tube type: Oral Number of attempts: 1 Airway Equipment and Method: Stylet Placement Confirmation: ETT inserted through vocal cords under direct vision,  positive ETCO2 and breath sounds checked- equal and bilateral Secured at: 20 cm Tube secured with: Tape Dental Injury: Teeth and Oropharynx as per pre-operative assessment

## 2015-05-19 NOTE — Transfer of Care (Signed)
Immediate Anesthesia Transfer of Care Note  Patient: Shelia Arnold  Procedure(s) Performed: Procedure(s): ENDOSCOPIC RETROGRADE CHOLANGIOPANCREATOGRAPHY (ERCP) (N/A)  Patient Location: Endoscopy Unit  Anesthesia Type:General  Level of Consciousness: awake, alert , oriented and patient cooperative  Airway & Oxygen Therapy: Patient Spontanous Breathing  Post-op Assessment: Report given to RN, Post -op Vital signs reviewed and stable and Patient moving all extremities  Post vital signs: Reviewed and stable  Last Vitals:  Filed Vitals:   05/19/15 1220  BP:   Pulse: 71  Temp: 37.1 C  Resp: 13    Complications: No apparent anesthesia complications

## 2015-05-19 NOTE — Op Note (Signed)
[facility] [place], [id number]   ERCP PROCEDURE REPORT        EXAM DATE: 05/19/2015  PATIENT NAME:          Shelia Arnold, Shelia Arnold          MR #: [id number] BIRTHDATE:       02-Nov-1965     VISIT #:     [id number] ATTENDING:     Teena Irani, MD     STATUS:     outpatient ASSISTANT:      Jiles Harold and Cleda Daub   INDICATIONS:  The patient is a 49 yr old female here for an ERCP due to  obstructive jaundice an pancreatic head mass PROCEDURE PERFORMED:      ERCP with sphincterotomy, stent placement and biopsy of duodenal mass MEDICATIONS:      general anesthesia  CONSENT: The patient understands the risks and benefits of the procedure and understands that these risks include, but are not limited to: sedation, allergic reaction, infection, perforation and/or bleeding. Alternative means of evaluation and treatment include, among others: physical exam, x-rays, and/or surgical intervention. The patient elects to proceed with this endoscopic procedure.  DESCRIPTION OF PROCEDURE: During intra-op preparation period all mechanical & medical equipment was checked for proper function. Hand hygiene and appropriate measures for infection prevention was taken. After the risks, benefits and alternatives of the procedure were thoroughly explained, Informed was verified, confirmed and timeout was successfully executed by the treatment team. With the patient in left semi-prone position, medications were administered intravenously.The Pentax Ercp Scope [id number] was passed from the mouth into the esophagus and further advanced from the esophagus into the stomach. From stomach scope was directed to the  papilla of Vater.     in route there was seen a large exophytic mass in the medial duodenum in the distal bulb with a central significant crater. this was consistent with pancreatic tumor invading the duodenal wall. it did not present prevent  passage of the scope beyond it into the second portion of the duodenum where the major papilla was seen and had a relatively normal appearance with a significant intraduodenal segment. it was cannulated at first shallow cannulation with the sphincterotome and guidewire. further advancement of the catheter and guidewire entered the pancreatic duct and eventually selective cannulation of the common bile duct was achieved after opacification of this common bile duct. It had a diffuse [place] dilatation with a 2 cm distal stricture. no stones were seen. A sphincterotomy was performed. a 10 French 6 mm coated metal stent was then placed across the stricture and appeared to be in good position with good flow of dye in some bile when the stent was deployed. the duodenal mass was then addressed and several biopsies were taken. The scope was then withdrawn and the patient returned to the recovery room in stable condition. .  Major papilla was aligned with the duodenoscope. The scope position was confirmed fluoroscopically. Rest of the findings/therapeutics are given below. The scope was then completely withdrawn from the patient and the procedure completed. The pulse, BP, and O2 saturation were monitored and documented by the physician and the nursing staff throughout the entire procedure. The patient was cared for as planned according to standard protocol. The patient was then discharged to recovery in stable condition and with appropriate post procedure care. Estimated blood loss is zero unless otherwise noted in this procedure report.      ADVERSE EVENT:  none immediate IMPRESSIONS:      malignant-appearing mass invading the duodenal wall. 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  RECOMMENDATIONS:      await biopsy results Check liver function tests for  decompression of biliary obstruction Observe for complications Eventual oncology consult. REPEAT EXAM:   ___________________________________ Teena Irani, MD eSigned:  Teena Irani, MD 06/02/15 2:18 PM   cc:  CPT CODES: ICD9 CODES:  The ICD and CPT codes recommended by this software are interpretations from the data that the clinical staff has captured with the software.  The verification of the translation of this report to the ICD and CPT codes and modifiers is the sole responsibility of the health care institution and practicing physician where this report was generated.  [employer] will not be held responsible for the validity of the ICD and CPT codes included on this report.  AMA assumes no liability for data contained or not contained herein. CPT is a Designer, television/film set of the [employer].   PATIENT NAME:  [name], [name] MR#: [id number]

## 2015-05-19 NOTE — Anesthesia Postprocedure Evaluation (Signed)
  Anesthesia Post-op Note  Patient: Shelia Arnold  Procedure(s) Performed: Procedure(s): ENDOSCOPIC RETROGRADE CHOLANGIOPANCREATOGRAPHY (ERCP) (N/A)  Patient Location: Endoscopy Unit  Anesthesia Type:General  Level of Consciousness: awake, alert , oriented and patient cooperative  Airway and Oxygen Therapy: Patient Spontanous Breathing  Post-op Pain: none  Post-op Assessment: Post-op Vital signs reviewed, Patient's Cardiovascular Status Stable, Respiratory Function Stable, Patent Airway, No signs of Nausea or vomiting and Pain level controlled              Post-op Vital Signs: Reviewed and stable  Last Vitals:  Filed Vitals:   05/19/15 1220  BP:   Pulse: 71  Temp: 37.1 C  Resp: 13    Complications: No apparent anesthesia complications

## 2015-05-19 NOTE — Anesthesia Preprocedure Evaluation (Addendum)
Anesthesia Evaluation  Patient identified by MRN, date of birth, ID band Patient awake    Reviewed: Allergy & Precautions, NPO status , Patient's Chart, lab work & pertinent test results  History of Anesthesia Complications Negative for: history of anesthetic complications  Airway Mallampati: II  TM Distance: >3 FB Neck ROM: Full    Dental  (+) Dental Advisory Given, Teeth Intact   Pulmonary Current Smoker,  breath sounds clear to auscultation        Cardiovascular hypertension, Pt. on medications Rhythm:Regular Rate:Normal     Neuro/Psych Seizures -,  PSYCHIATRIC DISORDERS Depression    GI/Hepatic negative GI ROS, Neg liver ROS,   Endo/Other  negative endocrine ROS  Renal/GU negative Renal ROS  negative genitourinary   Musculoskeletal negative musculoskeletal ROS (+)   Abdominal   Peds negative pediatric ROS (+)  Hematology negative hematology ROS (+)   Anesthesia Other Findings   Reproductive/Obstetrics negative OB ROS                           Lab Results  Component Value Date   WBC 11.5* 05/16/2015   HGB 8.4* 05/16/2015   HCT 24.5* 05/16/2015   MCV 99.2 05/16/2015   PLT 628* 05/16/2015   Lab Results  Component Value Date   CREATININE 0.54 05/18/2015   BUN 7 05/18/2015   NA 128* 05/18/2015   K 3.0* 05/18/2015   CL 94* 05/18/2015   CO2 24 05/18/2015   Lab Results  Component Value Date   INR 1.05 05/15/2015    Anesthesia Physical Anesthesia Plan  ASA: II  Anesthesia Plan: General   Post-op Pain Management:    Induction: Intravenous  Airway Management Planned: Oral ETT  Additional Equipment:   Intra-op Plan:   Post-operative Plan:   Informed Consent: I have reviewed the patients History and Physical, chart, labs and discussed the procedure including the risks, benefits and alternatives for the proposed anesthesia with the patient or authorized representative  who has indicated his/her understanding and acceptance.   Dental advisory given  Plan Discussed with: CRNA  Anesthesia Plan Comments:         Anesthesia Quick Evaluation

## 2015-05-19 NOTE — Progress Notes (Signed)
   Subjective: Ms. Oblinger was doing very well this morning. She appeared to be in a better mood and we had a pleasant conversation this morning. Her appetite is improving and she is gaining some weight. She continues to itch at night but reports it's not too bad. She has not used Benadryl for her itching because she prefers lotion. She denied abdominal pain, chest pain, and shortness of breath.   Objective: Vital signs in last 24 hours: Filed Vitals:   05/18/15 1835 05/18/15 2150 05/19/15 0002 05/19/15 0519  BP: 100/69 96/60 92/49  94/61  Pulse: 68 78 72 72  Temp: 98.3 F (36.8 C) 98.6 F (37 C) 97.7 F (36.5 C) 98.6 F (37 C)  TempSrc: Oral Oral Oral Oral  Resp: 18 18 20 20   Height:      Weight:    52 kg (114 lb 10.2 oz)  SpO2: 100% 100% 100% 100%   Weight change:   Intake/Output Summary (Last 24 hours) at 05/19/15 0759 Last data filed at 05/19/15 0758  Gross per 24 hour  Intake    120 ml  Output    900 ml  Net   -780 ml   Physical Exam:  Gen: thin female sitting in bed watching TV in NAD  HEENT: scleral icterus improving  CV: RRR, normal S1 and S2, no murmurs  Pulm: CTAB, no increased work of breathing, no wheezes or crackles  Abd: soft, non-tender, non-distended, BS+ Ext: warm and well perfused, no pitting edema  Neuro: alert and oriented, no focal deficits Skin: diffuse jaundice, but improving   Lab Results: No results found for this or any previous visit (from the past 24 hour(s)).  Micro Results: No results found for this or any previous visit (from the past 240 hour(s)). Studies/Results: No results found. Medications: I have reviewed the patient's current medications. Scheduled Meds: . amitriptyline  25 mg Oral QHS  . folic acid  1 mg Oral Daily  . levETIRAcetam  1,500 mg Oral BID  . multivitamin with minerals  1 tablet Oral Daily  . sodium chloride  3 mL Intravenous Q12H  . sodium chloride  3 mL Intravenous Q12H  . 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 Per GI note, ERCP with stent placement scheduled for today at 1pm.  - F/u GI recommendations  - Consult Oncology after more information obtained   Pseudohyponatremia and pseudohypokalemia: Most likely due to hypercholesterolemia secondary to CBD dilation.  - Will continue to monitor lytes with daily CMP   Pruritus:  - Cetaphil lotion PRN instead of Benadryl due to patient preference  Alcohol abuse: No signs or symptoms of alcohol withdrawal  - Continue to monitor CIWA score   Seizure disorder: stable - Continue home Keppra 1500mg  BID   Dispo: Disposition is deferred at this time.   The patient does have a current PCP [name], MD) and does need an [facility] hospital follow-up appointment after discharge.  The patient does have transportation limitations that hinder transportation to clinic appointments.  .Services Needed at time of discharge: Y = Yes, Blank = No PT:   OT:   RN:   Equipment:   Other:     LOS: 4 days   Welford Roche, Med Student 05/19/2015, 7:59 AM

## 2015-05-19 NOTE — Progress Notes (Addendum)
Patient ID: Shelia Arnold, female   DOB: 1965/12/01, 49 y.o.   MRN: [id number]   Subjective: Shelia Arnold was a little more energetic this morning. She said she's ready to get her test done today at 1pm and wants to know what's going on inside of her. She said she doesn't want all of her insides pulled out and was very anxious about this happening, so I reassured her they wouldn't be doing anything like that this afternoon. She was itching a little bit and said Gold Bond was helping her at home so I gave her some calamine lotion. She denies abdominal pain, chills, or other complaints.  Objective: Vital signs in last 24 hours: Filed Vitals:   05/18/15 1835 05/18/15 2150 05/19/15 0002 05/19/15 0519  BP: 100/69 96/60 92/49  94/61  Pulse: 68 78 72 72  Temp: 98.3 F (36.8 C) 98.6 F (37 C) 97.7 F (36.5 C) 98.6 F (37 C)  TempSrc: Oral Oral Oral Oral  Resp: 18 18 20 20   Height:      Weight:    52 kg (114 lb 10.2 oz)  SpO2: 100% 100% 100% 100%   General: resting in bed, just got a bath HEENT: scleral icterus Cardiac: RRR, no rubs, murmurs or gallops Pulm: clear to auscultation anteriorly, moving normal volumes of air Abd: enlarged liver, still non-tender Ext: jaundiced  Lab Results: Basic Metabolic Panel:  Recent Labs Lab 05/15/15 1818  05/17/15 0540 05/18/15 0515  NA 124*  < > 126* 128*  K 2.1*  < > 3.0* 3.0*  CL 78*  < > 91* 94*  CO2 28  < > 25 24  GLUCOSE 90  < > 115* 114*  BUN 10  < > 6 7  CREATININE 0.60  < > 0.56 0.54  CALCIUM 9.5  < > 9.0 8.8*  MG 2.9*  --   --   --   < > = values in this interval not displayed.  Medications: I have reviewed the patient's current medications. Scheduled Meds: . amitriptyline  25 mg Oral QHS  . folic acid  1 mg Oral Daily  . levETIRAcetam  1,500 mg Oral BID  . multivitamin with minerals  1 tablet Oral Daily  . sodium chloride  3 mL Intravenous Q12H  . sodium chloride  3 mL Intravenous Q12H  . thiamine  100 mg Oral Daily    Continuous Infusions:  PRN Meds:.cetaphil   Assessment/Plan:  Pancreatic mass: MRCP showed a mass in the head of the pancreas, highly suspicious for pancreatic cancer. She's be getting ERCP this afternoon. Pending those results, she'll likely be clear to go home today and follow-up as outpatient for her EUS. She was itching a little bit overnight but didn't want the benadryl. She said she was using Gold Bond at home which helped; we don't have Gold Bond but I gave her some calamine lotion to help keep her comfortably. -Thank you GI for your help in caring for Shelia Arnold -ERCP this afternoon at 1pm -She may be clear for discharge this afternoon  Dispo: Disposition is deferred at this time, awaiting improvement of current medical problems.   The patient does have a current PCP [name], MD) and does need an [facility] hospital follow-up appointment after discharge.  The patient does not know have transportation limitations that hinder transportation to clinic appointments.  .Services Needed at time of discharge: Y = Yes, Blank = No PT:   OT:   RN:   Equipment:  Other:     LOS: 4 days   Loleta Chance, MD 05/19/2015, 7:48 AM

## 2015-05-20 ENCOUNTER — Inpatient Hospital Stay (HOSPITAL_COMMUNITY): Payer: Medicaid Other

## 2015-05-20 ENCOUNTER — Encounter (HOSPITAL_COMMUNITY): Payer: Self-pay | Admitting: Gastroenterology

## 2015-05-20 DIAGNOSIS — L298 Other pruritus: Secondary | ICD-10-CM

## 2015-05-20 DIAGNOSIS — R454 Irritability and anger: Secondary | ICD-10-CM

## 2015-05-20 DIAGNOSIS — Z720 Tobacco use: Secondary | ICD-10-CM

## 2015-05-20 DIAGNOSIS — C250 Malignant neoplasm of head of pancreas: Secondary | ICD-10-CM

## 2015-05-20 DIAGNOSIS — D630 Anemia in neoplastic disease: Secondary | ICD-10-CM

## 2015-05-20 DIAGNOSIS — D72829 Elevated white blood cell count, unspecified: Secondary | ICD-10-CM

## 2015-05-20 DIAGNOSIS — E46 Unspecified protein-calorie malnutrition: Secondary | ICD-10-CM

## 2015-05-20 DIAGNOSIS — F1099 Alcohol use, unspecified with unspecified alcohol-induced disorder: Secondary | ICD-10-CM

## 2015-05-20 DIAGNOSIS — I10 Essential (primary) hypertension: Secondary | ICD-10-CM

## 2015-05-20 DIAGNOSIS — D473 Essential (hemorrhagic) thrombocythemia: Secondary | ICD-10-CM

## 2015-05-20 DIAGNOSIS — K8689 Other specified diseases of pancreas: Secondary | ICD-10-CM

## 2015-05-20 LAB — LACTATE DEHYDROGENASE: LDH: 176 U/L (ref 98–192)

## 2015-05-20 LAB — LEVETIRACETAM LEVEL: LEVETIRACETAM: 69.6 ug/mL — AB (ref 10.0–40.0)

## 2015-05-20 LAB — COMPREHENSIVE METABOLIC PANEL WITH GFR
ALT: 132 U/L — ABNORMAL HIGH (ref 14–54)
AST: 140 U/L — ABNORMAL HIGH (ref 15–41)
Albumin: 1.9 g/dL — ABNORMAL LOW (ref 3.5–5.0)
Alkaline Phosphatase: 441 U/L — ABNORMAL HIGH (ref 38–126)
Anion gap: 9 (ref 5–15)
BUN: 6 mg/dL (ref 6–20)
CO2: 23 mmol/L (ref 22–32)
Calcium: 8.9 mg/dL (ref 8.9–10.3)
Chloride: 96 mmol/L — ABNORMAL LOW (ref 101–111)
Creatinine, Ser: 0.57 mg/dL (ref 0.44–1.00)
GFR calc Af Amer: >60 mL/min
GFR calc non Af Amer: >60 mL/min
Glucose, Bld: 139 mg/dL — ABNORMAL HIGH (ref 65–99)
Potassium: 3.4 mmol/L — ABNORMAL LOW (ref 3.5–5.1)
Sodium: 128 mmol/L — ABNORMAL LOW (ref 135–145)
Total Bilirubin: 13.2 mg/dL — ABNORMAL HIGH (ref 0.3–1.2)
Total Protein: 5.8 g/dL — ABNORMAL LOW (ref 6.5–8.1)

## 2015-05-20 MED ORDER — KETOROLAC TROMETHAMINE 30 MG/ML IJ SOLN
30.0000 mg | Freq: Once | INTRAMUSCULAR | Status: AC
  Administered 2015-05-20: 30 mg via INTRAVENOUS
  Filled 2015-05-20: qty 1

## 2015-05-20 MED ORDER — IOHEXOL 300 MG/ML  SOLN
75.0000 mL | Freq: Once | INTRAMUSCULAR | Status: AC | PRN
  Administered 2015-05-20: 100 mL via INTRAVENOUS

## 2015-05-20 MED ORDER — DICLOFENAC SODIUM 1 % TD GEL
2.0000 g | Freq: Once | TRANSDERMAL | Status: AC
  Administered 2015-05-20 (×2): 2 g via TOPICAL
  Filled 2015-05-20: qty 100

## 2015-05-20 NOTE — Progress Notes (Signed)
Subjective: [name] seemed very upset this morning. She reported right lower flank/back pain that started yesterday after waking up from her procedure (ERCP). She denied abdominal pain, nausea, and vomiting. When Dr. Melburn Hake mentioned the possibility of going home today, she said she was not going home today because that was not what she had discussed with Dr. Amedeo Plenty this morning. She was very adamant about this and said she wanted to get everything done while she is here because it is difficult to get a ride and she does not want to take the bus. Dr. Melburn Hake let her know that we can arrange transportation for oncology clinic appointment. However, patient desires to stay in the hospital for 1 or 2 days.   Objective: Vital signs in last 24 hours: Filed Vitals:   05/19/15 1440 05/19/15 1442 05/19/15 2110 05/20/15 0557  BP: 95/43 95/43 134/64 149/74  Pulse: 88 89 71 67  Temp:   99.6 F (37.6 C)   TempSrc:   Oral   Resp: 11 15 20 20   Height:      Weight:      SpO2: 100% 100% 100% 100%   Weight change:   Intake/Output Summary (Last 24 hours) at 05/20/15 1020 Last data filed at 05/20/15 0900  Gross per 24 hour  Intake   1540 ml  Output   1705 ml  Net   -165 ml   Physical Exam:  Gen: thin female lying in bed in NAD  HEENT: scleral icterus improving  CV: RRR, normal S1 and S2, no murmurs  Pulm: CTAB, no increased work of breathing, no wheezes or crackles  Abd: soft, non-tender, non-distended, BS+ Ext: warm and well perfused, no pitting edema  Neuro: alert and oriented, no focal deficits Skin: minimal jaundice   Lab Results: Results for orders placed or performed during the hospital encounter of 05/15/15 (from the past 24 hour(s))  Comprehensive metabolic panel     Status: Abnormal   Collection Time: 05/20/15 12:34 AM  Result Value Ref Range   Sodium 128 (L) 135 - 145 mmol/L   Potassium 3.4 (L) 3.5 - 5.1 mmol/L   Chloride 96 (L) 101 - 111 mmol/L   CO2 23 22 - 32 mmol/L    Glucose, Bld 139 (H) 65 - 99 mg/dL   BUN 6 6 - 20 mg/dL   Creatinine, Ser 0.57 0.44 - 1.00 mg/dL   Calcium 8.9 8.9 - 10.3 mg/dL   Total Protein 5.8 (L) 6.5 - 8.1 g/dL   Albumin 1.9 (L) 3.5 - 5.0 g/dL   AST 140 (H) 15 - 41 U/L   ALT 132 (H) 14 - 54 U/L   Alkaline Phosphatase 441 (H) 38 - 126 U/L   Total Bilirubin 13.2 (H) 0.3 - 1.2 mg/dL   GFR calc non Af Amer >60 >60 mL/min   GFR calc Af Amer >60 >60 mL/min   Anion gap 9 5 - 15    Micro Results: No results found for this or any previous visit (from the past 240 hour(s)). Studies/Results: Dg Ercp Biliary & Pancreatic Ducts  05/19/2015   CLINICAL DATA:  Pancreatic head mass with biliary ductal obstruction  EXAM: ERCP  TECHNIQUE: Multiple spot images obtained with the fluoroscopic device and submitted for interpretation post-procedure.  FLUOROSCOPY TIME:  Radiation Exposure Index (as provided by the fluoroscopic device): Not available  If the device does not provide the exposure index:  Fluoroscopy Time:  4 minutes 59 seconds  Number of Acquired Images:  3  COMPARISON:  05/16/2015  FINDINGS: Initial images demonstrate a wire in the common bile duct with contrast material present. Significant ductal dilatation is noted similar to that seen on prior MRI examination. A metallic stent was then placed across the obstructive lesion. Focal narrowing is noted distally in the area of obstruction. The stent appears appropriately placed.  IMPRESSION: Successful metallic stenting of a distal common bile duct obstruction.  These images were submitted for radiologic interpretation only. Please see the procedural report for the amount of contrast and the fluoroscopy time utilized.   Electronically Signed   By: Inez Catalina M.D.   On: 05/19/2015 14:57   Medications: I have reviewed the patient's current medications. Scheduled Meds: . amitriptyline  25 mg Oral QHS  . folic acid  1 mg Oral Daily  . levETIRAcetam  1,500 mg Oral BID  . multivitamin with  minerals  1 tablet Oral Daily  . sodium chloride  3 mL Intravenous Q12H  . sodium chloride  3 mL Intravenous Q12H  . thiamine  100 mg Oral Daily   Continuous Infusions: None  PRN Meds:.cetaphil Assessment/Plan:  Pancreatic mass: ERCP yesterday showed a malignant-appearing mass invading the duodenal wall. Stent was placed and biopsy samples taken. Per Dr. 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 CMP today showed very mild elevation in LFTs (commonly seen after ERCP) and decreased in total bilirubin (18 --> 13.2).   - Oncology consult today  - CMP ordered for tomorrow morning   Pseudohyponatremia and pseudohypokalemia: Most likely due to hypercholesterolemia secondary to CBD dilation. Potassium improved from yesterday (3 --> 3.4).  - Will continue to monitor lytes with daily CMP   Back Pain: started yesterday after her ERCP. Most likely MSK related given that is tender to palpation. Not concerned for pancreatitis at this moment (no nausea or vomiting, no abdominal pain radiating to the back).  - Heating pad  - Voltaren gel  - Will assess pain tomorrow and check for any associated symptoms   Pruritus: improving after ERCP  - Emollient and Cetaphil PRN   Alcohol abuse: No signs or symptoms of alcohol withdrawal  - Continue to monitor CIWA score   Seizure disorder: stable - Continue home Keppra 1500mg  BID   Dispo: Disposition is deferred at this time, awaiting improvement of current medical problems.  Anticipated discharge in approximately 1 day(s). Social worker currently working on transportation.   The patient does have a current PCP [name], MD) and does need an [facility] hospital follow-up appointment after discharge.  The patient does have transportation limitations that hinder transportation to clinic appointments.  .Services Needed at time of discharge: Y = Yes, Blank = No PT:   OT:   RN:   Equipment:   Other:     LOS: 5  days   Welford Roche, Med Student 05/20/2015, 10:20 AM

## 2015-05-20 NOTE — Progress Notes (Signed)
Patient complains of pain 10/10 over right lower back. No PRN pain medications. IM Residency Juleen China MD paged and notified.

## 2015-05-20 NOTE — Consult Note (Signed)
Shelia Arnold  Telephone:([phone]) [place] NOTE  Shelia Arnold                                MR#: [id number]  DOB: 09-16-1966                       CSN#: [id number]  Referring MD: [facility]  Patient Care Team: Shelia Salina, MD as PCP - General  Reason for Consult: Pancreatic Cancer   WIO:MBTDH Shelia Arnold is Shelia 49 y.o. female admitted with 3-4 week history of jaundice with dark colored urine, as well as pruritus. SHe also reported right lower and flank pain.  She denied  nausea or vomiting. She denied any cardiac or respiratory complaints. He appetite was decreased with some weight loss associated with this. On admission, her total bili was elevated at 27.6, as well as high Alk Phos at 575, AST/ALT at 169 and 222 respectively. Abdominal ultrasound on 8/19 showed Shelia possible mass in the CBD. MRCP showed an obstructing, 3.6 cm hypovascular mass in the head of the pancreas obstructing the common bile duct. ERCP with brush biopsy, stent placement was performed on 8/23, with improvement in her liver functions. Preliminary path report is consistent with adenocarcinoma.CA 19.9 is 2. AFP is pending. She denies any history of  pancreatitis or diabetes. No family history of pancreatic cancer. She does have Shelia history history of alcohol intake. Anticipating that she will need oncological follow up, we were asked to see the patient in consultation with recommendations.          PMH:  Past Medical History  Diagnosis Date  . Seizure   . Depression   . Hypertension   . Alcohol abuse     Surgeries:  Past Surgical History  Procedure Laterality Date  . No past surgeries      Allergies: No Known Allergies  Medications:   Scheduled Meds: . amitriptyline  25 mg Oral QHS  . diclofenac sodium  2 g Topical Once  . folic acid  1 mg Oral Daily  . levETIRAcetam  1,500 mg Oral BID  . multivitamin with minerals  1 tablet Oral Daily  .  sodium chloride  3 mL Intravenous Q12H  . sodium chloride  3 mL Intravenous Q12H  . thiamine  100 mg Oral Daily   Continuous Infusions:  PRN Meds:.cetaphil  ROS: Constitutional: Denies fevers, chills or abnormal night sweats Eyes: Denies blurriness of vision, double vision or watery eyes.  Ears, nose, mouth, throat, and face: Denies mucositis or sore throat Respiratory: Denies cough, dyspnea or wheezes Cardiovascular: Denies palpitation, chest discomfort or lower extremity swelling Gastrointestinal: as per HPI Skin: reports pruritus. No rashes Lymphatics: Denies new lymphadenopathy or easy bruising Neurological: Denies numbness, tingling or new weaknesses. She has Shelia history of seizures Behavioral/Psych: Mood is anxious, no new changes.  All other systems were reviewed with the patient and are negative.   Family History:    No family history of hematological  disorders.  Social History:  reports that she has been smoking Cigarettes.  She has Shelia 7.5 pack-year smoking history. She has never used smokeless tobacco. She reports that she drinks about 12.0 oz of alcohol per week. She reports that she does not use illicit drugs. Single, no children. Unemployed.   Physical Exam    Filed Vitals:   05/20/15 7416  BP: 149/74  Pulse: 67  Temp:   Resp: 20   Filed Weights   05/15/15 1526 05/17/15 0558 05/19/15 0519  Weight: 106 lb 3.2 oz (48.172 kg) 108 lb 0.4 oz (49 kg) 114 lb 10.2 oz (52 kg)    GENERAL:alert, no distress and anxious SKIN: skin color is minimally jaundiced, texture, turgor are normal, no rashes or significant lesions EYES: normal, conjunctiva are pink and non-injected, sclera icteric OROPHARYNX:no exudate, no erythema and lips, buccal mucosa, and tongue normal  NECK: supple, thyroid normal size, non-tender, without nodularity LYMPH:  no palpable lymphadenopathy in the cervical, axillary or inguinal area LUNGS: clear to auscultation and percussion with normal  breathing effort HEART: regular rate & rhythm and no murmurs and no lower extremity edema ABDOMEN:abdomen soft, non-tender and normal bowel sounds Musculoskeletal:no cyanosis of digits and no clubbing  PSYCH: alert & oriented x 3 with fluent speech NEURO: no focal motor/sensory deficits   Labs:  CBC   Recent Labs Lab 05/15/15 1136 05/15/15 2120 05/16/15 0458  WBC 17.5* 12.7* 11.5*  HGB  --  9.2* 8.4*  HCT 28.8* 26.5* 24.5*  PLT  --  687* 628*  MCV  --  98.1 99.2  MCH 34.0* 34.1* 34.0  MCHC 33.3 34.7 34.3  RDW 16.5* 16.4* 16.5*  LYMPHSABS 1.1 2.0  --   MONOABS  --  0.4  --   EOSABS  --  0.0  --   BASOSABS 0.0 0.0  --      CMP    Recent Labs Lab 05/15/15 1135 05/15/15 1818  05/16/15 0131 05/16/15 0212 05/16/15 0458 05/17/15 0540 05/18/15 0515 05/20/15 0034  NA 125* 124*  < > 123*  --  123* 126* 128* 128*  K 2.5* 2.1*  < > 2.6*  --  2.9* 3.0* 3.0* 3.4*  CL 76* 78*  < > 84*  --  87* 91* 94* 96*  CO2 33* 28  < > 28  --  _0 GLUCOSE 126* 90  < > 105*  --  102* 115* 114* 139*  BUN <5* 10  < > 7  --  _1 CREATININE 0.71 0.60  < > 0.56  --  0.53 0.56 0.54 0.57  CALCIUM 9.5 9.5  < > 8.5*  --  8.5* 9.0 8.8* 8.9  MG  --  2.9*  --   --   --   --   --   --   --   AST 222*  --   --   --   --  156* 143* 144* 140*  ALT 169*  --   --   --   --  129* 128* 123* 132*  ALKPHOS 575*  --   --   --   --  434* 474* 423* 441*  BILITOT 27.6*  --   --   --  22.1* 21.1* 21.4* 18.0* 13.2*  < > = values in this interval not displayed.      Component Value Date/Time   BILITOT 13.2* 05/20/2015 0034   BILIDIR 14.1* 05/16/2015 0212   IBILI 8.0* 05/16/2015 0212      Recent Labs Lab 05/15/15 1208  INR 1.05     Imaging Studies:  US Abdomen Complete  05/15/2015    COMPARISON:  None.  FINDINGS: Gallbladder: Sludge and gallstones noted. Gallbladder is distended. Gallbladder wall thickness 1.3 mm. Negative Murphy sign.  Common bile duct: Diameter: 16 mm. Shelia 3.1 x 1.6  x 1.2 cm soft tissue mass is noted. No shadowing noted. This could represent tumefactive sludge within the common bile duct or Shelia tumor mass.  Liver: No focal lesion identified. Within normal limits in parenchymal echogenicity. Portal vein is patent.  IVC: No abnormality visualized.  Pancreas: Visualized portion unremarkable. Portions obscured by bowel gas.  Spleen: Size and appearance within normal limits.  Right Kidney: Length: 11.8 cm. Echogenicity within normal limits. No mass or hydronephrosis visualized.  Left Kidney: Length: 11.7 cm. Echogenicity within normal limits. No mass or hydronephrosis visualized.  Abdominal aorta: No aneurysm visualized.  Other findings: None.  IMPRESSION: 1. Sludge and gallstones noted.  The gallbladder is distended. 2. Common bile duct is distended to 16 mm. There is Shelia Shelia soft tissue mass within the distal common bile duct measuring 3.1 x 1.6 x 1.2 cm. This is consistent with obstructing tumefactive sludge or Shelia tumor. MRCP should be considered for further evaluation. Associated intrahepatic biliary ductal distention noted. These results will be called to the ordering clinician or representative by the Radiologist Assistant, and communication documented in the PACS or zVision Dashboard.   Electronically Signed   By: Marcello Moores  Register   On: 05/15/2015 17:13   Mr 3d Recon At Scanner  05/16/2015   COMPARISON:  Ultrasound on 05/15/2015  FINDINGS: Lower chest: No acute findings.  Hepatobiliary: No liver masses identified. Diffuse intra and extrahepatic biliary ductal dilatation is seen to the level of the pancreatic head. Common bile duct measures 20 mm in diameter. Abrupt stricture of the distal common bile duct is seen.  Gallbladder is distended and contains small gallstones, however there is no definite evidence of acute cholecystitis.  Pancreas: An irregular hypovascular mass is seen in the pancreatic head measuring 3.6 x 2.5 cm on image 38/series 2001. Pancreatic ductal dilatation  is seen within the body and tail.  Spleen:  Within normal limits in size and appearance.  Adrenal Glands:  No masses identified.  Kidneys: No renal masses identified. No evidence of hydronephrosis.  Stomach/Bowel/Peritoneum: No evidence of wall thickening, mass, or obstruction involving visualized abdominal bowel.  Vascular/Lymphatic: No pathologically enlarged lymph nodes identified. No abdominal aortic aneurysm or other significant retroperitoneal abnormality demonstrated.  Other:  None.  Musculoskeletal:  No suspicious bone lesions identified.  IMPRESSION: Hypovascular mass in the pancreatic head measuring 3.6 cm, highly suspicious for pancreatic adenocarcinoma. This obstructs the distal common bile duct causing severe diffuse biliary ductal dilatation.  No definite evidence of metastatic disease within the abdomen.  Cholelithiasis, without definite signs of acute cholecystitis.   Electronically Signed   By: Earle Gell M.D.   On: 05/16/2015 09:47   Dg Ercp Biliary & Pancreatic Ducts   COMPARISON:  05/16/2015  FINDINGS: Initial images demonstrate Shelia wire in the common bile duct with contrast material present. Significant ductal dilatation is noted similar to that seen on prior MRI examination. Shelia metallic stent was then placed across the obstructive lesion. Focal narrowing is noted distally in the area of obstruction. The stent appears appropriately placed.  IMPRESSION: Successful metallic stenting of Shelia distal common bile duct obstruction.  These images were submitted for radiologic interpretation only. Please see the procedural report for the amount of contrast and the fluoroscopy time utilized.   Electronically Signed   By: Inez Catalina M.D.   On: 05/19/2015 14:57   Mr Jeananne Rama W/wo Cm/mrcp  05/16/2015   COMPARISON:  Ultrasound on 05/15/2015  FINDINGS: Lower chest: No acute findings.  Hepatobiliary: No liver masses identified.  Diffuse intra and extrahepatic biliary ductal dilatation is seen to the level of the  pancreatic head. Common bile duct measures 20 mm in diameter. Abrupt stricture of the distal common bile duct is seen.  Gallbladder is distended and contains small gallstones, however there is no definite evidence of acute cholecystitis.  Pancreas: An irregular hypovascular mass is seen in the pancreatic head measuring 3.6 x 2.5 cm on image 38/series 2001. Pancreatic ductal dilatation is seen within the body and tail.  Spleen:  Within normal limits in size and appearance.  Adrenal Glands:  No masses identified.  Kidneys: No renal masses identified. No evidence of hydronephrosis.  Stomach/Bowel/Peritoneum: No evidence of wall thickening, mass, or obstruction involving visualized abdominal bowel.  Vascular/Lymphatic: No pathologically enlarged lymph nodes identified. No abdominal aortic aneurysm or other significant retroperitoneal abnormality demonstrated.  Other:  None.  Musculoskeletal:  No suspicious bone lesions identified.  IMPRESSION: Hypovascular mass in the pancreatic head measuring 3.6 cm, highly suspicious for pancreatic adenocarcinoma. This obstructs the distal common bile duct causing severe diffuse biliary ductal dilatation.  No definite evidence of metastatic disease within the abdomen.  Cholelithiasis, without definite signs of acute cholecystitis.   Electronically Signed   By: Earle Gell M.D.   On: 05/16/2015 09:47      Assessment/Plan: 49 y.o. AAF   Pancreatic Adenocarcinoma, invading duodenum  In the setting of pancreatic mass Duodenum biopsy showed adenocarcinoma, giving the CT findings, this is c/w pancreatic primary  COnsider CT chest to complete staging, rule out metastatic disease CA19.9 is normal  In no metastatic disease, will consider surgery evaluation An appointment at the [facility] to be arranged for follow up and treatment options.  Malnutrition  COnsider Nutrition evaluation  Hyperbilirubinemia  Secondary to biliary obstruction due to #1 and seizure  medications S/p biliary stent placement with improvement of liver functions levels.  Pruritus Due to malignancy, jaundice Improving after ERCP   Anemia in neoplastic disease No bleeding issues are reported at this time Monitor counts closely Transfuse blood to maintain Shelia Hb of 8 g or if the patient is acutely bleeding  Thrombocytosis In the setting of anemia and malignancy, likely reactive Please check ferritin and serum iron, TIBC  This is trending down No intervention is indicated at this time, will continue to monitor.  Leukocytosis Likely reactive in the setting of pain and inflammation This is normalizing No intervention is indicated at this time Will continue to monitor  History of tobacco and ETOH Substance cessation program is recommended  DVT prophylaxis On SCDs  Full Code   Other medical issues including history of seizures as per admitting team    [facility] E, PA-C 05/20/2015 11:55 AM  Attending addendum I have seen the patient, examined her. I agree with the assessment and and plan and have edited the notes.   49 yo AAF with locally advanced pancreatic head adenocarcinoma, presented with obstructive jaundice, s/p CBD stent placement by Dr. Amedeo Plenty.   Please obtain CT chest for staging. If not distant metastases, her tumor could be potentially resectable. I will referral her to surgeon Dr. Barry Dienes. She will likely need adjuvant chemo. I will arrange her follow up with me.   Thanks for the opportunity to participate her care.  Truitt Merle  05/20/2015

## 2015-05-20 NOTE — Progress Notes (Signed)
Patient ID: Shelia Arnold, female   DOB: [birth year], 49 y.o.   MRN: [id number]   Subjective: Shelia Arnold said she's been having some right-sided flank and back pain since her ERCP yesterday. No nausea of vomiting. When we mentioned the idea that she could go home after the cancer doctor sees her today, she was very adamant that she would wait until tomorrow because this is what she had discussed with Dr. Amedeo Plenty earlier, saying "I want everything to be done while I'm here." We asked her if she was afraid of going home and she said she wasn't. She was nervous about getting to her oncology appointment because she didn't want to stand in the hot sun waiting for a bus. I told her I would speak to social work about getting her a ride and she felt a little better. We agreed we could watch her overnight after oncology sees her and plan to go home tomorrow, ensuring she has an appointment with the oncologist and a ride to her appointment.  Objective: Vital signs in last 24 hours: Filed Vitals:   05/19/15 1440 05/19/15 1442 05/19/15 2110 05/20/15 0557  BP: 95/43 95/43 134/64 149/74  Pulse: 88 89 71 67  Temp:   99.6 F (37.6 C)   TempSrc:   Oral   Resp: 11 15 20 20   Height:      Weight:      SpO2: 100% 100% 100% 100%   General: resting in bed trying to sleep HEENT: less scleral icterus Cardiac: RRR, no rubs, murmurs or gallops Pulm: clear to auscultation bilaterally, moving normal volumes of air Abd: a little tender on her right flank and back. No abdominal tenderness Ext: less jaundiced today  Lab Results: Basic Metabolic Panel:  Recent Labs Lab 05/15/15 1818  05/18/15 0515 05/20/15 0034  NA 124*  < > 128* 128*  K 2.1*  < > 3.0* 3.4*  CL 78*  < > 94* 96*  CO2 28  < > 24 23  GLUCOSE 90  < > 114* 139*  BUN 10  < > 7 6  CREATININE 0.60  < > 0.54 0.57  CALCIUM 9.5  < > 8.8* 8.9  MG 2.9*  --   --   --   < > = values in this interval not displayed.   Liver Function Tests:  Recent  Labs Lab 05/18/15 0515 05/20/15 0034  AST 144* 140*  ALT 123* 132*  ALKPHOS 423* 441*  BILITOT 18.0* 13.2*  PROT 6.0* 5.8*  ALBUMIN 1.9* 1.9*   Studies/Results: Dg Ercp Biliary & Pancreatic Ducts  05/19/2015   CLINICAL DATA:  Pancreatic head mass with biliary ductal obstruction  EXAM: ERCP  TECHNIQUE: Multiple spot images obtained with the fluoroscopic device and submitted for interpretation post-procedure.  FLUOROSCOPY TIME:  Radiation Exposure Index (as provided by the fluoroscopic device): Not available  If the device does not provide the exposure index:  Fluoroscopy Time:  4 minutes 59 seconds  Number of Acquired Images:  3  COMPARISON:  05/16/2015  FINDINGS: Initial images demonstrate a wire in the common bile duct with contrast material present. Significant ductal dilatation is noted similar to that seen on prior MRI examination. A metallic stent was then placed across the obstructive lesion. Focal narrowing is noted distally in the area of obstruction. The stent appears appropriately placed.  IMPRESSION: Successful metallic stenting of a distal common bile duct obstruction.  These images were submitted for radiologic interpretation only. Please see the procedural report  for the amount of contrast and the fluoroscopy time utilized.   Electronically Signed   By: Inez Catalina M.D.   On: 05/19/2015 14:57   Medications: I have reviewed the patient's current medications. Scheduled Meds: . amitriptyline  25 mg Oral QHS  . folic acid  1 mg Oral Daily  . levETIRAcetam  1,500 mg Oral BID  . multivitamin with minerals  1 tablet Oral Daily  . sodium chloride  3 mL Intravenous Q12H  . sodium chloride  3 mL Intravenous Q12H  . thiamine  100 mg Oral Daily   Continuous Infusions:  PRN Meds:.cetaphil   Assessment/Plan: Pancreatic mass: Dr. Amedeo Plenty performed her ERCP yesterday which showed invasion into her duodenal wall. He was able to obtain a biopsy and stent her biliary tree. Her bilirubin came  down and she's less jaundiced on exam today. She is complaining of some back pain which I suspect is muskuloskeletal, likely from lying on the surgical table during her procedure, but of course I'm also considering ERCP-induced pancreatitis. I won't get a lipase because it will be elevated from ERCP anyway and her pain isn't classic for pancreatitis. I told her to call the nurse if she starts vomiting or having severe pain. Lastly, I spoke with Dr. Burr Medico with oncology who was kind enough to see her today even though we won't have biopsy results yet. She seems to have some element of developmental delay; follow-up is crucial so it will be nice for her to meet Dr. Burr Medico so she will stay connected going forward. Social work is working on getting her a ride through [employer]. -Thank you GI and oncology for your help in caring for Shelia Arnold -Oncology to see her today -CMP tomorrow morning  Hypertension and irritability: She was more irritable today and her blood pressure increased overnight from 100/80s to 140s/90s; I'm concerned about alcohol withdrawal, especially given her history of seizures. She was drinking about 4 beers a day before coming into the hospital. I re-started CIWA scores. -CIWA scores q12hr -Seizure precautions  Dispo: Likely discharge tomorrow  The patient does have a current PCP Shelia Salina, MD) and does need an [facility] hospital follow-up appointment after discharge.  The patient does have transportation limitations that hinder transportation to clinic appointments.  .Services Needed at time of discharge: Y = Yes, Blank = No PT:   OT:   RN:   Equipment:   Other:     LOS: 5 days   Loleta Chance, MD 05/20/2015, 10:14 AM

## 2015-05-20 NOTE — Progress Notes (Signed)
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
## 2015-05-21 ENCOUNTER — Inpatient Hospital Stay (HOSPITAL_COMMUNITY): Payer: Medicaid Other

## 2015-05-21 DIAGNOSIS — Z9889 Other specified postprocedural states: Secondary | ICD-10-CM

## 2015-05-21 DIAGNOSIS — M545 Low back pain: Secondary | ICD-10-CM

## 2015-05-21 DIAGNOSIS — R1031 Right lower quadrant pain: Secondary | ICD-10-CM

## 2015-05-21 LAB — COMPREHENSIVE METABOLIC PANEL
ALT: 127 U/L — ABNORMAL HIGH (ref 14–54)
ANION GAP: 11 (ref 5–15)
AST: 111 U/L — ABNORMAL HIGH (ref 15–41)
Albumin: 2.2 g/dL — ABNORMAL LOW (ref 3.5–5.0)
Alkaline Phosphatase: 427 U/L — ABNORMAL HIGH (ref 38–126)
BILIRUBIN TOTAL: 10.6 mg/dL — AB (ref 0.3–1.2)
BUN: 7 mg/dL (ref 6–20)
CALCIUM: 9.5 mg/dL (ref 8.9–10.3)
CO2: 20 mmol/L — AB (ref 22–32)
Chloride: 99 mmol/L — ABNORMAL LOW (ref 101–111)
Creatinine, Ser: 0.6 mg/dL (ref 0.44–1.00)
GFR calc non Af Amer: >60 mL/min (ref >60)
Glucose, Bld: 124 mg/dL — ABNORMAL HIGH (ref 65–99)
Potassium: 3.9 mmol/L (ref 3.5–5.1)
SODIUM: 130 mmol/L — AB (ref 135–145)
TOTAL PROTEIN: 6.6 g/dL (ref 6.5–8.1)

## 2015-05-21 LAB — AFP TUMOR MARKER: AFP TUMOR MARKER: 2.6 ng/mL (ref 0.0–8.3)

## 2015-05-21 MED ORDER — DOCUSATE SODIUM 100 MG PO CAPS
100.0000 mg | ORAL_CAPSULE | Freq: Two times a day (BID) | ORAL | Status: DC
  Administered 2015-05-21: 100 mg via ORAL
  Filled 2015-05-21: qty 1

## 2015-05-21 MED ORDER — IBUPROFEN 400 MG PO TABS
800.0000 mg | ORAL_TABLET | Freq: Once | ORAL | Status: AC
  Administered 2015-05-21: 800 mg via ORAL
  Filled 2015-05-21: qty 2

## 2015-05-21 MED ORDER — ADULT MULTIVITAMIN W/MINERALS CH: 1.0000 | ORAL_TABLET | Freq: Every day | ORAL | Status: AC

## 2015-05-21 MED ORDER — TRAMADOL HCL 50 MG PO TABS
50.0000 mg | ORAL_TABLET | Freq: Four times a day (QID) | ORAL | Status: DC | PRN
  Administered 2015-05-21 – 2015-05-22 (×3): 50 mg via ORAL
  Filled 2015-05-21 (×3): qty 1

## 2015-05-21 MED ORDER — PANTOPRAZOLE SODIUM 40 MG PO TBEC
40.0000 mg | DELAYED_RELEASE_TABLET | Freq: Every day | ORAL | Status: DC
  Administered 2015-05-21: 40 mg via ORAL
  Filled 2015-05-21: qty 1

## 2015-05-21 MED ORDER — IBUPROFEN 400 MG PO TABS
800.0000 mg | ORAL_TABLET | Freq: Four times a day (QID) | ORAL | Status: DC | PRN
  Filled 2015-05-21: qty 2

## 2015-05-21 MED ORDER — IBUPROFEN 800 MG PO TABS
800.0000 mg | ORAL_TABLET | Freq: Four times a day (QID) | ORAL | Status: DC | PRN
Start: 2015-05-21 — End: 2015-05-26

## 2015-05-21 NOTE — Progress Notes (Signed)
CSW spoke with pt concerning transportation to medical appointments.  Pt has Medicaid and would be eligible for [employer]- CSW provided pt with transportation resources and explained how to contact [place] services to set up transportation.  CSW encouraged pt to call the Medicaid transport number today in order to set up transport for upcoming appointments- pt expressed understanding.  CSW signing off.  Domenica Reamer, [place] Social Worker [phone]

## 2015-05-21 NOTE — Progress Notes (Signed)
Patient ID: Shelia Arnold, female   DOB: 01-27-1966, 49 y.o.   MRN: [id number]   Subjective: Shelia Arnold said her right flank and back continue to hurt; this pain started after she got her ERCP 2 days ago and hasn't gotten any better. Her back is tender to light palpation and it hurts to roll on her side. I explained this is probably from lying on the hard surgical bed. She adamantly said she wasn't going home until her back felt like normal. I told her can try ibuprofen and I'll check on her later this afternoon. She still has a good appetite, without nausea or vomiting, and no abdominal pain.  Objective: Vital signs in last 24 hours: Filed Vitals:   05/20/15 0557 05/20/15 1314 05/20/15 2205 05/21/15 0550  BP: 149/74 135/79 138/67 123/71  Pulse: 67 76 74 77  Temp:  98.3 F (36.8 C) 99 F (37.2 C) 99.1 F (37.3 C)  TempSrc:  Oral Oral Oral  Resp: 20 16 20 18   Height:      Weight:      SpO2: 100% 100% 100% 100%   General: resting in bed trying to sleep HEENT: less scleral icterus Cardiac: RRR, no rubs, murmurs or gallops Pulm: clear to auscultation bilaterally, moving normal volumes of air Abd: a little tender on her right flank and back. No abdominal tenderness Ext: less jaundiced today  Lab Results: Basic Metabolic Panel:  Recent Labs Lab 05/15/15 1818  05/20/15 0034 05/21/15 0649  NA 124*  < > 128* 130*  K 2.1*  < > 3.4* 3.9  CL 78*  < > 96* 99*  CO2 28  < > 23 20*  GLUCOSE 90  < > 139* 124*  BUN 10  < > 6 7  CREATININE 0.60  < > 0.57 0.60  CALCIUM 9.5  < > 8.9 9.5  MG 2.9*  --   --   --   < > = values in this interval not displayed.   Liver Function Tests:  Recent Labs Lab 05/20/15 0034 05/21/15 0649  AST 140* 111*  ALT 132* 127*  ALKPHOS 441* 427*  BILITOT 13.2* 10.6*  PROT 5.8* 6.6  ALBUMIN 1.9* 2.2*   Studies/Results: Ct Chest W Contrast  05/20/2015   CLINICAL DATA:  Pancreatic cancer.  3-4 week history of jaundice.  EXAM: CT CHEST WITH  CONTRAST  TECHNIQUE: Multidetector CT imaging of the chest was performed during intravenous contrast administration.  CONTRAST:  162mL OMNIPAQUE IOHEXOL 300 MG/ML  SOLN  COMPARISON:  None.  FINDINGS: Mediastinum/Nodes: No mediastinal, hilar, or axillary adenopathy. Thoracic aorta is normal in caliber. The heart size is normal. No pericardial effusion.  Lungs/Pleura: No pulmonary nodule. Scattered emphysematous change primarily in the upper lobes. Trace linear atelectasis in the right lower lobe. No consolidation or pulmonary edema. No pleural effusion.  Upper abdomen: Known pancreatic head mass not included in the field of view, there is pancreatic ductal dilatation. Stomach distended with ingested contents. Biliary stent, partially included. Calcified gallstones within the gallbladder. No focal hepatic lesion in the included liver.  Musculoskeletal: There are no acute or suspicious osseous abnormalities. No lytic or sclerotic lesions.  IMPRESSION: 1. No evidence of metastatic disease in the thorax. 2. Mild emphysema.   Electronically Signed   By: Jeb Levering M.D.   On: 05/20/2015 23:34   Dg Ercp Biliary & Pancreatic Ducts  05/19/2015   CLINICAL DATA:  Pancreatic head mass with biliary ductal obstruction  EXAM: ERCP  TECHNIQUE:  Multiple spot images obtained with the fluoroscopic device and submitted for interpretation post-procedure.  FLUOROSCOPY TIME:  Radiation Exposure Index (as provided by the fluoroscopic device): Not available  If the device does not provide the exposure index:  Fluoroscopy Time:  4 minutes 59 seconds  Number of Acquired Images:  3  COMPARISON:  05/16/2015  FINDINGS: Initial images demonstrate a wire in the common bile duct with contrast material present. Significant ductal dilatation is noted similar to that seen on prior MRI examination. A metallic stent was then placed across the obstructive lesion. Focal narrowing is noted distally in the area of obstruction. The stent appears  appropriately placed.  IMPRESSION: Successful metallic stenting of a distal common bile duct obstruction.  These images were submitted for radiologic interpretation only. Please see the procedural report for the amount of contrast and the fluoroscopy time utilized.   Electronically Signed   By: Inez Catalina M.D.   On: 05/19/2015 14:57   Medications: I have reviewed the patient's current medications. Scheduled Meds: . amitriptyline  25 mg Oral QHS  . folic acid  1 mg Oral Daily  . ibuprofen  800 mg Oral Once  . levETIRAcetam  1,500 mg Oral BID  . multivitamin with minerals  1 tablet Oral Daily  . sodium chloride  3 mL Intravenous Q12H  . sodium chloride  3 mL Intravenous Q12H  . 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 Oncology saw her and agreed to see her as an oupatient; she will also see gastroenterology as an oupatient as well. We obtained a chest CT that was negative for metastatic disease, and her AFP and LDH were normal. Her bilirubin continues to come down and she's less jaundiced on exam today. -Thank you GI and oncology for your help in caring for Shelia Arnold -She has outpatient appointments with oncology and GI -Social work will see her today to arrange her rides to her appointments   Right flank and back pain: She continues to complain of back and flank pain which I suspect is muskuloskeletal, likely from lying on the surgical table during her procedure. I gave her ibuprofen 800mg  and I'll check on her later today. If she's feeling better, she can go home today. If she's still in pain, we can try percocet and see how she feels this afternoon. I don't think she has ERCP-induced pancreatitis because the pain is reproducible, she has no nausea nor vomiting, she still has a good appetite, and she doesn't have any abdominal pain. I  won't get a lipase because it will be elevated from ERCP anyway and her pain isn't classic for pancreatitis. -Ibuprofen 800mg  this morning -I'll check on her this afternoon around 2; if she's better, she can go home  Dispo: Hopefully today if her back pain is improved.  The patient does have a current PCP [name], MD) and does need an [facility] hospital follow-up appointment after discharge.  The patient does have transportation limitations that hinder transportation to clinic appointments because of her seizures.  .Services Needed at time of discharge: Y = Yes, Blank = No PT:   OT:   RN:   Equipment:   Other:     LOS: 6 days   Loleta Chance, MD 05/21/2015, 12:42 PM

## 2015-05-21 NOTE — Plan of Care (Signed)
Pt complaining of acid reflux and gas pain. RN paged dr to request PRN.

## 2015-05-22 ENCOUNTER — Telehealth: Payer: Self-pay | Admitting: Hematology

## 2015-05-22 DIAGNOSIS — C259 Malignant neoplasm of pancreas, unspecified: Principal | ICD-10-CM

## 2015-05-22 DIAGNOSIS — G40909 Epilepsy, unspecified, not intractable, without status epilepticus: Secondary | ICD-10-CM

## 2015-05-22 LAB — COMPREHENSIVE METABOLIC PANEL
ALBUMIN: 2.2 g/dL — AB (ref 3.5–5.0)
ALK PHOS: 364 U/L — AB (ref 38–126)
ALT: 140 U/L — ABNORMAL HIGH (ref 14–54)
AST: 134 U/L — AB (ref 15–41)
Anion gap: 10 (ref 5–15)
BILIRUBIN TOTAL: 8.7 mg/dL — AB (ref 0.3–1.2)
BUN: 5 mg/dL — AB (ref 6–20)
CALCIUM: 9.1 mg/dL (ref 8.9–10.3)
CO2: 23 mmol/L (ref 22–32)
Chloride: 95 mmol/L — ABNORMAL LOW (ref 101–111)
Creatinine, Ser: 0.58 mg/dL (ref 0.44–1.00)
GFR calc Af Amer: >60 mL/min (ref >60)
GFR calc non Af Amer: >60 mL/min (ref >60)
GLUCOSE: 195 mg/dL — AB (ref 65–99)
Potassium: 3.6 mmol/L (ref 3.5–5.1)
Sodium: 128 mmol/L — ABNORMAL LOW (ref 135–145)
TOTAL PROTEIN: 6.9 g/dL (ref 6.5–8.1)

## 2015-05-22 MED ORDER — SENNA 8.6 MG PO TABS
2.0000 | ORAL_TABLET | Freq: Once | ORAL | Status: AC
  Administered 2015-05-22: 17.2 mg via ORAL
  Filled 2015-05-22: qty 2

## 2015-05-22 MED ORDER — TRAMADOL HCL 50 MG PO TABS: 50.0000 mg | ORAL_TABLET | Freq: Four times a day (QID) | ORAL | Status: AC | PRN

## 2015-05-22 MED ORDER — SENNOSIDES-DOCUSATE SODIUM 8.6-50 MG PO TABS
1.0000 | ORAL_TABLET | Freq: Once | ORAL | Status: DC
  Administered 2015-05-22: 1 via ORAL
  Filled 2015-05-22: qty 1

## 2015-05-22 MED ORDER — POLYETHYLENE GLYCOL 3350 17 G PO PACK
17.0000 g | PACK | Freq: Once | ORAL | Status: AC
  Administered 2015-05-22: 17 g via ORAL
  Filled 2015-05-22: qty 1

## 2015-05-22 NOTE — Progress Notes (Addendum)
NURSING PROGRESS NOTE  Shelia Arnold [id number] Discharge Data: 05/22/2015 5:41 PM Attending Provider: No att. providers found MOL:[facility] Cassie Freer, MD   Tonye Pearson Needs to be D/C'd Home per MD order, taken out via wheelchair by [facility] with hard copies of all prescriptions.    All IV's will be discontinued and monitored for bleeding.  All belongings will be returned to patient for patient to take home.  Last Documented Vital Signs:  Blood pressure 120/77, pulse 95, temperature 98 F (36.7 C), temperature source Oral, resp. rate 19, height 5' (1.524 m), weight 52 kg (114 lb 10.2 oz), last menstrual period 12/26/2014, SpO2 100 %.  Hendricks Limes RN, BS, BSN

## 2015-05-22 NOTE — Discharge Instructions (Addendum)
Shelia Arnold,  It was a pleasure taking care of you but I'm sorry they were under such circumstances.  We know you have pancreatic cancer and now the cancer doctors will help figure out whether it was only in your pancreas or whether it spread. They may be able to do surgery to remove the cancer. It is very important to see your cancer doctor, Dr. Burr Medico, on September 6th at 10:45am. I believe you met her while you were in the hospital. She is an excellent doctor and will be able to help you going forward.  Take care, Dr. Melburn Hake

## 2015-05-22 NOTE — Telephone Encounter (Signed)
Hosp f/u appt-s/w patient and gave hosp f/u appt for 09/06 @ 10:45 w/Dr. Burr Medico.

## 2015-05-22 NOTE — Progress Notes (Signed)
Patient ID: Shelia Arnold, female   DOB: 1966-06-29, 49 y.o.   MRN: [id number]   Subjective: Shelia Arnold said her back pain is feeling a lot better after getting some tramadol overnight. She still feels constipated and hasn't had a bowel movement in 3 days. She said she's ready to go home once she's able to have a bowel movement. She got 2 tablets of Senikot and she was hoping that would help her go. Once she leaves, she said she knows how to get rides to her appointments and marked both of them in her calendar.  Objective: Vital signs in last 24 hours: Filed Vitals:   05/21/15 0550 05/21/15 1544 05/21/15 2200 05/22/15 0519  BP: 123/71 131/61 143/84 122/65  Pulse: 77 90 93 90  Temp: 99.1 F (37.3 C) 99.2 F (37.3 C) 98.5 F (36.9 C) 98.6 F (37 C)  TempSrc: Oral Oral Oral Oral  Resp: 18 18 16 14   Height:      Weight:      SpO2: 100% 100% 100% 100%   General: resting in bed trying to sleep HEENT: scleral icterus dramatically improved since admission Cardiac: RRR, no rubs, murmurs or gallops Pulm: clear to auscultation bilaterally, moving normal volumes of air Abd: soft, nontender, nondistended, BS present Ext: no lesions or bruises on her back. Less tender than yesterday  Lab Results:Studies/Results: Dg Abd Portable 1v  05/21/2015   CLINICAL DATA:  Right flank pain and constipation for 2 days, following ERCP and CBD stent placement. Recently diagnosed pancreatic carcinoma.  EXAM: PORTABLE ABDOMEN - 1 VIEW  COMPARISON:  05/19/2015 ERCP  FINDINGS: A CBD stent is identified.  The bowel gas pattern is unremarkable except for mild-moderate right colonic stool.  No dilated bowel loops are per identified.  There appears to be gas within the gallbladder with several small stones.  IMPRESSION: Mild -moderate right colonic stool. Unremarkable bowel gas pattern otherwise.  Question gas in the gallbladder which can be seen following biliary procedure.   Electronically Signed   By: Margarette Canada M.D.   On: 05/21/2015 15:07   Medications: I have reviewed the patient's current medications. Scheduled Meds: . amitriptyline  25 mg Oral QHS  . folic acid  1 mg Oral Daily  . levETIRAcetam  1,500 mg Oral BID  . multivitamin with minerals  1 tablet Oral Daily  . senna-docusate  1 tablet Oral Once  . sodium chloride  3 mL Intravenous Q12H  . sodium chloride  3 mL Intravenous Q12H  . thiamine  100 mg Oral Daily   Continuous Infusions:  PRN Meds:.cetaphil, ibuprofen, traMADol   Assessment/Plan: Pancreatic adenocarcinoma: She is set-up for both oncology and gastroenterology appointments and knows how to get a ride through [facility], thanks to Social Work's help. -Thank you Social Work, GI and oncology for your help in caring for Shelia Arnold  Right flank and back pain: Her back pain is improved after getting tramadol last night. I think this was from lying on the hard bed but she's also constipated which isn't helping. An abdominal x-ray showed moderate stool burden and gas in the gallbladder which is normal following ERCP; no signs for ileus or obstruction. She said she'll be ready to go home once she has a bowel movement. I don't want to rush her. Follow-up is imperative because she may be a candidate for [facility] so I want to maintain her trust with medical providers. -Continue tramadol 50mg  -Another 2 tablets of docusate/senna this morning  Dispo: Disposition  is deferred at this time, probably this afternoon after she has a bowel movement  The patient does have a current PCP [name], MD) and does need an [facility] hospital follow-up appointment after discharge.  The patient does have transportation limitations that hinder transportation to clinic appointments.  .Services Needed at time of discharge: Y = Yes, Blank = No PT:   OT:   RN:   Equipment:   Other:     LOS: 7 days   Loleta Chance, MD 05/22/2015, 8:55 AM

## 2015-05-25 ENCOUNTER — Encounter: Payer: Self-pay | Admitting: *Deleted

## 2015-05-25 ENCOUNTER — Telehealth: Payer: Self-pay | Admitting: Internal Medicine

## 2015-05-25 ENCOUNTER — Telehealth: Payer: Self-pay | Admitting: Hematology

## 2015-05-25 NOTE — Telephone Encounter (Signed)
Hosp f/u-s/w patient and gave new d/t for appt for 09/02 @ 9:15 lab, 9:45 [place], [address]

## 2015-05-25 NOTE — Telephone Encounter (Signed)
Pt stated she can not take the ibuprofen 800 mg, pt want the doctor to lower the dosage.

## 2015-05-25 NOTE — Progress Notes (Signed)
[place] [facility]  Clinical Social Work was referred by patient navigator for assessment of psychosocial needs.  Clinical Social Worker contacted patient at home to offer support and assess for needs.  Patient had expressed transportation concerns to RN.  CSW offered patient support regarding transportation.  Patient stated she was given information on how to scheduled rides through [facility] transportation while in the hospital.  Previous documentation supports that patient was seen by CSW while in the hospital and provided transportation resources.  Patient stated she felt comfortable calling and scheduling transportation.  Patient plans to call tomorrow as they need 3 days prior to schedule.  Patient plans to call CSW if she has any difficulty.       Johnnye Lana, MSW, LCSW, OSW-C Clinical Social Worker [facility] [phone]

## 2015-05-26 ENCOUNTER — Ambulatory Visit: Payer: Medicaid Other | Admitting: Internal Medicine

## 2015-05-26 MED ORDER — IBUPROFEN 400 MG PO TABS: 400.0000 mg | ORAL_TABLET | Freq: Four times a day (QID) | ORAL | Status: AC | PRN

## 2015-05-26 NOTE — Telephone Encounter (Signed)
Ibuprofen treatment for low back pain syndrome. Dose changed to 400mg  with increased pill count. Prescription sent to patient pharmacy, previous had no refills to discontinue.

## 2015-05-27 ENCOUNTER — Ambulatory Visit: Payer: Medicaid Other | Admitting: Internal Medicine

## 2015-05-27 ENCOUNTER — Telehealth: Payer: Self-pay | Admitting: Pharmacist

## 2015-05-27 DIAGNOSIS — G40909 Epilepsy, unspecified, not intractable, without status epilepticus: Secondary | ICD-10-CM

## 2015-05-27 MED ORDER — LEVETIRACETAM 1000 MG PO TABS: ORAL_TABLET | ORAL | Status: DC

## 2015-05-27 NOTE — Telephone Encounter (Signed)
Worked with Dr. Naaman Plummer for levetiracetam dose adjustment. Patient also has an upcoming clinic appointment on 06/03/15. Will continue to collaborate with the team in the care of this patient.

## 2015-05-28 ENCOUNTER — Other Ambulatory Visit: Payer: Self-pay | Admitting: *Deleted

## 2015-05-28 ENCOUNTER — Other Ambulatory Visit: Payer: Self-pay | Admitting: Internal Medicine

## 2015-05-28 DIAGNOSIS — C259 Malignant neoplasm of pancreas, unspecified: Secondary | ICD-10-CM

## 2015-05-29 ENCOUNTER — Encounter: Payer: Self-pay | Admitting: *Deleted

## 2015-05-29 ENCOUNTER — Encounter: Payer: Self-pay | Admitting: Hematology

## 2015-05-29 ENCOUNTER — Ambulatory Visit (HOSPITAL_BASED_OUTPATIENT_CLINIC_OR_DEPARTMENT_OTHER): Payer: Medicaid Other

## 2015-05-29 ENCOUNTER — Other Ambulatory Visit: Payer: Self-pay | Admitting: General Surgery

## 2015-05-29 ENCOUNTER — Ambulatory Visit: Payer: Medicaid Other | Admitting: Nutrition

## 2015-05-29 ENCOUNTER — Other Ambulatory Visit (HOSPITAL_BASED_OUTPATIENT_CLINIC_OR_DEPARTMENT_OTHER): Payer: Medicaid Other

## 2015-05-29 ENCOUNTER — Telehealth: Payer: Self-pay | Admitting: Hematology

## 2015-05-29 ENCOUNTER — Ambulatory Visit: Payer: Medicaid Other | Attending: Hematology | Admitting: Physical Therapy

## 2015-05-29 ENCOUNTER — Ambulatory Visit (HOSPITAL_BASED_OUTPATIENT_CLINIC_OR_DEPARTMENT_OTHER): Payer: Medicaid Other | Admitting: Hematology

## 2015-05-29 VITALS — BP 113/74 | HR 74 | Temp 98.6°F | Resp 18 | Ht 60.0 in | Wt 103.2 lb

## 2015-05-29 DIAGNOSIS — C250 Malignant neoplasm of head of pancreas: Secondary | ICD-10-CM | POA: Diagnosis not present

## 2015-05-29 DIAGNOSIS — C259 Malignant neoplasm of pancreas, unspecified: Secondary | ICD-10-CM | POA: Insufficient documentation

## 2015-05-29 DIAGNOSIS — D649 Anemia, unspecified: Secondary | ICD-10-CM

## 2015-05-29 DIAGNOSIS — R5381 Other malaise: Secondary | ICD-10-CM | POA: Diagnosis not present

## 2015-05-29 LAB — COMPREHENSIVE METABOLIC PANEL (CC13)
ALBUMIN: 3 g/dL — AB (ref 3.5–5.0)
ALK PHOS: 275 U/L — AB (ref 40–150)
ALT: 235 U/L — AB (ref 0–55)
ANION GAP: 12 meq/L — AB (ref 3–11)
AST: 141 U/L — AB (ref 5–34)
BUN: 14 mg/dL (ref 7.0–26.0)
CALCIUM: 10 mg/dL (ref 8.4–10.4)
CO2: 27 mEq/L (ref 22–29)
CREATININE: 0.7 mg/dL (ref 0.6–1.1)
Chloride: 93 mEq/L — ABNORMAL LOW (ref 98–109)
EGFR: >90 mL/min/{1.73_m2} (ref >90)
Glucose: 84 mg/dl (ref 70–140)
Potassium: 2.7 mEq/L — CL (ref 3.5–5.1)
Sodium: 132 mEq/L — ABNORMAL LOW (ref 136–145)
Total Bilirubin: 7.18 mg/dL (ref 0.20–1.20)
Total Protein: 7.5 g/dL (ref 6.4–8.3)

## 2015-05-29 LAB — CBC WITH DIFFERENTIAL/PLATELET
BASO%: 0.7 % (ref 0.0–2.0)
BASOS ABS: 0.1 10*3/uL (ref 0.0–0.1)
EOS%: 0.8 % (ref 0.0–7.0)
Eosinophils Absolute: 0.1 10*3/uL (ref 0.0–0.5)
HEMATOCRIT: 30 % — AB (ref 34.8–46.6)
HEMOGLOBIN: 10 g/dL — AB (ref 11.6–15.9)
LYMPH#: 3.1 10*3/uL (ref 0.9–3.3)
LYMPH%: 29.2 % (ref 14.0–49.7)
MCH: 32.6 pg (ref 25.1–34.0)
MCHC: 33.3 g/dL (ref 31.5–36.0)
MCV: 97.7 fL (ref 79.5–101.0)
MONO#: 0.7 10*3/uL (ref 0.1–0.9)
MONO%: 6.1 % (ref 0.0–14.0)
NEUT#: 6.7 10*3/uL — ABNORMAL HIGH (ref 1.5–6.5)
NEUT%: 63.2 % (ref 38.4–76.8)
PLATELETS: 785 10*3/uL — AB (ref 145–400)
RBC: 3.07 10*6/uL — ABNORMAL LOW (ref 3.70–5.45)
RDW: 14.8 % — AB (ref 11.2–14.5)
WBC: 10.6 10*3/uL — ABNORMAL HIGH (ref 3.9–10.3)

## 2015-05-29 LAB — IRON AND TIBC CHCC
%SAT: 9 % — ABNORMAL LOW (ref 21–57)
IRON: 34 ug/dL — AB (ref 41–142)
TIBC: 398 ug/dL (ref 236–444)
UIBC: 364 ug/dL (ref 120–384)

## 2015-05-29 LAB — FERRITIN CHCC: Ferritin: 669 ng/ml — ABNORMAL HIGH (ref 9–269)

## 2015-05-29 MED ORDER — POTASSIUM CHLORIDE CRYS ER 20 MEQ PO TBCR
40.0000 meq | EXTENDED_RELEASE_TABLET | Freq: Once | ORAL | Status: AC
Start: 2015-05-29 — End: 2015-05-29
  Administered 2015-05-29: 40 meq via ORAL
  Filled 2015-05-29: qty 2

## 2015-05-29 MED ORDER — POTASSIUM CHLORIDE CRYS ER 20 MEQ PO TBCR: 20.0000 meq | EXTENDED_RELEASE_TABLET | Freq: Two times a day (BID) | ORAL | Status: AC

## 2015-05-29 NOTE — Progress Notes (Signed)
Oncology Nurse Navigator Documentation  Oncology Nurse Navigator Flowsheets 05/29/2015  Referral date to RadOnc/MedOnc 05/21/2015  Navigator Encounter Type Clinic/MDC  Patient Visit Type Medonc;Surgery  Treatment Phase Treatment  Barriers/Navigation Needs Transportation;Financial;Education;Family concerns  Education Actor Options;Coping with Diagnosis/ Prognosis;Preparing for Upcoming Surgery/ Treatment;Newly Diagnosed Cancer Education  Interventions Seen by nutrition and CSW;Education Method  Education Method Verbal;Written;Teach-back  Support Groups/Services GI;ACS  Time Spent with Patient 30  Met with patient during new patient visit. Seen in [facility] [place] today. Explained the role of the GI Nurse Navigator and provided New Patient Packet with information on: 1. Pancreas cancer 2. Support groups 3. Advanced Directives 4. Fall Safety Plan Answered questions, reviewed current treatment plan using TEACH back and provided emotional support. Provided copy of current treatment plan. Discussed low K+ level and need to start Ktab twice daily (script called in). Encouraged her to call with any questions or problems that we could assist with. Will follow her when surgery is done.  Merceda Elks, RN, BSN [facility] [place]

## 2015-05-29 NOTE — Therapy (Signed)
[facility] [address] [place], [place], [address] Phone: [phone]   Fax:  [phone]  Physical Therapy Evaluation  Patient Details  Name: Shelia Arnold MRN: [id number] Date of Birth: [birth year] Referring Provider:  Truitt Merle, MD  Encounter Date: 05/29/2015      PT End of Session - 05/29/15 1231    Visit Number 1   PT Start Time 1205   PT Stop Time 1218   PT Time Calculation (min) 13 min   Activity Tolerance Patient tolerated treatment well   Behavior During Therapy [facility] for tasks assessed/performed      Past Medical History  Diagnosis Date  . Seizure   . Depression   . Hypertension   . Alcohol abuse     Past Surgical History  Procedure Laterality Date  . No past surgeries    . Ercp N/A 05/19/2015    Procedure: ENDOSCOPIC RETROGRADE CHOLANGIOPANCREATOGRAPHY (ERCP);  Surgeon: Teena Irani, MD;  Location: [facility] ENDOSCOPY;  Service: Endoscopy;  Laterality: N/A;    There were no vitals filed for this visit.  Visit Diagnosis:  Physical deconditioning - Plan: PT plan of care cert/re-cert      Subjective Assessment - 05/29/15 1221    Subjective Patient is attending GI clinic for GI cancer   Patient Stated Goals learn about self care   Currently in Pain? Yes   Pain Score 10-Worst pain ever   Pain Location Abdomen   Pain Orientation Right;Left   Pain Descriptors / Indicators Constant   Pain Type Acute pain   Pain Onset 1 to 4 weeks ago   Pain Frequency Constant   Aggravating Factors  eating   Pain Relieving Factors medication    Multiple Pain Sites No            [facility] PT Assessment - 05/29/15 0001    Assessment   Medical Diagnosis Pancreatic cancer   Onset Date/Surgical Date 05/19/15   Prior Therapy None   Precautions   Precautions Other (comment)   Precaution Comments No ultrasound   Balance Screen   Has the patient fallen in the past 6 months No   Has the patient had a decrease in  activity level because of a fear of falling?  No   Is the patient reluctant to leave their home because of a fear of falling?  No   Prior Function   Level of Independence Independent   Cognition   Overall Cognitive Status Within Functional Limits for tasks assessed   Observation/Other Assessments   Focus on Therapeutic Outcomes (FOTO)  20% limitation due to deconditioning   ROM / Strength   AROM / PROM / Strength Strength;AROM   AROM   Overall AROM Comments lumbar ROM is full   Strength   Overall Strength Comments bil. hip strength hip strength 4/5                           PT Education - 05/29/15 1230    Education provided Yes   Education Details toileting technique, walking program, tips to conserve energy, ways to cough, scar massage   Person(s) Educated Patient   Methods Explanation;Demonstration;Verbal cues;Handout   Comprehension Returned demonstration;Verbalized understanding             PT Long Term Goals - 05/29/15 1236    PT LONG TERM GOAL #1   Title understand correct toileting technique to decrease strain on pelvic floor  Time 1   Period Days   Status Achieved   PT LONG TERM GOAL #2   Title understand ways to conserve energy and walking program   Time 1   Period Weeks   Status Achieved               Plan - 05/29/15 1231    Clinical Impression Statement Patient is a 49 year old female with diagnosis of Pancreatic cancer on 05/19/2015.  Patient is presently attending GI clinic.  Patient will be having surgery in the future to remove the cancer.  Patient is having difficulty with bowel movements due to constipation and pain. Patient reports her pain is constant at level 10/10 and pain medication will reduce it.  Bilateral hip strength is 4/5. Patient is not on a walking program due  not being  safe in the neighborhood. Patient would benefit from physical therapy to understand toileting technique and conservie energy and walking program.     Pt will benefit from skilled therapeutic intervention in order to improve on the following deficits Decreased activity tolerance;Pain   Rehab Potential Excellent   Clinical Impairments Affecting Rehab Potential None   PT Frequency 1x / week   PT Duration --  1 session in GI clinic   PT Treatment/Interventions ADLs/Self Care Home Management;Patient/family education   PT Next Visit Plan Discharge to HEP   PT Home Exercise Plan Current HEP   Recommended Other Services None   Consulted and Agree with Plan of Care Patient         Problem List Patient Active Problem List   Diagnosis Date Noted  . Pancreatic mass 05/20/2015  . Jaundice 05/15/2015  . High risk HPV infection 07/11/2013  . Preventative health care 07/08/2013  . Essential hypertension, benign 10/29/2012  . Depression 02/03/2011  . TOBACCO ABUSE 11/19/2009  . DEVELOPMENT DELAY NOS 05/04/2007  . UNDERWEIGHT 05/04/2007  . FIBROADENOSIS, BREAST 10/06/2006  . LOW BACK PAIN SYNDROME 10/06/2006  . Seizure disorder 10/06/2006  . PAP SMEAR, LGSIL, ABNORMAL 10/06/2006  . COLPOSCOPY, HX OF 10/06/2006    Tristyn Pharris,PT 05/29/2015, 12:39 PM  [facility] [address], [place], [place], [address] Phone: [phone]   Fax:  [phone]

## 2015-05-29 NOTE — Progress Notes (Signed)
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 female is here because of recently diagnosed pancreatic/duodenal cancer. I initially saw in the hospital, she is here today for follow-up.  She presented with 3-4 week history of jaundice,dark colored urine, as well as pruritus, and was admitted to [facility] on 05/15/2015. SHe also reported right lower and flank pain. She denied nausea or vomiting. She denied any cardiac or respiratory complaints. He appetite was decreased with some weight loss associated with this. On admission, her total bili was elevated at 27.6, as well as high Alk Phos at 575, AST/ALT at 169 and 222 respectively. Abdominal ultrasound on 8/19 showed a possible mass in the CBD. MRCP showed an obstructing, 3.6 cm hypovascular mass in the head of the pancreas obstructing the common bile duct. ERCP with brush biopsy, stent placement was performed on 8/23, with improvement in her liver functions. During the ERCP, a large exophytic mass in the medial duodenum was found, biopsy showed adenocarcinoma.  She developed moderate abdominal pain after the ERCP procedure, mainly in the upper left abdomen, with radiation to mid back. She takes ibuprofen as needed for the pain, which helps. No nausea or vomiting, her bowel movements is slightly constipated.She has lost about 10-15 pounds in the past few months.  MEDICAL HISTORY:  Past Medical History  Diagnosis Date  . Seizure   . Depression   . Hypertension   . Alcohol abuse     SURGICAL HISTORY: Past Surgical History  Procedure Laterality Date  . No past surgeries    . Ercp N/A 05/19/2015    Procedure: ENDOSCOPIC RETROGRADE CHOLANGIOPANCREATOGRAPHY (ERCP);  Surgeon: Teena Irani, MD;  Location: [facility] ENDOSCOPY;  Service: Endoscopy;  Laterality: N/A;    SOCIAL HISTORY: Social History   Social History  . Marital Status: Single    Spouse Name: N/A  . Number of Children: No   . Years of Education: N/A   Occupational History  . On disability due to seizure    Social History Main Topics  . Smoking status: Current Every Day Smoker -- 0.50 packs/day for 15 years    Types: Cigarettes  . Smokeless tobacco: Never Used     Comment: trying to cut back a little each day  . Alcohol Use: 12.0 oz/week    20 Cans of beer per week     Comment: 2 beers a night  05/15/2015 " 4 beers a day"  . Drug Use: No  . Sexual Activity: Not on file   Other Topics Concern  . Not on file   Social History Narrative   Single, does not regularly exercise, and completed the 7th grade.      Mother- dec.- died during childbirth, birth trauma   Brother- epilepsy- dec- 9's   Father- unknown   No known history of cancer in 1st degree family members    FAMILY HISTORY: Family History  Problem Relation Age of Onset  . Cancer Father     unknown type    ALLERGIES:  has No Known Allergies.  MEDICATIONS:  Current Outpatient Prescriptions  Medication Sig Dispense Refill  . amitriptyline (ELAVIL) 25 MG tablet TAKE ONE TABLET BY MOUTH AT BEDTIME 90 tablet 3  . diclofenac (FLECTOR) 1.3 % PTCH  PLACE 1 PATCH ONTO THE SKIN TWICE DAILY (Patient taking differently: Place 1 patch onto the skin 2 (two) times daily as needed (for pain). ) 180 patch 3  . diclofenac sodium (VOLTAREN) 1 % GEL Apply 2 g topically 4 (four) times daily. (Patient taking differently: Apply 2 g topically 2 (two) times daily as needed (for pain). ) 100 g 5  . Emollient (GOLD BOND MEDICATED BODY) 5-0.15 % LOTN Apply 1 application topically daily as needed (dry skin).    . hydrochlorothiazide (HYDRODIURIL) 25 MG tablet Take 0.5 tablets (12.5 mg total) by mouth daily. 90 tablet 3  . ibuprofen (ADVIL,MOTRIN) 400 MG tablet  Take 1 tablet (400 mg total) by mouth every 6 (six) hours as needed for moderate pain. 90 tablet 1  . levETIRAcetam (KEPPRA) 1000 MG tablet TAKE 1 AND 1/2 TABLETS BY MOUTH EVERY 12 HOURS 270 tablet 0  . Multiple Vitamin (MULTIVITAMIN WITH MINERALS) TABS tablet Take 1 tablet by mouth daily. 90 tablet 9  . traMADol (ULTRAM) 50 MG tablet Take 1 tablet (50 mg total) by mouth every 6 (six) hours as needed (Please only give tramadol if ibuprofen does not alleviate her back pain). 30 tablet 0   No current facility-administered medications for this visit.    REVIEW OF SYSTEMS:   Constitutional: Denies fevers, chills or abnormal night sweats Eyes: Denies blurriness of vision, double vision or watery eyes Ears, nose, mouth, throat, and face: Denies mucositis or sore throat Respiratory: Denies cough, dyspnea or wheezes Cardiovascular: Denies palpitation, chest discomfort or lower extremity swelling Gastrointestinal:  Denies nausea, heartburn or change in bowel habits Skin: Denies abnormal skin rashes Lymphatics: Denies new lymphadenopathy or easy bruising Neurological:Denies numbness, tingling or new weaknesses Behavioral/Psych: Mood is stable, no new changes  All other systems were reviewed with the patient and are negative.  PHYSICAL EXAMINATION: ECOG PERFORMANCE STATUS: 1 - Symptomatic but completely ambulatory  Filed Vitals:   05/29/15 1126  BP: 113/74  Pulse: 74  Temp: 98.6 F (37 C)  Resp: 18   Filed Weights   05/29/15 1126  Weight: 103 lb 3.2 oz (46.811 kg)    GENERAL:alert, no distress and comfortable SKIN: skin color, texture, turgor are normal, no rashes or significant lesions EYES: normal, conjunctiva are pink and non-injected, sclera clear OROPHARYNX:no exudate, no erythema and lips, buccal mucosa, and tongue normal  NECK: supple, thyroid normal size, non-tender, without nodularity LYMPH:  no palpable lymphadenopathy in the cervical, axillary or inguinal LUNGS: clear to  auscultation and percussion with normal breathing effort HEART: regular rate & rhythm and no murmurs and no lower extremity edema ABDOMEN:abdomen soft, non-tender and normal bowel sounds Musculoskeletal:no cyanosis of digits and no clubbing  PSYCH: alert & oriented x 3 with fluent speech NEURO: no focal motor/sensory deficits  LABORATORY DATA:  I have reviewed the data as listed CBC Latest Ref Rng 05/29/2015 05/16/2015 05/15/2015  WBC 3.9 - 10.3 10e3/uL 10.6(H) 11.5(H) 12.7(H)  Hemoglobin 11.6 - 15.9 g/dL 10.0(L) 8.4(L) 9.2(L)  Hematocrit 34.8 - 46.6 % 30.0(L) 24.5(L) 26.5(L)  Platelets 145 - 400 10e3/uL 785(H) 628(H) 687(H)    CMP Latest Ref Rng 05/22/2015 05/21/2015 05/20/2015  Glucose 65 - 99 mg/dL 195(H) 124(H) 139(H)  BUN 6 - 20 mg/dL 5(L) 7 6  Creatinine 0.44 - 1.00 mg/dL 0.58 0.60 0.57  Sodium 135 - 145 mmol/L 128(L) 130(L) 128(L)  Potassium 3.5 - 5.1 mmol/L 3.6 3.9 3.4(L)  Chloride 101 - 111 mmol/L 95(L) 99(L) 96(L)  CO2 22 - 32  mmol/L 23 20(L) 23  Calcium 8.9 - 10.3 mg/dL 9.1 9.5 8.9  Total Protein 6.5 - 8.1 g/dL 6.9 6.6 5.8(L)  Total Bilirubin 0.3 - 1.2 mg/dL 8.7(H) 10.6(H) 13.2(H)  Alkaline Phos 38 - 126 U/L 364(H) 427(H) 441(H)  AST 15 - 41 U/L 134(H) 111(H) 140(H)  ALT 14 - 54 U/L 140(H) 127(H) 132(H)   PATHOLOGY REPORT Diagnosis 05/19/2015 Duodenum, Biopsy INVASIVE ADENOCARCINOMA, SEE COMMENT. Microscopic Comment The case was reviewed with Dr. Saralyn Pilar who concurs. The case was discussed with Dr. Amedeo Plenty on 05/20/2015. (CR:kh 05/20/2015)  RADIOGRAPHIC STUDIES: I have personally reviewed the radiological images as listed and agreed with the findings in the report. Ct Chest W Contrast  05/20/2015   CLINICAL DATA:  Pancreatic cancer.  3-4 week history of jaundice.  EXAM: CT CHEST WITH CONTRAST  TECHNIQUE: Multidetector CT imaging of the chest was performed during intravenous contrast administration.  CONTRAST:  156m OMNIPAQUE IOHEXOL 300 MG/ML  SOLN  COMPARISON:  None.   FINDINGS: Mediastinum/Nodes: No mediastinal, hilar, or axillary adenopathy. Thoracic aorta is normal in caliber. The heart size is normal. No pericardial effusion.  Lungs/Pleura: No pulmonary nodule. Scattered emphysematous change primarily in the upper lobes. Trace linear atelectasis in the right lower lobe. No consolidation or pulmonary edema. No pleural effusion.  Upper abdomen: Known pancreatic head mass not included in the field of view, there is pancreatic ductal dilatation. Stomach distended with ingested contents. Biliary stent, partially included. Calcified gallstones within the gallbladder. No focal hepatic lesion in the included liver.  Musculoskeletal: There are no acute or suspicious osseous abnormalities. No lytic or sclerotic lesions.  IMPRESSION: 1. No evidence of metastatic disease in the thorax. 2. Mild emphysema.   Electronically Signed   By: MJeb LeveringM.D.   On: 05/20/2015 23:34   UKoreaAbdomen Complete  05/15/2015   CLINICAL DATA:  Jaundice.  EXAM: ULTRASOUND ABDOMEN COMPLETE  COMPARISON:  None.  FINDINGS: Gallbladder: Sludge and gallstones noted. Gallbladder is distended. Gallbladder wall thickness 1.3 mm. Negative Murphy sign.  Common bile duct: Diameter: 16 mm. A 3.1 x 1.6 x 1.2 cm soft tissue mass is noted. No shadowing noted. This could represent tumefactive sludge within the common bile duct or a tumor mass.  Liver: No focal lesion identified. Within normal limits in parenchymal echogenicity. Portal vein is patent.  IVC: No abnormality visualized.  Pancreas: Visualized portion unremarkable. Portions obscured by bowel gas.  Spleen: Size and appearance within normal limits.  Right Kidney: Length: 11.8 cm. Echogenicity within normal limits. No mass or hydronephrosis visualized.  Left Kidney: Length: 11.7 cm. Echogenicity within normal limits. No mass or hydronephrosis visualized.  Abdominal aorta: No aneurysm visualized.  Other findings: None.  IMPRESSION: 1. Sludge and gallstones  noted.  The gallbladder is distended. 2. Common bile duct is distended to 16 mm. There is a a soft tissue mass within the distal common bile duct measuring 3.1 x 1.6 x 1.2 cm. This is consistent with obstructing tumefactive sludge or a tumor. MRCP should be considered for further evaluation. Associated intrahepatic biliary ductal distention noted. These results will be called to the ordering clinician or representative by the Radiologist Assistant, and communication documented in the PACS or zVision Dashboard.   Electronically Signed   By: TMarcello Moores Register   On: 05/15/2015 17:13   Mr 3d Recon At Scanner  05/16/2015   CLINICAL DATA:  Jaundice.  Biliary dilatation on recent ultrasound.  EXAM: MRI ABDOMEN WITHOUT AND WITH CONTRAST (INCLUDING MRCP)  TECHNIQUE: Multiplanar  multisequence MR imaging of the abdomen was performed both before and after the administration of intravenous contrast. Heavily T2-weighted images of the biliary and pancreatic ducts were obtained, and three-dimensional MRCP images were rendered by post processing.  CONTRAST:  48m MULTIHANCE GADOBENATE DIMEGLUMINE 529 MG/ML IV SOLN  COMPARISON:  Ultrasound on 05/15/2015  FINDINGS: Lower chest: No acute findings.  Hepatobiliary: No liver masses identified. Diffuse intra and extrahepatic biliary ductal dilatation is seen to the level of the pancreatic head. Common bile duct measures 20 mm in diameter. Abrupt stricture of the distal common bile duct is seen.  Gallbladder is distended and contains small gallstones, however there is no definite evidence of acute cholecystitis.  Pancreas: An irregular hypovascular mass is seen in the pancreatic head measuring 3.6 x 2.5 cm on image 38/series 2001. Pancreatic ductal dilatation is seen within the body and tail.  Spleen:  Within normal limits in size and appearance.  Adrenal Glands:  No masses identified.  Kidneys: No renal masses identified. No evidence of hydronephrosis.  Stomach/Bowel/Peritoneum: No  evidence of wall thickening, mass, or obstruction involving visualized abdominal bowel.  Vascular/Lymphatic: No pathologically enlarged lymph nodes identified. No abdominal aortic aneurysm or other significant retroperitoneal abnormality demonstrated.  Other:  None.  Musculoskeletal:  No suspicious bone lesions identified.  IMPRESSION: Hypovascular mass in the pancreatic head measuring 3.6 cm, highly suspicious for pancreatic adenocarcinoma. This obstructs the distal common bile duct causing severe diffuse biliary ductal dilatation.  No definite evidence of metastatic disease within the abdomen.  Cholelithiasis, without definite signs of acute cholecystitis.   Electronically Signed   By: JEarle GellM.D.   On: 05/16/2015 09:47   Dg Ercp Biliary & Pancreatic Ducts  05/19/2015   CLINICAL DATA:  Pancreatic head mass with biliary ductal obstruction  EXAM: ERCP  TECHNIQUE: Multiple spot images obtained with the fluoroscopic device and submitted for interpretation post-procedure.  FLUOROSCOPY TIME:  Radiation Exposure Index (as provided by the fluoroscopic device): Not available  If the device does not provide the exposure index:  Fluoroscopy Time:  4 minutes 59 seconds  Number of Acquired Images:  3  COMPARISON:  05/16/2015  FINDINGS: Initial images demonstrate a wire in the common bile duct with contrast material present. Significant ductal dilatation is noted similar to that seen on prior MRI examination. A metallic stent was then placed across the obstructive lesion. Focal narrowing is noted distally in the area of obstruction. The stent appears appropriately placed.  IMPRESSION: Successful metallic stenting of a distal common bile duct obstruction.  These images were submitted for radiologic interpretation only. Please see the procedural report for the amount of contrast and the fluoroscopy time utilized.   Electronically Signed   By: MInez CatalinaM.D.   On: 05/19/2015 14:57   Dg Abd Portable 1v  05/21/2015    CLINICAL DATA:  Right flank pain and constipation for 2 days, following ERCP and CBD stent placement. Recently diagnosed pancreatic carcinoma.  EXAM: PORTABLE ABDOMEN - 1 VIEW  COMPARISON:  05/19/2015 ERCP  FINDINGS: A CBD stent is identified.  The bowel gas pattern is unremarkable except for mild-moderate right colonic stool.  No dilated bowel loops are per identified.  There appears to be gas within the gallbladder with several small stones.  IMPRESSION: Mild -moderate right colonic stool. Unremarkable bowel gas pattern otherwise.  Question gas in the gallbladder which can be seen following biliary procedure.   Electronically Signed   By: JMargarette CanadaM.D.   On: 05/21/2015 15:07  Mr Abd W/wo Cm/mrcp  05/16/2015   CLINICAL DATA:  Jaundice.  Biliary dilatation on recent ultrasound.  EXAM: MRI ABDOMEN WITHOUT AND WITH CONTRAST (INCLUDING MRCP)  TECHNIQUE: Multiplanar multisequence MR imaging of the abdomen was performed both before and after the administration of intravenous contrast. Heavily T2-weighted images of the biliary and pancreatic ducts were obtained, and three-dimensional MRCP images were rendered by post processing.  CONTRAST:  24m MULTIHANCE GADOBENATE DIMEGLUMINE 529 MG/ML IV SOLN  COMPARISON:  Ultrasound on 05/15/2015  FINDINGS: Lower chest: No acute findings.  Hepatobiliary: No liver masses identified. Diffuse intra and extrahepatic biliary ductal dilatation is seen to the level of the pancreatic head. Common bile duct measures 20 mm in diameter. Abrupt stricture of the distal common bile duct is seen.  Gallbladder is distended and contains small gallstones, however there is no definite evidence of acute cholecystitis.  Pancreas: An irregular hypovascular mass is seen in the pancreatic head measuring 3.6 x 2.5 cm on image 38/series 2001. Pancreatic ductal dilatation is seen within the body and tail.  Spleen:  Within normal limits in size and appearance.  Adrenal Glands:  No masses identified.   Kidneys: No renal masses identified. No evidence of hydronephrosis.  Stomach/Bowel/Peritoneum: No evidence of wall thickening, mass, or obstruction involving visualized abdominal bowel.  Vascular/Lymphatic: No pathologically enlarged lymph nodes identified. No abdominal aortic aneurysm or other significant retroperitoneal abnormality demonstrated.  Other:  None.  Musculoskeletal:  No suspicious bone lesions identified.  IMPRESSION: Hypovascular mass in the pancreatic head measuring 3.6 cm, highly suspicious for pancreatic adenocarcinoma. This obstructs the distal common bile duct causing severe diffuse biliary ductal dilatation.  No definite evidence of metastatic disease within the abdomen.  Cholelithiasis, without definite signs of acute cholecystitis.   Electronically Signed   By: JEarle GellM.D.   On: 05/16/2015 09:47    ASSESSMENT & PLAN: 49year old African-American female, with past medical history of seizure, hypertension, on disability, presented with jaundice and abdominal pain.  1. Pancreatic adenocarcinoma invading duodenum, cT3N0M0, stage IIA -I reviewed her CT scan, ERCP findings and the the biopsy results. -This is likely pancreatic adenocarcinoma with direct invading duodenum, although duodenal adenocarcinoma invading pancreas is also possible, but felt to be less likely. -Her case was presented in our GI tumor board earlier this week, the mass didn't that does not directly invading vascular structure, and felt to be resectable by Whipple procedure. -She is being seen by surgeon Dr. BBarry Dienestoday, and will likely proceed with surgery soon. -I discussed the role of adjuvant chemotherapy in pancreatic cancer. I plan to see her back after surgery, and will likely offer her adjuvant chemotherapy with gemcitabine.  2. Anemia - there are study showed significantly elevated ferritin, slightly low serum iron m and low transferrin saturation.  -She has iron deficient anemia, also component of  anemia of malignancy. -I suggest her to take oral iron pill -Her therapeutic hemoglobin is 10 today, no need for blood transfusion.  3. Obstructive jaundice, status post CBD stenting -A repeated bilirubin is 7.18 today, improved  4. Hypokalemia -Potassium 2.7 today. I given her oral potassium 40 mEq today, and a prescription of potassium 20 mEq twice daily for 7 days.  5. Seizure -Continue antiseizure medication  PLAN -She will likely have surgery Whipple procedure soon by Dr. BBarry Dienes-I will see her 2 weeks after her hosiital discharge   All questions were answered. The patient knows to call the clinic with any problems, questions or concerns. I spent 30  minutes counseling the patient face to face. The total time spent in the appointment was 40 minutes and more than 50% was on counseling.     Truitt Merle, MD 05/29/2015 11:44 AM

## 2015-05-29 NOTE — Progress Notes (Signed)
[facility] Psychosocial Distress Screening Clinical Social Work  Clinical Social Work met with pt at [facility] to introduce self and explain role of CSW team and review the distress screening protocol.  CSW assisted pt with transportation today to GI Clinic due to a mix up with Medicaid transportation. Pt also got the address of this appt mixed up. Pt reports to rely on Medicaid transportation and is aware of how to use this resource to get to medical appointments. She reports they will pick her up after her appt today.   The patient scored a 5 on the Psychosocial Distress Thermometer which indicates moderate distress. Pt reports her biggest concern is food due to her food stamps recently stopped. Pt has reapplied and is attempting to get this restarted. She reports she has left messages for her caseworker and hopes this will start soon. CSW willing to assist if pt can provide contact number. Pt also met with Dory Peru, dietician today. Pt states MD addressed her pain concerns as well. Pt reports to live with a friend, but has only an aunt for family in the area. Pt reports to have good support from her church family as well.  Clinical Social Worker reviewed Pt and Family Support team and programs, such as [facility], with pt and she agrees to CSW following for needs. CSW to see at future appointments and Pt will contact with food stamp info.   ONCBCN DISTRESS SCREENING 05/29/2015  Screening Type Initial Screening  Distress experienced in past week (1-10) 5  Practical problem type Food  Spiritual/Religous concerns type Relating to God  Physical Problem type Pain;Breathing;Tingling hands/feet  Physician notified of physical symptoms Yes  Referral to clinical social work Yes  Referral to dietition Yes    Clinical Social Worker follow up needed: Yes.    If yes, follow up plan: See above Loren Racer, [place] Worker [place]  [facility] Phone: [phone] Fax: [phone]

## 2015-05-29 NOTE — Progress Notes (Signed)
Patient was seen in GI clinic.  48 year old female diagnosed with pancreas cancer.  She is a patient of Dr. Burr Medico.  Past medical history includes alcohol, seizures, depression, hypertension, and tobacco.  Medications include multivitamin.  Labs include sodium 132, potassium 2.7, and albumin 3.0 on September 2.  Height: 5 feet 0 inches. Weight: 103 pounds. Usual body weight: 120 pounds. BMI: 20.15.  Patient reports she has difficulty affording food. States "they" took her food stamps away. She has a good knowledge about which foods are higher quality nutrition for less money. She enjoys drinking Ensure Plus but has not been able to afford purchasing. Patient has had an approximate 17 pound weight loss from usual body weight. Patient reports signs of constipation but states this is improving. Reports she drinks a lot of water throughout the day.  Nutrition diagnosis: Malnutrition related to pancreas cancer as evidenced by 14% weight loss from usual body weight and depletion of muscle and fat stores.  Intervention: Patient educated to try to increase oral intake throughout the day. Reviewed options for low-cost foods. Provided one complementary case of Ensure Plus and encouraged patient to consume 3 times a day between meals. Encouraged patient to contact me with questions or concerns. I will try to provide her with oral nutrition supplements. Questions were answered.  Teach back method used and my contact information was provided.  Monitoring, evaluation, goals: Patient will tolerate increased calories and protein along with oral nutrition supplements 3 times a day to minimize further weight loss and promote repletion.  Next visit: To be scheduled.  **Disclaimer: This note was dictated with voice recognition software. Similar sounding words can inadvertently be transcribed and this note may contain transcription errors which may not have been corrected upon publication of  note.**

## 2015-05-29 NOTE — Patient Instructions (Signed)
Toileting Techniques for Bowel Movements (Defecation) Using your belly (abdomen) and pelvic floor muscles to have a bowel movement is usually instinctive.  Sometimes people can have problems with these muscles and have to relearn proper defecation (emptying) techniques.  If you have weakness in your muscles, organs that are falling out, decreased sensation in your pelvis, or ignore your urge to go, you may find yourself straining to have a bowel movement.  You are straining if you are: . holding your breath or taking in a huge gulp of air and holding it  . keeping your lips and jaw tensed and closed tightly . turning red in the face because of excessive pushing or forcing . developing or worsening your  hemorrhoids . getting faint while pushing . not emptying completely and have to defecate many times a day  If you are straining, you are actually making it harder for yourself to have a bowel movement.  Many people find they are pulling up with the pelvic floor muscles and closing off instead of opening the anus. Due to lack pelvic floor relaxation and coordination the abdominal muscles, one has to work harder to push the feces out.  Many people have never been taught how to defecate efficiently and effectively.  Notice what happens to your body when you are having a bowel movement.  While you are sitting on the toilet pay attention to the following areas: . Jaw and mouth position . Angle of your hips   . Whether your feet touch the ground or not . Arm placement  . Spine position . Waist . Belly tension . Anus (opening of the anal canal)  An Evacuation/Defecation Plan   Here are the 4 basic points:  1. Lean forward enough for your elbows to rest on your knees 2. Support your feet on the floor or use a low stool if your feet don't touch the floor  3. Push out your belly as if you have swallowed a beach ball-you should feel a widening of your waist 4. Open and relax your pelvic floor muscles,  rather than tightening around the anus      The following conditions my require modifications to your toileting posture:  . If you have had surgery in the past that limits your back, hip, pelvic, knee or ankle flexibility . Constipation   Your healthcare practitioner may make the following additional suggestions and adjustments:  1) Sit on the toilet  a) Make sure your feet are supported. b) Notice your hip angle and spine position-most people find it effective to lean forward or raise their knees, which can help the muscles around the anus to relax  c) When you lean forward, place your forearms on your thighs for support  2) Relax suggestions a) Breath deeply in through your nose and out slowly through your mouth as if you are smelling the flowers and blowing out the candles. b) To become aware of how to relax your muscles, contracting and releasing muscles can be helpful.  Pull your pelvic floor muscles in tightly by using the image of holding back gas, or closing around the anus (visualize making a circle smaller) and lifting the anus up and in.  Then release the muscles and your anus should drop down and feel open. Repeat 5 times ending with the feeling of relaxation. c) Keep your pelvic floor muscles relaxed; let your belly bulge out. d) The digestive tract starts at the mouth and ends at the anal opening, so be   sure to relax both ends of the tube.  Place your tongue on the roof of your mouth with your teeth separated.  This helps relax your mouth and will help to relax the anus at the same time.  3) Empty (defecation) a) Keep your pelvic floor and sphincter relaxed, then bulge your anal muscles.  Make the anal opening wide.  b) Stick your belly out as if you have swallowed a beach ball. c) Make your belly wall hard using your belly muscles while continuing to breathe. Doing this makes it easier to open your anus. d) Breath out and give a grunt (or try using other sounds such as  ahhhh, shhhhh, ohhhh or grrrrrrr).  4) Finish a) As you finish your bowel movement, pull the pelvic floor muscles up and in.  This will leave your anus in the proper place rather than remaining pushed out and down. If you leave your anus pushed out and down, it will start to feel as though that is normal and give you incorrect signals about needing to have a bowel movement.     Tips for Energy Conservation for Activities of Daily Living . Plan ahead to avoid rushing. . Sit down to bathe and dry off. Wear a terry robe instead of drying off. . Use a shower/bath organizer to decrease leaning and reaching. . Use extension handles on sponges and brushes. Susa Simmonds grab rails in the bathroom or use an elevated toilet seat. Hoyle Barr out clothes and toiletries before dressing. . Minimize leaning over to put on clothes and shoes. Bring your foot to your knee to apply socks and shoes. . Wear comfortable shoes and low-heeled, slip on shoes. Wear button front shirts rather than pullovers. Housekeeping . Schedule household tasks throughout the week. . Do housework sitting down when possible. . Delegate heavy housework, shopping, laundry and child care when possible. . Drag or slide objects rather than lifting. . Sit when ironing and take rest periods. . Stop working before becoming overly tired. Shopping . Organize list by aisle. . Use a grocery cart for support. Marland Kitchen Shop at less busy times. . Ask for help with getting to the car. Meal Preparation . Use convenience and easy-to-prepare foods. . Use small appliances that take less effort to use. Marland Kitchen Prepare meals sitting down. . Soak dishes instead of scrubbing and let dishes air dry. . Prepare double portions and freeze half. Child Care . Plan activities that can be done sitting down, such as drawing pictures, playing games, reading, and computer games. . Encourage children to climb up onto your lap or into the highchair instead of being  lifted. . Make a game of the household chores so that children will want to help. . Delegate child care when possible.   Ways to get started on an exercise program 1.  Start for 10 minutes per day with a walking program. 2. Work towards 30 minutes of exercise per day 3. When you do an aerobic exercise program start on a low level 4. Water aerobics is a good place due to decreased strain on your joints 5. Begin your exercise program gradually and progress slowly over time 6. When exercising use correct form. a. Keep neutral spine b. Engage abdominals c. Keep chest up  d. Chin down e. Do not lock your knees     Try these techniques to guard against pain after abdominal surgery: . Squeeze a pillow to your chest when coughing. . To get out of bed, squeeze  a pillow to your chest and roll onto your side first.  Then, push up on your elbow to sit up while still hugging the pillow with the other arm. . To move to the edge of the bed, continue to hug the pillow while you scoot your hips to the edge of the bed. . If you must use your hands to assist with standing up, place your hands on your knees, lean forward, and stand up using your legs primarily.  Place as little weight on your arms as possible. Earlie Counts, [facility] [place], [place], [place] [address], [phone]

## 2015-05-29 NOTE — Telephone Encounter (Signed)
Gave and printed appt avs

## 2015-06-02 ENCOUNTER — Inpatient Hospital Stay: Payer: Medicaid Other | Admitting: Hematology

## 2015-06-02 ENCOUNTER — Other Ambulatory Visit: Payer: Medicaid Other

## 2015-06-03 ENCOUNTER — Encounter: Payer: Self-pay | Admitting: Licensed Clinical Social Worker

## 2015-06-03 ENCOUNTER — Encounter: Payer: Self-pay | Admitting: Internal Medicine

## 2015-06-03 ENCOUNTER — Ambulatory Visit (INDEPENDENT_AMBULATORY_CARE_PROVIDER_SITE_OTHER): Payer: Medicaid Other | Admitting: Internal Medicine

## 2015-06-03 VITALS — BP 130/76 | HR 85 | Temp 97.8°F | Ht 60.0 in | Wt 105.5 lb

## 2015-06-03 DIAGNOSIS — K869 Disease of pancreas, unspecified: Secondary | ICD-10-CM

## 2015-06-03 DIAGNOSIS — D509 Iron deficiency anemia, unspecified: Secondary | ICD-10-CM | POA: Diagnosis not present

## 2015-06-03 DIAGNOSIS — E876 Hypokalemia: Secondary | ICD-10-CM

## 2015-06-03 DIAGNOSIS — G40909 Epilepsy, unspecified, not intractable, without status epilepticus: Secondary | ICD-10-CM

## 2015-06-03 DIAGNOSIS — D638 Anemia in other chronic diseases classified elsewhere: Secondary | ICD-10-CM | POA: Diagnosis not present

## 2015-06-03 DIAGNOSIS — K8689 Other specified diseases of pancreas: Secondary | ICD-10-CM

## 2015-06-03 DIAGNOSIS — K5900 Constipation, unspecified: Secondary | ICD-10-CM | POA: Diagnosis not present

## 2015-06-03 DIAGNOSIS — R569 Unspecified convulsions: Secondary | ICD-10-CM

## 2015-06-03 DIAGNOSIS — K868 Other specified diseases of pancreas: Secondary | ICD-10-CM | POA: Diagnosis not present

## 2015-06-03 MED ORDER — OXYCODONE-ACETAMINOPHEN 5-325 MG PO TABS: 1.0000 | ORAL_TABLET | Freq: Four times a day (QID) | ORAL | Status: DC | PRN

## 2015-06-03 MED ORDER — POLYETHYLENE GLYCOL 3350 17 G PO PACK: 17.0000 g | PACK | Freq: Two times a day (BID) | ORAL | Status: AC

## 2015-06-03 MED ORDER — SENNA 8.6-50 MG PO TABS: 1.0000 | ORAL_TABLET | Freq: Two times a day (BID) | ORAL | Status: AC

## 2015-06-03 NOTE — Assessment & Plan Note (Addendum)
Complaint with Keppra- presently in 1500mg  -am, and 1000mg - Pm. Keppra dosing adjusted while in patient, Blood levels were supra-therapeutic, as it was thought to be related to elevated bilirubin levels.   Plan- keppra level today  Addendum- Keppra levels elevated. Requested the assistance of our pharmacist, we decided to level medication at current dosing till she follows up with her neurologist, considering she is having seizures at present dosing. Last seizure episode about 1 month ago. She is supposed to see her neurologist. She says she wants to get through with her surgery first- ?whipples procedure then she will follow up with her neurologist. She might need an additional anti-seizure medication to help control her seizures.

## 2015-06-03 NOTE — Progress Notes (Signed)
Patient ID: Shelia Arnold, female   DOB: June 05, 1966, 49 y.o.   MRN: [id number]   Subjective:   Patient ID: Shelia Arnold female   DOB: 1965/11/12 49 y.o.   MRN: [id number]  HPI: Shelia Arnold is a 49 y.o. with PMH listed below presented to Hospital follow up visit. Was  admitted- 8/19- 05/22/2015. She was diagnosed and managed for Pancreatic adenocarcinoma. She has is following up with Oncology and General surgery and planned for a whipple and possibly adjuvant chemotherapy.  Only complaint today is constipation for three days, she is asking for a refill of Iburopfen 800mg  Q6H, which relives her abdominal pain, . her appetite is good.  She has not had any seizures since discharge, her keppra dosing was adjusted on discharge, but she is presently taking 1500mg  - Am and 1000mg  at night.  Past Medical History  Diagnosis Date  . Seizure   . Depression   . Hypertension   . Alcohol abuse    Current Outpatient Prescriptions  Medication Sig Dispense Refill  . amitriptyline (ELAVIL) 25 MG tablet TAKE ONE TABLET BY MOUTH AT BEDTIME 90 tablet 3  . diclofenac (FLECTOR) 1.3 % PTCH PLACE 1 PATCH ONTO THE SKIN TWICE DAILY (Patient taking differently: Place 1 patch onto the skin 2 (two) times daily as needed (for pain). ) 180 patch 3  . diclofenac sodium (VOLTAREN) 1 % GEL Apply 2 g topically 4 (four) times daily. (Patient taking differently: Apply 2 g topically 2 (two) times daily as needed (for pain). ) 100 g 5  . Emollient (GOLD BOND MEDICATED BODY) 5-0.15 % LOTN Apply 1 application topically daily as needed (dry skin).    . hydrochlorothiazide (HYDRODIURIL) 25 MG tablet Take 0.5 tablets (12.5 mg total) by mouth daily. 90 tablet 3  . ibuprofen (ADVIL,MOTRIN) 400 MG tablet Take 1 tablet (400 mg total) by mouth every 6 (six) hours as needed for moderate pain. 90 tablet 1  . levETIRAcetam (KEPPRA) 1000 MG tablet TAKE 1 AND 1/2 TABLETS BY MOUTH EVERY 12 HOURS 270 tablet 0  . Multiple Vitamin  (MULTIVITAMIN WITH MINERALS) TABS tablet Take 1 tablet by mouth daily. 90 tablet 9  . oxyCODONE-acetaminophen (PERCOCET/ROXICET) 5-325 MG per tablet Take 1 tablet by mouth every 6 (six) hours as needed for severe pain. 30 tablet 0  . polyethylene glycol (MIRALAX / GLYCOLAX) packet Take 17 g by mouth 2 (two) times daily. 14 each 0  . potassium chloride SA (K-DUR,KLOR-CON) 20 MEQ tablet Take 1 tablet (20 mEq total) by mouth 2 (two) times daily. 60 tablet 0  . Sennosides-Docusate Sodium (SENNA) 8.6-50 MG TABS Take 1 tablet by mouth 2 (two) times daily. 7 each 0  . traMADol (ULTRAM) 50 MG tablet Take 1 tablet (50 mg total) by mouth every 6 (six) hours as needed (Please only give tramadol if ibuprofen does not alleviate her back pain). 30 tablet 0   No current facility-administered medications for this visit.   Family History  Problem Relation Age of Onset  . Cancer Father     unknown type   Social History   Social History  . Marital Status: Single    Spouse Name: N/A  . Number of Children: N/A  . Years of Education: N/A   Social History Main Topics  . Smoking status: Current Every Day Smoker -- 0.30 packs/day for 15 years    Types: Cigarettes  . Smokeless tobacco: Never Used     Comment: trying to cut back a little  each day  . Alcohol Use: 12.0 oz/week    20 Cans of beer per week     Comment: 2 beers a night  05/15/2015 " 4 beers a day"  . Drug Use: No  . Sexual Activity: Not Asked   Other Topics Concern  . None   Social History Narrative   Single, does not regularly exercise, and completed the 7th grade.   Rents a room in private residence   Mother- dec.- died during childbirth, birth trauma   Brother- epilepsy- dec- 58's   Father- unknown   No known history of cancer in 1st degree family members   Review of Systems: CONSTITUTIONAL- No Fever, or  change in appetite. SKIN- No Rash, colour changes or itching. HEAD- No Headache or dizziness. RESPIRATORY- No Cough or  SOB. CARDIAC- No chest pain. URINARY- No Frequency, urgency, straining or dysuria. NEUROLOGIC- Last seizure episode was about 1- 2 month ago  Objective:  Physical Exam: Filed Vitals:   06/03/15 1322  BP: 130/76  Pulse: 85  Temp: 97.8 F (36.6 C)  TempSrc: Oral  Height: 5' (1.524 m)  Weight: 105 lb 8 oz (47.854 kg)  SpO2: 100%   GENERAL- alert, not in any distress. HEENT- Atraumatic, normocephalic, PERRL, EOMI, CARDIAC- RRR, no murmurs, rubs or gallops. RESP- Moving equal volumes of air, and clear to auscultation bilaterally, no wheezes or crackles. ABDOMEN- Soft, nontender, slightly full, no guarding or rebound, bowel sounds present. NEURO- No obvious Cr N abnormality, Gait- Normal. EXTREMITIES- pulse 2+, symmetric, no pedal edema. SKIN- Warm, dry, No rash or lesion. PSYCH- Normal mood and affect, appropriate thought content and speech.  Assessment & Plan:  The patient's case and plan of care was discussed with attending physician, Dr. Ellwood Dense.  Please see problem based charting for assessment and plan

## 2015-06-03 NOTE — Assessment & Plan Note (Addendum)
Complaints of abdominal pain- Has been on Ibuprofen 800mg  Q6H, though this works, but pt has iron deficiency anemia on Anemia of chronic dx, is post menopausal, has some epigastric pain, concern for Gastritis, PUD, with high dose and prolonged use, will switch to Hydrocodone- acetaminophen- 5-325mg  Q6H, PRN.

## 2015-06-03 NOTE — Progress Notes (Signed)
Internal Medicine Clinic Attending  Case discussed with Dr. Emokpae at the time of the visit.  We reviewed the resident's history and exam and pertinent patient test results.  I agree with the assessment, diagnosis, and plan of care documented in the resident's note.  

## 2015-06-03 NOTE — Patient Instructions (Signed)
I have prescribed 2 medications for you to help with your constipation, take each one in the morning and at night. Your low potassium level is making your constipation worse. For now, I have made some changes to your pain medication, I have prescribed Hydrocodone which is a stronger pain medication. This is because of the possible side effects of the ibuprofen.

## 2015-06-03 NOTE — Assessment & Plan Note (Signed)
K- 2.7. On Oral supplimnetation.- 4meq BID. Also constipated, likely ileus from hypokalemia.   - Bmet today - Mag level. - Miralax BID and senakot- 8.6-50mg  BID. Till she has a bowel movement.

## 2015-06-04 LAB — MAGNESIUM: Magnesium: 1.9 mg/dL (ref 1.6–2.3)

## 2015-06-04 LAB — BASIC METABOLIC PANEL
BUN/Creatinine Ratio: 26 — ABNORMAL HIGH (ref 9–23)
BUN: 15 mg/dL (ref 6–24)
CALCIUM: 10.4 mg/dL — AB (ref 8.7–10.2)
CO2: 22 mmol/L (ref 18–29)
CREATININE: 0.57 mg/dL (ref 0.57–1.00)
Chloride: 93 mmol/L — ABNORMAL LOW (ref 97–108)
GFR, EST AFRICAN AMERICAN: 126 mL/min/{1.73_m2} (ref >59)
GFR, EST NON AFRICAN AMERICAN: 109 mL/min/{1.73_m2} (ref >59)
Glucose: 71 mg/dL (ref 65–99)
Potassium: 4 mmol/L (ref 3.5–5.2)
Sodium: 137 mmol/L (ref 134–144)

## 2015-06-04 NOTE — Progress Notes (Signed)
CSW was able to meet with Shelia Arnold during her scheduled [facility] appointment.  Pt was able to arrange her transportation to this appointment and has the agency information available for medical transportation.  CSW discussed during last meeting pt request SCAT, pt still determining if she would like to complete the SCAT application process.  Shelia Arnold took with her the PART A application along with a envelope to return to this worker.  PART B ROI signed.   Mental Health - "Why do I need counseling?"  CSW discussed physician last month placed referral for pt to receive information for behavioral health services for depression.  Pt agreeable and states she will look over the resources.  CSW informed Shelia Arnold no referral is needed and pt can seek services when she is ready.  CSW placed information in folder and provided to pt.  Shelia Arnold denies add'l social work needs at this time.  Pt aware CSW is available to assist as needed.

## 2015-06-05 ENCOUNTER — Telehealth: Payer: Self-pay | Admitting: *Deleted

## 2015-06-05 LAB — LEVETIRACETAM LEVEL: LEVETIRACETAM: 55 ug/mL — AB (ref 10.0–40.0)

## 2015-06-05 NOTE — Telephone Encounter (Signed)
  Oncology Nurse Navigator Documentation    Navigator Encounter Type: Telephone (06/05/15 1310): 2 week follow up  Shelia Arnold reports her bowels are moving now and pain is under control. Confirms her surgery is scheduled for 06/25/15 at [facility]. Denies any needs at this time.

## 2015-06-08 ENCOUNTER — Telehealth: Payer: Self-pay | Admitting: Internal Medicine

## 2015-06-08 NOTE — Telephone Encounter (Signed)
Patient calling states that the dr changed one of her medications that she didn't want to change.

## 2015-06-08 NOTE — Telephone Encounter (Signed)
Called pt and her seizure meds were not changed.   She was confused and thought Percocet was for seizures. She now knows it is for pain.

## 2015-06-12 ENCOUNTER — Encounter: Payer: Self-pay | Admitting: *Deleted

## 2015-06-12 NOTE — Progress Notes (Signed)
[place] Work  Clinical Social Work was referred by patient for assistance with nutritional needs. CSW made referral to dietician for assistance with this need. Pt reports things are going well, but can not get food stamps to assist with Ensure and has little income.  CSW to follow.      Loren Racer, [place] Worker [place]  [place] Phone: [phone] Fax: [phone]

## 2015-06-15 ENCOUNTER — Encounter: Payer: Self-pay | Admitting: Nutrition

## 2015-06-15 NOTE — Progress Notes (Signed)
Provided second complimentary case of ensure Plus.

## 2015-06-16 ENCOUNTER — Encounter (HOSPITAL_COMMUNITY)
Admission: RE | Admit: 2015-06-16 | Discharge: 2015-06-16 | Disposition: A | Discharge status: Home / Self Care | Payer: Medicaid Other | Source: Ambulatory Visit | Attending: General Surgery | Admitting: General Surgery

## 2015-06-16 ENCOUNTER — Ambulatory Visit
Admission: RE | Admit: 2015-06-16 | Discharge: 2015-06-16 | Disposition: A | Discharge status: Home / Self Care | Payer: Medicaid Other | Source: Ambulatory Visit

## 2015-06-16 ENCOUNTER — Encounter (HOSPITAL_COMMUNITY): Payer: Self-pay

## 2015-06-16 DIAGNOSIS — Z1231 Encounter for screening mammogram for malignant neoplasm of breast: Secondary | ICD-10-CM

## 2015-06-16 DIAGNOSIS — F1010 Alcohol abuse, uncomplicated: Secondary | ICD-10-CM | POA: Insufficient documentation

## 2015-06-16 DIAGNOSIS — Z8673 Personal history of transient ischemic attack (TIA), and cerebral infarction without residual deficits: Secondary | ICD-10-CM | POA: Insufficient documentation

## 2015-06-16 DIAGNOSIS — Z01818 Encounter for other preprocedural examination: Secondary | ICD-10-CM | POA: Diagnosis not present

## 2015-06-16 DIAGNOSIS — I252 Old myocardial infarction: Secondary | ICD-10-CM | POA: Diagnosis not present

## 2015-06-16 DIAGNOSIS — Z79899 Other long term (current) drug therapy: Secondary | ICD-10-CM | POA: Diagnosis not present

## 2015-06-16 DIAGNOSIS — Z01812 Encounter for preprocedural laboratory examination: Secondary | ICD-10-CM | POA: Diagnosis not present

## 2015-06-16 DIAGNOSIS — Z0183 Encounter for blood typing: Secondary | ICD-10-CM | POA: Insufficient documentation

## 2015-06-16 DIAGNOSIS — I10 Essential (primary) hypertension: Secondary | ICD-10-CM | POA: Diagnosis not present

## 2015-06-16 DIAGNOSIS — C801 Malignant (primary) neoplasm, unspecified: Secondary | ICD-10-CM | POA: Diagnosis not present

## 2015-06-16 HISTORY — DX: Acute myocardial infarction, unspecified: I21.9

## 2015-06-16 HISTORY — DX: Headache: R51

## 2015-06-16 HISTORY — DX: Cerebral infarction, unspecified: I63.9

## 2015-06-16 HISTORY — DX: Unspecified osteoarthritis, unspecified site: M19.90

## 2015-06-16 HISTORY — DX: Headache, unspecified: R51.9

## 2015-06-16 LAB — CBC WITH DIFFERENTIAL/PLATELET
BASOS ABS: 0 10*3/uL (ref 0.0–0.1)
Basophils Relative: 0 %
EOS PCT: 1 %
Eosinophils Absolute: 0.1 10*3/uL (ref 0.0–0.7)
HCT: 36.8 % (ref 36.0–46.0)
HEMOGLOBIN: 12 g/dL (ref 12.0–15.0)
LYMPHS ABS: 3.1 10*3/uL (ref 0.7–4.0)
LYMPHS PCT: 29 %
MCH: 31 pg (ref 26.0–34.0)
MCHC: 32.6 g/dL (ref 30.0–36.0)
MCV: 95.1 fL (ref 78.0–100.0)
Monocytes Absolute: 0.4 10*3/uL (ref 0.1–1.0)
Monocytes Relative: 4 %
NEUTROS ABS: 7.1 10*3/uL (ref 1.7–7.7)
NEUTROS PCT: 66 %
PLATELETS: 462 10*3/uL — AB (ref 150–400)
RBC: 3.87 MIL/uL (ref 3.87–5.11)
RDW: 15 % (ref 11.5–15.5)
WBC: 10.7 10*3/uL — AB (ref 4.0–10.5)

## 2015-06-16 LAB — URINALYSIS, ROUTINE W REFLEX MICROSCOPIC
Bilirubin Urine: NEGATIVE
Glucose, UA: NEGATIVE mg/dL
Hgb urine dipstick: NEGATIVE
Ketones, ur: NEGATIVE mg/dL
Nitrite: NEGATIVE
PH: 7.5 (ref 5.0–8.0)
PROTEIN: NEGATIVE mg/dL
Specific Gravity, Urine: 1.02 (ref 1.005–1.030)
Urobilinogen, UA: 0.2 mg/dL (ref 0.0–1.0)

## 2015-06-16 LAB — COMPREHENSIVE METABOLIC PANEL
ALK PHOS: 136 U/L — AB (ref 38–126)
ALT: 38 U/L (ref 14–54)
AST: 39 U/L (ref 15–41)
Albumin: 3.6 g/dL (ref 3.5–5.0)
Anion gap: 10 (ref 5–15)
BUN: 17 mg/dL (ref 6–20)
CALCIUM: 10.2 mg/dL (ref 8.9–10.3)
CHLORIDE: 103 mmol/L (ref 101–111)
CO2: 26 mmol/L (ref 22–32)
CREATININE: 0.57 mg/dL (ref 0.44–1.00)
GFR calc Af Amer: >60 mL/min (ref >60)
Glucose, Bld: 104 mg/dL — ABNORMAL HIGH (ref 65–99)
Potassium: 3.4 mmol/L — ABNORMAL LOW (ref 3.5–5.1)
Sodium: 139 mmol/L (ref 135–145)
Total Bilirubin: 2.5 mg/dL — ABNORMAL HIGH (ref 0.3–1.2)
Total Protein: 7.1 g/dL (ref 6.5–8.1)

## 2015-06-16 LAB — PREPARE RBC (CROSSMATCH)

## 2015-06-16 LAB — URINE MICROSCOPIC-ADD ON

## 2015-06-16 LAB — PROTIME-INR
INR: 0.98 (ref 0.00–1.49)
Prothrombin Time: 13.2 seconds (ref 11.6–15.2)

## 2015-06-16 LAB — ABO/RH: ABO/RH(D): B POS

## 2015-06-16 LAB — LIPASE, BLOOD: LIPASE: 40 U/L (ref 22–51)

## 2015-06-16 NOTE — Pre-Procedure Instructions (Signed)
    Jakaila Norment Sundby  06/16/2015      [facility] [place], [place] [address] Phone: [phone] Fax: [place] [id number] [facility] [place], [place] [address] Phone: [phone] Fax: [phone]    Your procedure is scheduled on 06-25-2015  Thursday .  Report to [facility] Admitting at 5:30  A.M.   Call this number if you have problems the morning of surgery:  [phone]   Remember:  Do not eat food or drink liquids after midnight.   Take these medicines the morning of surgery with A SIP OF WATER Keppra(levetiracetam),pain medication if needed,Potassium(K-Dur)   Do not wear jewelry, make-up or nail polish.  Do not wear lotions, powders, or perfumes.  You may not wear deodorant.  Do not shave 48 hours prior to surgery.   .  Do not bring valuables to the hospital.  [facility] is not responsible for any belongings or valuables.  Contacts, dentures or bridgework may not be worn into surgery.  Leave your suitcase in the car.  After surgery it may be brought to your room.  For patients admitted to the hospital, discharge time will be determined by your treatment team.      Special instructions:  See attached sheet for instructions on CHG shower  DO A [place]. THE FLEETS ENEMA CAN BE PURCHASED AT ANY DRUG STORE.  Please read over the following fact sheets that you were given. Pain Booklet, Coughing and Deep Breathing, Blood Transfusion Information and Surgical Site Infection Prevention

## 2015-06-16 NOTE — Progress Notes (Signed)
Dr. Marlowe Aschoff office notified that patient stated that she did not know if she would be able to afford the Fleets enemas that are ordered.

## 2015-06-16 NOTE — Progress Notes (Signed)
Pt. States that she had a MI in the past. Does not remember the date denies any cardiac testing does not see a cardiologist. Denies any chest pain or discomfort at this time.

## 2015-06-17 NOTE — Progress Notes (Addendum)
Anesthesia Chart Review:  Pt is 49 year old female scheduled for diagnostic laparoscopy, possible Whipple procedure on 06/25/2015 with Dr. Barry Dienes.   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 S/p ERCP 05/19/15. Current smoker. BMI 21.   Medications include: hctz, keppra, potassium.   Preoperative labs reviewed.    Chest CT 05/20/2015:  1. No evidence of metastatic disease in the thorax. 2. Mild emphysema.  EKG 05/16/2015: NSR. Biatrial enlargement. Rightward axis. T wave abnormality, consider anterior ischemia. T wave changes are new since 2014.   Reviewed EKG/case with Dr. Deatra Canter. Pt will need cardiac clearance prior to surgery.   Willeen Cass, FNP-BC [facility]/Anesthesiology Phone: [phone] 06/17/2015 3:30 PM  Addendum:  Truitt Merle, NP saw pt for cardiac evaluation 06/22/15. Stress test ordered.   Nuclear stress test 06/24/2015:  -Low risk stress nuclear study with a small mid-anterior fixed mild perfusion abnormality, likely representing breast attenuation artifact. -There is otherwise normal perfusion and there are no reversible abnormalities. -Normal left ventricular regional and global systolic function.  Ms. Servando Snare made a note on stress test results clearing pt for surgery.   If no changes, I anticipate pt can proceed with surgery as scheduled.   Willeen Cass, FNP-BC [facility]/Anesthesiology Phone: [phone] 06/24/2015 4:30 PM

## 2015-06-22 ENCOUNTER — Telehealth (HOSPITAL_COMMUNITY): Payer: Self-pay | Admitting: Radiology

## 2015-06-22 ENCOUNTER — Ambulatory Visit: Payer: Medicaid Other | Admitting: Nurse Practitioner

## 2015-06-22 ENCOUNTER — Ambulatory Visit (INDEPENDENT_AMBULATORY_CARE_PROVIDER_SITE_OTHER): Payer: Medicaid Other | Admitting: Nurse Practitioner

## 2015-06-22 ENCOUNTER — Encounter: Payer: Self-pay | Admitting: Nurse Practitioner

## 2015-06-22 ENCOUNTER — Other Ambulatory Visit: Payer: Self-pay | Admitting: Internal Medicine

## 2015-06-22 VITALS — BP 150/90 | HR 99 | Ht 60.0 in | Wt 106.8 lb

## 2015-06-22 DIAGNOSIS — F1910 Other psychoactive substance abuse, uncomplicated: Secondary | ICD-10-CM | POA: Diagnosis not present

## 2015-06-22 DIAGNOSIS — I259 Chronic ischemic heart disease, unspecified: Secondary | ICD-10-CM | POA: Diagnosis not present

## 2015-06-22 DIAGNOSIS — I10 Essential (primary) hypertension: Secondary | ICD-10-CM

## 2015-06-22 DIAGNOSIS — Z01818 Encounter for other preprocedural examination: Secondary | ICD-10-CM | POA: Diagnosis not present

## 2015-06-22 DIAGNOSIS — R9431 Abnormal electrocardiogram [ECG] [EKG]: Secondary | ICD-10-CM

## 2015-06-22 DIAGNOSIS — R079 Chest pain, unspecified: Secondary | ICD-10-CM

## 2015-06-22 DIAGNOSIS — K8689 Other specified diseases of pancreas: Secondary | ICD-10-CM

## 2015-06-22 NOTE — Patient Instructions (Addendum)
We will be checking the following labs today - NONE   Medication Instructions:    Continue with your current medicines.     Testing/Procedures To Be Arranged:  Lexiscan Myoview this week - scheduled for Wednesday - if you cannot come - please call us!  Follow-Up:   See back prn.      Other Special Instructions:   N/A  Call the [place] office at [phone] if you have any questions, problems or concerns.

## 2015-06-22 NOTE — Telephone Encounter (Signed)
Pt called requesting diclofenac patch and oxycodone to be filled.

## 2015-06-22 NOTE — Progress Notes (Signed)
CARDIOLOGY OFFICE NOTE  Date:  06/22/2015    Shelia Arnold Date of Birth: [birth year] Medical Record #[id number]  PCP:  Hinton Lovely, MD  Cardiologist:  Allred (NEW)    Chief Complaint  Patient presents with  . Pre-op Exam    New patient - seen for Dr. Rayann Heman (DOD)    History of Present Illness: Shelia Arnold is a 49 y.o. female who presents today for a pre op clearance. Seen for Dr. Rayann Heman today (DOD).   She reports a history of remote MI. Other issues include HTN, multi substance abuse with alcohol, tobacco and marijuana, depression, prior stroke and recent finding of adenocarcinoma of the pancrease.   Comes in today. Here alone. Needing surgical clearance for a whipple by Dr. Barry Dienes - scheduled for 06/25/2015. EKG is abnormal from August - anterior ischemia noted. She has not seen cardiology in many years apparently. She tells me she had a heart attack "10 or 15 years ago" - does not remember any details. Does not sound like she was cathed. Says she just stayed a few days and then went home. She endorses long standing history of chest pain - may happen a few times a month. Does not sound like it is getting worse but not any better. She is quite vague in her description. She has no spells with exertion but admits she does not really exert herself. No longer employed due to her seizure disorder. Continues to smoke - does not really quantify. Did not discuss her use of alcohol. Denies being short of breath. Not dizzy or lightheaded. Says BP was around 130 yesterday.   Past Medical History  Diagnosis Date  . Depression   . Hypertension   . Alcohol abuse   . Myocardial infarction     per pt. many years ago does not see a cardiologist  . Stroke     unsure of date no residual effects  . Seizure     had 2 seizures while in hospital 04-2015 has had seizures since baby  . Headache   . Arthritis   . 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 No past surgeries    . Ercp N/A 05/19/2015    Procedure: ENDOSCOPIC RETROGRADE CHOLANGIOPANCREATOGRAPHY (ERCP);  Surgeon: Teena Irani, MD;  Location: [facility] ENDOSCOPY;  Service: Endoscopy;  Laterality: N/A;     Medications: Current Outpatient Prescriptions  Medication Sig Dispense Refill  . amitriptyline (ELAVIL) 25 MG tablet TAKE ONE TABLET BY MOUTH AT BEDTIME 90 tablet 3  . diclofenac (FLECTOR) 1.3 % PTCH PLACE 1 PATCH ONTO THE SKIN TWICE DAILY 180 patch 3  . diclofenac sodium (VOLTAREN) 1 % GEL Apply 2 g topically 4 (four) times daily. (Patient taking differently: Apply 2 g topically 2 (two) times daily as needed (for pain). ) 100 g 5  . Emollient (GOLD BOND MEDICATED BODY) 5-0.15 % LOTN Apply 1 application topically daily as needed (dry skin).    . hydrochlorothiazide (HYDRODIURIL) 25 MG tablet Take 0.5 tablets (12.5 mg total) by mouth daily. (Patient taking differently: Take 25 mg by mouth daily. ) 90 tablet 3  . ibuprofen (ADVIL,MOTRIN) 400 MG tablet Take 1 tablet (400 mg total) by mouth every 6 (six) hours as needed for moderate pain. 90 tablet 1  . levETIRAcetam (KEPPRA) 1000 MG tablet TAKE 1 AND 1/2 TABLETS BY MOUTH EVERY 12 HOURS 270 tablet 0  .  Multiple Vitamin (MULTIVITAMIN WITH MINERALS) TABS tablet Take 1 tablet by mouth daily. 90 tablet 9  . oxyCODONE-acetaminophen (PERCOCET/ROXICET) 5-325 MG per tablet Take 1 tablet by mouth every 6 (six) hours as needed for severe pain. 30 tablet 0  . polyethylene glycol (MIRALAX / GLYCOLAX) packet Take 17 g by mouth 2 (two) times daily. 14 each 0  . potassium chloride SA (K-DUR,KLOR-CON) 20 MEQ tablet Take 1 tablet (20 mEq total) by mouth 2 (two) times daily. 60 tablet 0  . Sennosides-Docusate Sodium (SENNA) 8.6-50 MG TABS Take 1 tablet by mouth 2 (two) times daily. 7 each 0  . traMADol (ULTRAM) 50 MG tablet Take 1 tablet (50 mg total) by mouth every 6 (six) hours as needed (Please only give tramadol if  ibuprofen does not alleviate her back pain). 30 tablet 0   No current facility-administered medications for this visit.    Allergies: No Known Allergies  Social History: The patient  reports that she has been smoking Cigarettes.  She has a 4.5 pack-year smoking history. She has never used smokeless tobacco. She reports that she drinks alcohol. She reports that she does not use illicit drugs.   Family History: The patient's family history includes Cancer in her father. She tells me her mother died with childbirth. No known history of heart disease in her family.   Review of Systems: Please see the history of present illness.     All other systems are reviewed and negative.   Physical Exam: VS:  BP 150/90 mmHg  Pulse 99  Ht 5' (1.524 m)  Wt 106 lb 12.8 oz (48.444 kg)  BMI 20.86 kg/m2  LMP 12/26/2014 .  BMI Body mass index is 20.86 kg/(m^2).  Wt Readings from Last 3 Encounters:  06/22/15 106 lb 12.8 oz (48.444 kg)  06/16/15 106 lb 0.7 oz (48.1 kg)  06/03/15 105 lb 8 oz (47.854 kg)    General: Pleasant. Well developed, well nourished and in no acute distress.  HEENT: Normal. Neck: Supple, no JVD, carotid bruits, or masses noted.  Cardiac: Regular rate and rhythm. Rate is a little fast. No murmurs, rubs, or gallops. No edema.  Respiratory:  Lungs are clear to auscultation bilaterally with normal work of breathing.  GI: Soft and nontender.  MS: No deformity or atrophy. Gait and ROM intact. Skin: Warm and dry. Color is normal.  Neuro:  Strength and sensation are intact and no gross focal deficits noted.  Psych: Alert, appropriate and with normal affect.   LABORATORY DATA:  EKG:  EKG is ordered today. This demonstrates NSR today - less ST/T wave changes - more nonspecific and not as pronounced today.    Lab Results  Component Value Date   WBC 10.7* 06/16/2015   HGB 12.0 06/16/2015   HCT 36.8 06/16/2015   PLT 462* 06/16/2015   GLUCOSE 104* 06/16/2015   CHOL 639*  05/16/2015   TRIG 338* 05/16/2015   HDL <10* 05/16/2015   LDLCALC NOT CALCULATED 05/16/2015   ALT 38 06/16/2015   AST 39 06/16/2015   NA 139 06/16/2015   K 3.4* 06/16/2015   CL 103 06/16/2015   CREATININE 0.57 06/16/2015   BUN 17 06/16/2015   CO2 26 06/16/2015   TSH 0.694 10/29/2012   INR 0.98 06/16/2015   HGBA1C 5.5 10/29/2012    BNP (last 3 results) No results for input(s): BNP in the last 8760 hours.  ProBNP (last 3 results) No results for input(s): PROBNP in the last 8760 hours.  Other Studies Reviewed Today: N/A  Assessment/Plan: 1. Pre op clearance - she endorses chest pain - we have no details regarding prior MI - EKG from August markedly abnormal. More nonspecific changes noted on today's EKG. Discussed with Dr. Rayann Heman - will proceed with Lexiscan - mostly to risk stratify. If low or intermediate risk then would proceed on with her surgery - if this is high risk - we will need to have further conversations as to how to proceed. The patient is not sure if she can arrange transportation for [place] this Wednesday. May not have the results until early Thursday morning. I have alerted Dr. Marlowe Aschoff office as well.   2. 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   3. Marked HLD  4. Multi substance abuse  Current medicines are reviewed with the patient today.  The patient does not have concerns regarding medicines other than what has been noted above.  The following changes have been made:  See above.  Labs/ tests ordered today include:    Orders Placed This Encounter  Procedures  . Myocardial Perfusion Imaging  . EKG 12-Lead     Disposition:   Further disposition to follow.   Patient is agreeable to this plan and will call if any problems develop in the interim.   Signed: Burtis Junes, RN, ANP-C 06/22/2015 3:37 PM  [facility] [address] [place], [place]  [address] Phone:  [phone] Fax: [phone]

## 2015-06-22 NOTE — Telephone Encounter (Signed)
Patient given detailed instructions per Myocardial Perfusion Study Information Sheet for test on 06/24/2015 at 10:00. Patient notified to arrive 15 minutes early and that it is imperative to arrive on time for appointment to keep from having the test rescheduled.  If you need to cancel or reschedule your appointment, please call the office within 24 hours of your appointment. Failure to do so may result in a cancellation of your appointment, and a $50 no show fee. Patient verbalized understanding. EHK

## 2015-06-23 MED ORDER — DICLOFENAC EPOLAMINE 1.3 % TD PTCH: MEDICATED_PATCH | TRANSDERMAL | Status: AC

## 2015-06-23 MED ORDER — OXYCODONE-ACETAMINOPHEN 5-325 MG PO TABS: 1.0000 | ORAL_TABLET | Freq: Four times a day (QID) | ORAL | Status: DC | PRN

## 2015-06-24 ENCOUNTER — Telehealth: Payer: Self-pay | Admitting: *Deleted

## 2015-06-24 ENCOUNTER — Ambulatory Visit (HOSPITAL_BASED_OUTPATIENT_CLINIC_OR_DEPARTMENT_OTHER): Payer: Medicaid Other

## 2015-06-24 ENCOUNTER — Encounter (HOSPITAL_COMMUNITY): Payer: Medicaid Other

## 2015-06-24 DIAGNOSIS — F1910 Other psychoactive substance abuse, uncomplicated: Secondary | ICD-10-CM

## 2015-06-24 DIAGNOSIS — Z01818 Encounter for other preprocedural examination: Secondary | ICD-10-CM

## 2015-06-24 DIAGNOSIS — I259 Chronic ischemic heart disease, unspecified: Secondary | ICD-10-CM | POA: Diagnosis not present

## 2015-06-24 DIAGNOSIS — R9431 Abnormal electrocardiogram [ECG] [EKG]: Secondary | ICD-10-CM

## 2015-06-24 DIAGNOSIS — R079 Chest pain, unspecified: Secondary | ICD-10-CM

## 2015-06-24 DIAGNOSIS — I10 Essential (primary) hypertension: Secondary | ICD-10-CM | POA: Diagnosis not present

## 2015-06-24 DIAGNOSIS — R9439 Abnormal result of other cardiovascular function study: Secondary | ICD-10-CM

## 2015-06-24 LAB — MYOCARDIAL PERFUSION IMAGING
LV dias vol: 59 mL
LV sys vol: 17 mL
Peak HR: 114 {beats}/min
RATE: 0.24
Rest HR: 76 {beats}/min
SDS: 7
SRS: 5
SSS: 12
TID: 1.32

## 2015-06-24 MED ORDER — TECHNETIUM TC 99M SESTAMIBI GENERIC - CARDIOLITE
32.9000 | Freq: Once | INTRAVENOUS | Status: AC | PRN
  Administered 2015-06-24: 32.9 via INTRAVENOUS

## 2015-06-24 MED ORDER — TECHNETIUM TC 99M SESTAMIBI GENERIC - CARDIOLITE
10.2000 | Freq: Once | INTRAVENOUS | Status: AC | PRN
  Administered 2015-06-24: 10 via INTRAVENOUS

## 2015-06-24 MED ORDER — CEFAZOLIN SODIUM-DEXTROSE 2-3 GM-% IV SOLR
2.0000 g | INTRAVENOUS | Status: AC
  Administered 2015-06-25: 2 g via INTRAVENOUS
  Filled 2015-06-24: qty 50

## 2015-06-24 MED ORDER — REGADENOSON 0.4 MG/5ML IV SOLN
0.4000 mg | Freq: Once | INTRAVENOUS | Status: AC
  Administered 2015-06-24: 0.4 mg via INTRAVENOUS

## 2015-06-24 NOTE — Telephone Encounter (Signed)
Pt aware - surgery is sch 06/25/15 at [facility].

## 2015-06-24 NOTE — H&P (Signed)
Shelia Arnold 05/29/2015 8:03 AM Location: [facility] Patient #: [id number] DOB: 1965/11/26 Undefined / Language: Shelia Arnold / Race: Black or African American Female  History of Present Illness Shelia Arnold; 05/29/2015 12:03 PM) The patient is a 49 year old female who presents with a pancreatic mass. Patient is a 49 year old female who was admitted to the hospital on August 19 for jaundice. She is referred for consultation by Dr. Burr Arnold for new diagnosis of pancreatic/duodenal/ampullary carcinoma. She also had significant itching and dark-colored urine. She complained of right abdominal pain. She has had some weight loss and anorexia. She first received an ultrasound which showed a mass in the common bile duct and dilation of the common bile duct. She was seen over 3.6 cm hypervascular mass in the head of the pancreas. She had an ERCP which showed a mass invading the duodenum and a metallic stent was placed. Pathology is consistent with adenocarcinoma. We reviewed the patient at multidisciplinary cancer conference this week. Although the mass does appear to be consistent with the head of the pancreas, and it could certainly also be duodenal or ampullary. There is tumor tracking up into the common bile duct, which would be sure nearly uncommon for a primary pancreatic cancer. It would be much more common to see that with ampullary or duodenal cancer. Her bilirubin was as high as 27.6. It is down to 8.9. She does get pain when she eats.  She was confused this AM about where to go. This confusion did seem to resolve.  Images and reports are reviewed. These are also discussed with radiology and gastroenterology. Pathology slides are reviewed with the pathologist. MRI/MRCP IMPRESSION: Hypovascular mass in the pancreatic head measuring 3.6 cm, highly suspicious for pancreatic adenocarcinoma. This obstructs the distal common bile duct causing severe diffuse biliary ductal dilatation. No definite  evidence of metastatic disease within the abdomen. Cholelithiasis, without definite signs of acute cholecystitis. CT chest IMPRESSION: 1. No evidence of metastatic disease in the thorax. 2. Mild emphysema. pathology of duodenal biopsies. Duodenum, Biopsy INVASIVE ADENOCARCINOMA, SEE COMMENT.   Problem List/Past Medical Shelia Klein, Arnold; 05/29/2015 11:18 AM) ADENOCARCINOMA OF HEAD OF PANCREAS (C25.0) HYPONATREMIA (E87.1) SEIZURE DISORDER (G40.909) DEPRESSION, UNSPECIFIED DEPRESSION TYPE (F32.9) ALCOHOL ABUSE (F10.10)  Past Surgical History Shelia Klein, Arnold; 05/29/2015 11:19 AM) None09/10/2014 (Marked as Inactive) ERCP, WITH STENT PLACEMENT (43274) Shelia Arnold  Allergies Shelia Arnold; 06/02/2015 9:20 AM) No Known Drug Allergies09/02/2015  Medication History Shelia Arnold; 06/02/2015 9:20 AM) Medications Reconciled Elavil (25MG  Tablet, Oral daily) Active. Voltaren (1% Gel, Transdermal as directed) Active. Gold Bond Medicated Body (5-0.15% Lotion, External prn) Active. Ibuprofen (200MG  Tablet, Oral prn) Active. Keppra (1000MG  Tablet, Oral) Active. Multivitamin (Oral daily) Active. Potassium Chloride (20MEQ Tablet ER, Oral daily) Active. TraMADol HCl (50MG  Tablet, Oral prn) Active.  Social History Shelia Klein, Arnold; 05/29/2015 11:35 AM) Tobacco use Current every day smoker. 0.5 ppd times 15 years. Alcohol use Heavy alcohol use, Drinks beer. 20 beers per week Illicit drug use Uses marijuana.  Family History Shelia Klein, Arnold; 05/29/2015 11:35 AM) Seizure disorder Cirrhosis Of Liver Sister.  Review of Systems Shelia Arnold; 05/29/2015 11:34 AM) All other systems negative  Note: positive for anorexia, weight loss, jaundice, abdominal pain, nausea, fatigue, back pain    Vitals Shelia Arnold; 05/29/2015 11:36 AM) 05/29/2015 11:35 AM Weight: 103.2 lb Temp.: 98.64F  Pulse: 74 (Regular)  Resp.: 18 (Unlabored)  P.OX: 100% (Room air) BP: 113/74 (Sitting,  Left Arm, Standard)  Physical Exam Shelia Arnold; 05/29/2015 11:23 AM) General Mental Status-Alert. General Appearance-Consistent with stated age. Hydration-Well hydrated. Voice-Normal.  Head and Neck Head-normocephalic, atraumatic with no lesions or palpable masses. Trachea-midline. Thyroid Gland Characteristics - normal size and consistency.  Eye Eyeball - Bilateral-Extraocular movements intact. Sclera/Conjunctiva - Bilateral-Sclera icteric.  Chest and Lung Exam Chest and lung exam reveals -quiet, even and easy respiratory effort with no use of accessory muscles and on auscultation, normal breath sounds, no adventitious sounds and normal vocal resonance. Inspection Chest Wall - Normal. Back - normal.  Cardiovascular Cardiovascular examination reveals -normal heart sounds, regular rate and rhythm with no murmurs and normal pedal pulses bilaterally.  Abdomen Inspection Inspection of the abdomen reveals - No Hernias. Palpation/Percussion Palpation and Percussion of the abdomen reveal - Soft, Non Tender, No Rebound tenderness, No Rigidity (guarding) and No hepatosplenomegaly. Auscultation Auscultation of the abdomen reveals - Bowel sounds normal.  Neurologic Neurologic evaluation reveals -alert and oriented x 3 with no impairment of recent or remote memory. Mental Status-Normal.  Musculoskeletal Global Assessment -Note: no gross deformities.  Normal Exam - Left-Upper Extremity Strength Normal and Lower Extremity Strength Normal. Normal Exam - Right-Upper Extremity Strength Normal and Lower Extremity Strength Normal.  Lymphatic Head & Neck  General Head & Neck Lymphatics: Bilateral - Description - Normal. Axillary  General Axillary Region: Bilateral - Description - Normal. Tenderness - Non Tender. Femoral & Inguinal  Generalized Femoral & Inguinal Lymphatics: Bilateral - Description - No Generalized  lymphadenopathy.    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 She does not appear to have any vascular involvement on any of her imaging studies.   I discussed the surgery with the patient including diagrams of anatomy. I discussed the role of diagnostic laparoscopy. In the case of ampullary cancers, if spread of the disease is found, we will abort the procedure and not proceed with resection. The rationale for this was discussed with the patient. There has not been data to support resection of Stage IV disease in terms of survival benefit.  We discussed possible complications including: Potential of aborting procedure if tumor is invading the superior mesenteric or hepatic arteries Bleeding Infection and possible wound complications such as hernia Damage to adjacent structures Leak of anastamoses, primarily pancreatic Possible need for other procedures Possible prolonged nausea with possible need for external feeding. Possible prolonged hospital stay. Possible development of diabetes or worsening of current diabetes. Possible pancreatic exocrine insufficiency Prolonged fatigue/weakness/appetite Possible early recurrence of cancer   The patient understands and wishes to proceed. The patient has been advised to turn in disability paperwork to our office.  45 min spent in evaluation, examination, counseling, and coordination of care. >50% spent in counseling.  45 min spent in evaluation, examination, counseling, and coordination of care. Current Plans  You are being scheduled for surgery - Our schedulers will call you.  You should hear from our office's scheduling department within 5 working days about the location, date, and time of surgery. We try to make accommodations for patient's preferences in scheduling  surgery, but sometimes the OR schedule or the surgeon's schedule prevents [place] from making those accommodations.  If you have not heard from our office [phone]) in 5 working days, call the office and ask for your surgeon's nurse.  If you have other questions about your diagnosis, plan, or  surgery, call the office and ask for your surgeon's nurse. Pt Education - flb whipple pt info TOBACCO ABUSE (305.1  Z72.0) Current Plans Pt Education - Smoking: Ways to Quit: addiction TOBACCO ABUSE COUNSELING (V65.42  Z71.6) ANEMIA OF CHRONIC DISEASE (285.29  D63.8)    Signed by Shelia Klein, Arnold (06/02/2015 10:59 AM)

## 2015-06-24 NOTE — Telephone Encounter (Signed)
Pt walks in and ask what that medicine is that the clinic called and told her to take for her surgery. After asking about surgery and reviewing notes, called [facility] and spoke to a rep who told us over speaker phone that pt was to have started prep this am AT 0700, she didn't, she is to start ASAP, 4 dulcolax tablets asap then take a bottle of miralax and mix with 64oz of gatorade and drink 8oz every 15 to 30 mins til it is gone. She may have clear liquids until midnight then nothing by mouth after midnight. She is upset by this but states she will do so, it is stressed that if she does not follow the directions her surgery could be cancelled or she could cause herself problems she states she is not happy but will do so. Triage wrote the preop directions down for her in big bold letters and reviewed them with her. She states she does not have much money and was advised that [facility] both dulcolax and miralax for right at 5.00, she states it;s too far to walk and leaves.  Before she leaves she also ask for her pain med and states she will take it in the morning when she gets ready to come to the hospital, called [facility] again and the rep will speak w/ dr Barry Dienes and call her, she also wants to take her K+, they will also address that

## 2015-06-25 ENCOUNTER — Inpatient Hospital Stay (HOSPITAL_COMMUNITY): Payer: Medicaid Other | Admitting: Anesthesiology

## 2015-06-25 ENCOUNTER — Inpatient Hospital Stay (HOSPITAL_COMMUNITY): Payer: Medicaid Other | Admitting: Emergency Medicine

## 2015-06-25 ENCOUNTER — Encounter (HOSPITAL_COMMUNITY): Payer: Self-pay | Admitting: *Deleted

## 2015-06-25 ENCOUNTER — Encounter (HOSPITAL_COMMUNITY)
Admission: RE | Disposition: A | Discharge status: Home / Self Care | Payer: Self-pay | Source: Ambulatory Visit | Attending: General Surgery

## 2015-06-25 ENCOUNTER — Inpatient Hospital Stay (HOSPITAL_COMMUNITY)
Admission: RE | Admit: 2015-06-25 | Discharge: 2015-06-28 | DRG: 421 | Disposition: A | Discharge status: Home / Self Care | Payer: Medicaid Other | Source: Ambulatory Visit | Attending: General Surgery | Admitting: General Surgery

## 2015-06-25 DIAGNOSIS — I10 Essential (primary) hypertension: Secondary | ICD-10-CM | POA: Diagnosis present

## 2015-06-25 DIAGNOSIS — I252 Old myocardial infarction: Secondary | ICD-10-CM

## 2015-06-25 DIAGNOSIS — C7889 Secondary malignant neoplasm of other digestive organs: Secondary | ICD-10-CM | POA: Diagnosis present

## 2015-06-25 DIAGNOSIS — F329 Major depressive disorder, single episode, unspecified: Secondary | ICD-10-CM | POA: Diagnosis present

## 2015-06-25 DIAGNOSIS — E440 Moderate protein-calorie malnutrition: Secondary | ICD-10-CM | POA: Diagnosis present

## 2015-06-25 DIAGNOSIS — F1290 Cannabis use, unspecified, uncomplicated: Secondary | ICD-10-CM | POA: Diagnosis present

## 2015-06-25 DIAGNOSIS — C170 Malignant neoplasm of duodenum: Secondary | ICD-10-CM | POA: Diagnosis present

## 2015-06-25 DIAGNOSIS — D638 Anemia in other chronic diseases classified elsewhere: Secondary | ICD-10-CM | POA: Diagnosis present

## 2015-06-25 DIAGNOSIS — F17200 Nicotine dependence, unspecified, uncomplicated: Secondary | ICD-10-CM | POA: Diagnosis present

## 2015-06-25 DIAGNOSIS — F1010 Alcohol abuse, uncomplicated: Secondary | ICD-10-CM | POA: Diagnosis present

## 2015-06-25 DIAGNOSIS — C787 Secondary malignant neoplasm of liver and intrahepatic bile duct: Secondary | ICD-10-CM | POA: Diagnosis present

## 2015-06-25 DIAGNOSIS — G40909 Epilepsy, unspecified, not intractable, without status epilepticus: Secondary | ICD-10-CM | POA: Diagnosis present

## 2015-06-25 DIAGNOSIS — C250 Malignant neoplasm of head of pancreas: Secondary | ICD-10-CM | POA: Diagnosis not present

## 2015-06-25 DIAGNOSIS — D649 Anemia, unspecified: Secondary | ICD-10-CM | POA: Diagnosis not present

## 2015-06-25 HISTORY — PX: EXPLORATORY LAPAROTOMY: SUR591

## 2015-06-25 HISTORY — DX: Malignant neoplasm of duodenum: C17.0

## 2015-06-25 HISTORY — PX: WEDGE LIVER BIOPSY: SUR137

## 2015-06-25 HISTORY — PX: WHIPPLE PROCEDURE: SHX2667

## 2015-06-25 HISTORY — PX: LAPAROSCOPY: SHX197

## 2015-06-25 HISTORY — DX: Other chronic pain: G89.29

## 2015-06-25 HISTORY — PX: DIAGNOSTIC LAPAROSCOPY: SUR761

## 2015-06-25 HISTORY — DX: Dorsalgia, unspecified: M54.9

## 2015-06-25 LAB — POCT I-STAT 7, (LYTES, BLD GAS, ICA,H+H)
ACID-BASE DEFICIT: 3 mmol/L — AB (ref 0.0–2.0)
BICARBONATE: 22.3 meq/L (ref 20.0–24.0)
Calcium, Ion: 1.24 mmol/L — ABNORMAL HIGH (ref 1.12–1.23)
HCT: 30 % — ABNORMAL LOW (ref 36.0–46.0)
HEMOGLOBIN: 10.2 g/dL — AB (ref 12.0–15.0)
O2 SAT: 100 %
PH ART: 7.391 (ref 7.350–7.450)
PO2 ART: 483 mmHg — AB (ref 80.0–100.0)
Patient temperature: 35.1
Potassium: 2.9 mmol/L — ABNORMAL LOW (ref 3.5–5.1)
Sodium: 137 mmol/L (ref 135–145)
TCO2: 24 mmol/L (ref 0–100)
pCO2 arterial: 36.1 mmHg (ref 35.0–45.0)

## 2015-06-25 LAB — CBC
HEMATOCRIT: 36.1 % (ref 36.0–46.0)
HEMOGLOBIN: 11.7 g/dL — AB (ref 12.0–15.0)
MCH: 29.1 pg (ref 26.0–34.0)
MCHC: 32.4 g/dL (ref 30.0–36.0)
MCV: 89.8 fL (ref 78.0–100.0)
Platelets: 362 10*3/uL (ref 150–400)
RBC: 4.02 MIL/uL (ref 3.87–5.11)
RDW: 15.3 % (ref 11.5–15.5)
WBC: 11.3 10*3/uL — ABNORMAL HIGH (ref 4.0–10.5)

## 2015-06-25 LAB — CREATININE, SERUM
Creatinine, Ser: 0.65 mg/dL (ref 0.44–1.00)
GFR calc Af Amer: >60 mL/min (ref >60)

## 2015-06-25 SURGERY — LAPAROSCOPY, DIAGNOSTIC
Anesthesia: General | Site: Abdomen

## 2015-06-25 MED ORDER — KETOROLAC TROMETHAMINE 30 MG/ML IJ SOLN
INTRAMUSCULAR | Status: DC | PRN
  Administered 2015-06-25: 30 mg via INTRAVENOUS

## 2015-06-25 MED ORDER — BOOST / RESOURCE BREEZE PO LIQD
1.0000 | Freq: Three times a day (TID) | ORAL | Status: DC
  Administered 2015-06-25 – 2015-06-26 (×2): 1 via ORAL

## 2015-06-25 MED ORDER — SUGAMMADEX SODIUM 200 MG/2ML IV SOLN
INTRAVENOUS | Status: DC | PRN
  Administered 2015-06-25: 400 mg via INTRAVENOUS

## 2015-06-25 MED ORDER — DICLOFENAC SODIUM 1 % TD GEL: 2.0000 g | Freq: Two times a day (BID) | TRANSDERMAL | Status: DC | PRN

## 2015-06-25 MED ORDER — MIDAZOLAM HCL 5 MG/5ML IJ SOLN
INTRAMUSCULAR | Status: DC | PRN
  Administered 2015-06-25: 2 mg via INTRAVENOUS

## 2015-06-25 MED ORDER — ONDANSETRON HCL 4 MG/2ML IJ SOLN
INTRAMUSCULAR | Status: DC | PRN
  Administered 2015-06-25: 4 mg via INTRAVENOUS

## 2015-06-25 MED ORDER — AMITRIPTYLINE HCL 25 MG PO TABS
25.0000 mg | ORAL_TABLET | Freq: Every day | ORAL | Status: DC
  Administered 2015-06-25 – 2015-06-27 (×3): 25 mg via ORAL
  Filled 2015-06-25 (×3): qty 1

## 2015-06-25 MED ORDER — OXYCODONE-ACETAMINOPHEN 5-325 MG PO TABS
1.0000 | ORAL_TABLET | Freq: Four times a day (QID) | ORAL | Status: DC | PRN
Start: 2015-06-25 — End: 2015-06-28
  Administered 2015-06-27 – 2015-06-28 (×4): 1 via ORAL
  Filled 2015-06-25 (×4): qty 1

## 2015-06-25 MED ORDER — HYDROMORPHONE HCL 1 MG/ML IJ SOLN
INTRAMUSCULAR | Status: AC
  Administered 2015-06-25: 0.5 mg via INTRAVENOUS
  Filled 2015-06-25: qty 1

## 2015-06-25 MED ORDER — GOLD BOND MEDICATED BODY 5-0.15 % EX LOTN: 1.0000 "application " | TOPICAL_LOTION | Freq: Every day | CUTANEOUS | Status: DC | PRN

## 2015-06-25 MED ORDER — FENTANYL CITRATE (PF) 100 MCG/2ML IJ SOLN
INTRAMUSCULAR | Status: DC | PRN
  Administered 2015-06-25 (×4): 50 ug via INTRAVENOUS
  Administered 2015-06-25: 100 ug via INTRAVENOUS
  Administered 2015-06-25 (×3): 50 ug via INTRAVENOUS

## 2015-06-25 MED ORDER — SUCCINYLCHOLINE CHLORIDE 20 MG/ML IJ SOLN
INTRAMUSCULAR | Status: DC | PRN
  Administered 2015-06-25: 120 mg via INTRAVENOUS

## 2015-06-25 MED ORDER — ONDANSETRON HCL 4 MG/2ML IJ SOLN: 4.0000 mg | Freq: Four times a day (QID) | INTRAMUSCULAR | Status: DC | PRN

## 2015-06-25 MED ORDER — HYDROMORPHONE 0.3 MG/ML IV SOLN
INTRAVENOUS | Status: DC
  Administered 2015-06-25: 0.3 mg via INTRAVENOUS
  Administered 2015-06-25: 0.3 mL via INTRAVENOUS
  Administered 2015-06-26: 0 mg via INTRAVENOUS
  Administered 2015-06-26: 0.9 mg via INTRAVENOUS

## 2015-06-25 MED ORDER — SUGAMMADEX SODIUM 500 MG/5ML IV SOLN
INTRAVENOUS | Status: AC
  Filled 2015-06-25: qty 5

## 2015-06-25 MED ORDER — LIDOCAINE HCL 1 % IJ SOLN
INTRAMUSCULAR | Status: DC | PRN
  Administered 2015-06-25: 14 mL

## 2015-06-25 MED ORDER — DICLOFENAC EPOLAMINE 1.3 % TD PTCH
1.0000 | MEDICATED_PATCH | Freq: Two times a day (BID) | TRANSDERMAL | Status: DC
  Administered 2015-06-25 – 2015-06-27 (×5): 1 via TRANSDERMAL
  Filled 2015-06-25 (×7): qty 1

## 2015-06-25 MED ORDER — PHENYLEPHRINE 40 MCG/ML (10ML) SYRINGE FOR IV PUSH (FOR BLOOD PRESSURE SUPPORT)
PREFILLED_SYRINGE | INTRAVENOUS | Status: AC
  Filled 2015-06-25: qty 20

## 2015-06-25 MED ORDER — IBUPROFEN 400 MG PO TABS: 400.0000 mg | ORAL_TABLET | Freq: Four times a day (QID) | ORAL | Status: DC | PRN

## 2015-06-25 MED ORDER — ONDANSETRON 4 MG PO TBDP: 4.0000 mg | ORAL_TABLET | Freq: Four times a day (QID) | ORAL | Status: DC | PRN

## 2015-06-25 MED ORDER — LEVETIRACETAM 750 MG PO TABS
750.0000 mg | ORAL_TABLET | Freq: Two times a day (BID) | ORAL | Status: DC
  Administered 2015-06-26 – 2015-06-27 (×4): 750 mg via ORAL
  Filled 2015-06-25 (×7): qty 1

## 2015-06-25 MED ORDER — KCL IN DEXTROSE-NACL 20-5-0.45 MEQ/L-%-% IV SOLN
INTRAVENOUS | Status: DC
  Administered 2015-06-25 – 2015-06-27 (×4): via INTRAVENOUS
  Filled 2015-06-25 (×6): qty 1000

## 2015-06-25 MED ORDER — SENNOSIDES-DOCUSATE SODIUM 8.6-50 MG PO TABS
1.0000 | ORAL_TABLET | Freq: Two times a day (BID) | ORAL | Status: DC
  Administered 2015-06-26 – 2015-06-27 (×2): 1 via ORAL
  Filled 2015-06-25 (×10): qty 1

## 2015-06-25 MED ORDER — LACTATED RINGERS IV SOLN
INTRAVENOUS | Status: DC | PRN
  Administered 2015-06-25 (×2): via INTRAVENOUS

## 2015-06-25 MED ORDER — PHENYLEPHRINE HCL 10 MG/ML IJ SOLN
10.0000 mg | INTRAVENOUS | Status: DC | PRN
  Administered 2015-06-25: 50 ug/min via INTRAVENOUS
  Administered 2015-06-25: 30 ug/min via INTRAVENOUS

## 2015-06-25 MED ORDER — POTASSIUM CHLORIDE CRYS ER 20 MEQ PO TBCR
20.0000 meq | EXTENDED_RELEASE_TABLET | Freq: Two times a day (BID) | ORAL | Status: DC
  Administered 2015-06-25 – 2015-06-27 (×6): 20 meq via ORAL
  Filled 2015-06-25 (×7): qty 1

## 2015-06-25 MED ORDER — ACETAMINOPHEN 650 MG RE SUPP: 650.0000 mg | Freq: Four times a day (QID) | RECTAL | Status: DC | PRN

## 2015-06-25 MED ORDER — SODIUM CHLORIDE 0.9 % IJ SOLN: 9.0000 mL | INTRAMUSCULAR | Status: DC | PRN

## 2015-06-25 MED ORDER — 0.9 % SODIUM CHLORIDE (POUR BTL) OPTIME
TOPICAL | Status: DC | PRN
  Administered 2015-06-25 (×2): 1000 mL

## 2015-06-25 MED ORDER — ACETAMINOPHEN 10 MG/ML IV SOLN
INTRAVENOUS | Status: AC
  Filled 2015-06-25: qty 100

## 2015-06-25 MED ORDER — SODIUM CHLORIDE 0.9 % IJ SOLN
INTRAMUSCULAR | Status: AC
  Filled 2015-06-25: qty 10

## 2015-06-25 MED ORDER — ONDANSETRON HCL 4 MG/2ML IJ SOLN
INTRAMUSCULAR | Status: AC
  Filled 2015-06-25: qty 2

## 2015-06-25 MED ORDER — ACETAMINOPHEN 10 MG/ML IV SOLN
INTRAVENOUS | Status: DC | PRN
  Administered 2015-06-25: 1000 mg via INTRAVENOUS

## 2015-06-25 MED ORDER — PHENYLEPHRINE HCL 10 MG/ML IJ SOLN
INTRAMUSCULAR | Status: DC | PRN
  Administered 2015-06-25 (×2): 80 ug via INTRAVENOUS
  Administered 2015-06-25: 40 ug via INTRAVENOUS
  Administered 2015-06-25: 80 ug via INTRAVENOUS

## 2015-06-25 MED ORDER — SUCCINYLCHOLINE CHLORIDE 20 MG/ML IJ SOLN
INTRAMUSCULAR | Status: AC
  Filled 2015-06-25: qty 1

## 2015-06-25 MED ORDER — LIDOCAINE HCL (CARDIAC) 20 MG/ML IV SOLN
INTRAVENOUS | Status: DC | PRN
  Administered 2015-06-25: 60 mg via INTRAVENOUS

## 2015-06-25 MED ORDER — DIPHENHYDRAMINE HCL 50 MG/ML IJ SOLN: 12.5000 mg | Freq: Four times a day (QID) | INTRAMUSCULAR | Status: DC | PRN

## 2015-06-25 MED ORDER — POLYETHYLENE GLYCOL 3350 17 G PO PACK
17.0000 g | PACK | Freq: Two times a day (BID) | ORAL | Status: DC
  Filled 2015-06-25 (×3): qty 1

## 2015-06-25 MED ORDER — NALOXONE HCL 0.4 MG/ML IJ SOLN: 0.4000 mg | INTRAMUSCULAR | Status: DC | PRN

## 2015-06-25 MED ORDER — BUPIVACAINE 0.25 % ON-Q PUMP DUAL CATH 300 ML
300.0000 mL | INJECTION | Status: DC
  Administered 2015-06-25: 300 mL
  Filled 2015-06-25: qty 300

## 2015-06-25 MED ORDER — KETOROLAC TROMETHAMINE 15 MG/ML IJ SOLN
15.0000 mg | Freq: Four times a day (QID) | INTRAMUSCULAR | Status: AC
  Administered 2015-06-25 – 2015-06-26 (×3): 15 mg via INTRAVENOUS
  Filled 2015-06-25 (×4): qty 1

## 2015-06-25 MED ORDER — HYDROMORPHONE 0.3 MG/ML IV SOLN
INTRAVENOUS | Status: AC
  Filled 2015-06-25: qty 25

## 2015-06-25 MED ORDER — PROPOFOL 10 MG/ML IV BOLUS
INTRAVENOUS | Status: AC
  Filled 2015-06-25: qty 20

## 2015-06-25 MED ORDER — MIDAZOLAM HCL 2 MG/2ML IJ SOLN
INTRAMUSCULAR | Status: AC
  Filled 2015-06-25: qty 4

## 2015-06-25 MED ORDER — PROPOFOL 10 MG/ML IV BOLUS
INTRAVENOUS | Status: DC | PRN
  Administered 2015-06-25: 150 mg via INTRAVENOUS

## 2015-06-25 MED ORDER — HYDROMORPHONE HCL 1 MG/ML IJ SOLN
0.2500 mg | INTRAMUSCULAR | Status: DC | PRN
  Administered 2015-06-25 (×4): 0.5 mg via INTRAVENOUS

## 2015-06-25 MED ORDER — CEFAZOLIN SODIUM-DEXTROSE 2-3 GM-% IV SOLR
2.0000 g | Freq: Three times a day (TID) | INTRAVENOUS | Status: AC
  Administered 2015-06-25: 2 g via INTRAVENOUS
  Filled 2015-06-25: qty 50

## 2015-06-25 MED ORDER — METHOCARBAMOL 500 MG PO TABS
500.0000 mg | ORAL_TABLET | Freq: Four times a day (QID) | ORAL | Status: DC | PRN
  Administered 2015-06-26 – 2015-06-28 (×5): 500 mg via ORAL
  Filled 2015-06-25 (×5): qty 1

## 2015-06-25 MED ORDER — KETOROLAC TROMETHAMINE 30 MG/ML IJ SOLN
INTRAMUSCULAR | Status: AC
  Filled 2015-06-25: qty 1

## 2015-06-25 MED ORDER — ENOXAPARIN SODIUM 40 MG/0.4ML ~~LOC~~ SOLN
40.0000 mg | SUBCUTANEOUS | Status: DC
  Administered 2015-06-26 – 2015-06-27 (×2): 40 mg via SUBCUTANEOUS
  Filled 2015-06-25 (×3): qty 0.4

## 2015-06-25 MED ORDER — DIPHENHYDRAMINE HCL 12.5 MG/5ML PO ELIX: 12.5000 mg | ORAL_SOLUTION | Freq: Four times a day (QID) | ORAL | Status: DC | PRN

## 2015-06-25 MED ORDER — FENTANYL CITRATE (PF) 250 MCG/5ML IJ SOLN
INTRAMUSCULAR | Status: AC
  Filled 2015-06-25: qty 5

## 2015-06-25 MED ORDER — LEVETIRACETAM 750 MG PO TABS
1500.0000 mg | ORAL_TABLET | Freq: Once | ORAL | Status: AC
  Administered 2015-06-25: 1500 mg via ORAL
  Filled 2015-06-25 (×2): qty 2

## 2015-06-25 MED ORDER — ROCURONIUM BROMIDE 50 MG/5ML IV SOLN
INTRAVENOUS | Status: AC
  Filled 2015-06-25: qty 1

## 2015-06-25 MED ORDER — LACTATED RINGERS IV SOLN
INTRAVENOUS | Status: DC | PRN
Start: 2015-06-25 — End: 2015-06-25
  Administered 2015-06-25: 08:00:00 via INTRAVENOUS

## 2015-06-25 MED ORDER — ESMOLOL HCL 10 MG/ML IV SOLN
INTRAVENOUS | Status: DC | PRN
  Administered 2015-06-25: 20 mg via INTRAVENOUS

## 2015-06-25 MED ORDER — ACETAMINOPHEN 325 MG PO TABS: 650.0000 mg | ORAL_TABLET | Freq: Four times a day (QID) | ORAL | Status: DC | PRN

## 2015-06-25 MED ORDER — ROCURONIUM BROMIDE 100 MG/10ML IV SOLN
INTRAVENOUS | Status: DC | PRN
  Administered 2015-06-25: 20 mg via INTRAVENOUS
  Administered 2015-06-25: 30 mg via INTRAVENOUS

## 2015-06-25 MED ORDER — LIDOCAINE HCL (PF) 1 % IJ SOLN
INTRAMUSCULAR | Status: AC
  Filled 2015-06-25: qty 30

## 2015-06-25 MED ORDER — KETOROLAC TROMETHAMINE 15 MG/ML IJ SOLN
15.0000 mg | Freq: Four times a day (QID) | INTRAMUSCULAR | Status: DC | PRN
  Administered 2015-06-27 – 2015-06-28 (×3): 15 mg via INTRAVENOUS
  Filled 2015-06-25 (×4): qty 1

## 2015-06-25 MED ORDER — GLYCOPYRROLATE 0.2 MG/ML IJ SOLN
INTRAMUSCULAR | Status: AC
  Filled 2015-06-25: qty 1

## 2015-06-25 MED ORDER — LIDOCAINE HCL (CARDIAC) 20 MG/ML IV SOLN
INTRAVENOUS | Status: AC
  Filled 2015-06-25: qty 5

## 2015-06-25 MED ORDER — BUPIVACAINE ON-Q PAIN PUMP (FOR ORDER SET NO CHG)
INJECTION | Status: DC
  Filled 2015-06-25: qty 1

## 2015-06-25 MED ORDER — EPHEDRINE SULFATE 50 MG/ML IJ SOLN
INTRAMUSCULAR | Status: AC
Start: 2015-06-25 — End: 2015-06-25
  Filled 2015-06-25: qty 1

## 2015-06-25 MED ORDER — HYDROCHLOROTHIAZIDE 25 MG PO TABS
25.0000 mg | ORAL_TABLET | Freq: Every day | ORAL | Status: DC
  Administered 2015-06-25 – 2015-06-27 (×3): 25 mg via ORAL
  Filled 2015-06-25 (×4): qty 1

## 2015-06-25 MED ORDER — SIMETHICONE 80 MG PO CHEW: 40.0000 mg | CHEWABLE_TABLET | Freq: Four times a day (QID) | ORAL | Status: DC | PRN

## 2015-06-25 MED ORDER — TRAMADOL HCL 50 MG PO TABS: 50.0000 mg | ORAL_TABLET | Freq: Four times a day (QID) | ORAL | Status: DC | PRN

## 2015-06-25 MED ORDER — BUPIVACAINE-EPINEPHRINE (PF) 0.25% -1:200000 IJ SOLN
INTRAMUSCULAR | Status: AC
  Filled 2015-06-25: qty 30

## 2015-06-25 MED ORDER — PROMETHAZINE HCL 25 MG/ML IJ SOLN: 6.2500 mg | INTRAMUSCULAR | Status: DC | PRN

## 2015-06-25 SURGICAL SUPPLY — 113 items
BLADE SURG ROTATE 9660 (MISCELLANEOUS) ×2 IMPLANT
BOOT SUTURE AID YELLOW STND (SUTURE) ×4 IMPLANT
CANISTER SUCTION 2500CC (MISCELLANEOUS) ×2 IMPLANT
CATH KIT ON Q 7.5IN SLV (PAIN MANAGEMENT) ×4 IMPLANT
CATH ROBINSON RED A/P 16FR (CATHETERS) IMPLANT
CHLORAPREP W/TINT 26ML (MISCELLANEOUS) ×2 IMPLANT
CLIP LIGATING HEM O LOK PURPLE (MISCELLANEOUS) ×4 IMPLANT
CLIP LIGATING HEMO O LOK GREEN (MISCELLANEOUS) ×4 IMPLANT
CLIP LIGATING HEMOLOK MED (MISCELLANEOUS) ×4 IMPLANT
CLIP TI LARGE 6 (CLIP) ×4 IMPLANT
CLIP TI MEDIUM 24 (CLIP) ×4 IMPLANT
CONT SPEC 4OZ CLIKSEAL STRL BL (MISCELLANEOUS) ×2 IMPLANT
COVER MAYO STAND STRL (DRAPES) ×2 IMPLANT
COVER SURGICAL LIGHT HANDLE (MISCELLANEOUS) ×2 IMPLANT
DECANTER SPIKE VIAL GLASS SM (MISCELLANEOUS) ×4 IMPLANT
DERMABOND ADVANCED (GAUZE/BANDAGES/DRESSINGS) ×1
DERMABOND ADVANCED .7 DNX12 (GAUZE/BANDAGES/DRESSINGS) ×1 IMPLANT
DRAIN CHANNEL 19F RND (DRAIN) ×4 IMPLANT
DRAIN PENROSE 1/2X36 STERILE (WOUND CARE) IMPLANT
DRAPE LAPAROSCOPIC ABDOMINAL (DRAPES) ×2 IMPLANT
DRAPE WARM FLUID 44X44 (DRAPE) ×2 IMPLANT
DRSG COVADERM 4X10 (GAUZE/BANDAGES/DRESSINGS) ×2 IMPLANT
DRSG COVADERM 4X14 (GAUZE/BANDAGES/DRESSINGS) IMPLANT
DRSG COVADERM 4X6 (GAUZE/BANDAGES/DRESSINGS) IMPLANT
DRSG COVADERM 4X8 (GAUZE/BANDAGES/DRESSINGS) IMPLANT
DRSG TEGADERM 4X4.75 (GAUZE/BANDAGES/DRESSINGS) ×4 IMPLANT
DRSG TELFA 3X8 NADH (GAUZE/BANDAGES/DRESSINGS) ×2 IMPLANT
ELECT BLADE 4.0 EZ CLEAN MEGAD (MISCELLANEOUS) ×2
ELECT BLADE 6.5 EXT (BLADE) ×2 IMPLANT
ELECT CAUTERY BLADE 6.4 (BLADE) ×2 IMPLANT
ELECT REM PT RETURN 9FT ADLT (ELECTROSURGICAL) ×2
ELECTRODE BLDE 4.0 EZ CLN MEGD (MISCELLANEOUS) ×1 IMPLANT
ELECTRODE REM PT RTRN 9FT ADLT (ELECTROSURGICAL) ×1 IMPLANT
EVACUATOR SILICONE 100CC (DRAIN) ×4 IMPLANT
GAUZE SPONGE 4X4 12PLY STRL (GAUZE/BANDAGES/DRESSINGS) IMPLANT
GAUZE SPONGE 4X4 16PLY XRAY LF (GAUZE/BANDAGES/DRESSINGS) IMPLANT
GLOVE BIO SURGEON STRL SZ 6 (GLOVE) ×4 IMPLANT
GLOVE BIO SURGEON STRL SZ7 (GLOVE) ×4 IMPLANT
GLOVE BIOGEL PI IND STRL 6.5 (GLOVE) ×1 IMPLANT
GLOVE BIOGEL PI IND STRL 7.0 (GLOVE) ×2 IMPLANT
GLOVE BIOGEL PI IND STRL 8 (GLOVE) ×1 IMPLANT
GLOVE BIOGEL PI INDICATOR 6.5 (GLOVE) ×1
GLOVE BIOGEL PI INDICATOR 7.0 (GLOVE) ×2
GLOVE BIOGEL PI INDICATOR 8 (GLOVE) ×1
GLOVE ECLIPSE 8.0 STRL XLNG CF (GLOVE) ×2 IMPLANT
GLOVE INDICATOR 6.5 STRL GRN (GLOVE) ×4 IMPLANT
GOWN STRL REUS W/ TWL LRG LVL3 (GOWN DISPOSABLE) ×3 IMPLANT
GOWN STRL REUS W/TWL 2XL LVL3 (GOWN DISPOSABLE) ×4 IMPLANT
GOWN STRL REUS W/TWL LRG LVL3 (GOWN DISPOSABLE) ×3
HEMOSTAT SURGICEL 2X14 (HEMOSTASIS) IMPLANT
KIT BASIN OR (CUSTOM PROCEDURE TRAY) ×2 IMPLANT
KIT ROOM TURNOVER OR (KITS) ×2 IMPLANT
KIT TOURNIQUET VASCULAR (KITS) IMPLANT
LIQUID BAND (GAUZE/BANDAGES/DRESSINGS) IMPLANT
LOOP VESSEL MAXI BLUE (MISCELLANEOUS) ×2 IMPLANT
NEEDLE BIOPSY 14X6 SOFT TISS (NEEDLE) IMPLANT
NS IRRIG 1000ML POUR BTL (IV SOLUTION) ×4 IMPLANT
PACK GENERAL/GYN (CUSTOM PROCEDURE TRAY) ×2 IMPLANT
PAD ARMBOARD 7.5X6 YLW CONV (MISCELLANEOUS) ×4 IMPLANT
PAD SHARPS MAGNETIC DISPOSAL (MISCELLANEOUS) IMPLANT
PLUG CATH AND CAP STER (CATHETERS) IMPLANT
RELOAD PROXIMATE 75MM BLUE (ENDOMECHANICALS) IMPLANT
SCISSORS LAP 5X35 DISP (ENDOMECHANICALS) IMPLANT
SEPRAFILM PROCEDURAL PACK 3X5 (MISCELLANEOUS) IMPLANT
SET IRRIG TUBING LAPAROSCOPIC (IRRIGATION / IRRIGATOR) IMPLANT
SHEARS FOC LG CVD HARMONIC 17C (MISCELLANEOUS) ×2 IMPLANT
SLEEVE ENDOPATH XCEL 5M (ENDOMECHANICALS) ×2 IMPLANT
SPONGE INTESTINAL PEANUT (DISPOSABLE) IMPLANT
SPONGE LAP 18X18 X RAY DECT (DISPOSABLE) ×4 IMPLANT
SPONGE SURGIFOAM ABS GEL 100 (HEMOSTASIS) IMPLANT
STAPLER PROXIMATE 75MM BLUE (STAPLE) IMPLANT
STAPLER VISISTAT 35W (STAPLE) IMPLANT
STRIP CLOSURE SKIN 1/2X4 (GAUZE/BANDAGES/DRESSINGS) ×2 IMPLANT
SUCTION POOLE TIP (SUCTIONS) ×2 IMPLANT
SUT 5.0 PDS RB-1 (SUTURE)
SUT ETHILON 2 0 FS 18 (SUTURE) IMPLANT
SUT ETHILON 2 LR (SUTURE) IMPLANT
SUT MNCRL AB 4-0 PS2 18 (SUTURE) ×2 IMPLANT
SUT PDS AB 1 TP1 96 (SUTURE) ×4 IMPLANT
SUT PDS AB 3-0 SH 27 (SUTURE) ×4 IMPLANT
SUT PDS AB 4-0 RB1 27 (SUTURE) ×8 IMPLANT
SUT PDS PLUS AB 5-0 RB-1 (SUTURE) IMPLANT
SUT PROLENE 3 0 SH 48 (SUTURE) ×8 IMPLANT
SUT PROLENE 4 0 RB 1 (SUTURE) ×2
SUT PROLENE 4 0 SH DA (SUTURE) ×4 IMPLANT
SUT PROLENE 4-0 RB1 .5 CRCL 36 (SUTURE) ×2 IMPLANT
SUT PROLENE 5 0 RB 1 DA (SUTURE) ×4 IMPLANT
SUT SILK 2 0 SH CR/8 (SUTURE) ×2 IMPLANT
SUT SILK 2 0 TIES 10X30 (SUTURE) ×2 IMPLANT
SUT SILK 3 0 SH CR/8 (SUTURE) ×2 IMPLANT
SUT SILK 3 0 TIES 10X30 (SUTURE) ×2 IMPLANT
SUT VIC AB 2-0 CT1 27 (SUTURE)
SUT VIC AB 2-0 CT1 TAPERPNT 27 (SUTURE) IMPLANT
SUT VIC AB 2-0 SH 18 (SUTURE) ×2 IMPLANT
SUT VIC AB 3-0 SH 18 (SUTURE) ×2 IMPLANT
SUT VIC AB 3-0 SH 27 (SUTURE) ×1
SUT VIC AB 3-0 SH 27X BRD (SUTURE) ×1 IMPLANT
SUT VIC AB 3-0 SH 8-18 (SUTURE) ×2 IMPLANT
SUT VIC AB 4-0 RB1 18 (SUTURE) ×2 IMPLANT
SUT VICRYL AB 2 0 TIES (SUTURE) IMPLANT
TAPE UMBILICAL 1/8 X36 TWILL (MISCELLANEOUS) IMPLANT
TOWEL OR 17X24 6PK STRL BLUE (TOWEL DISPOSABLE) ×2 IMPLANT
TOWEL OR 17X26 10 PK STRL BLUE (TOWEL DISPOSABLE) ×2 IMPLANT
TRAY FOLEY CATH 14FRSI W/METER (CATHETERS) ×2 IMPLANT
TRAY LAPAROSCOPIC MC (CUSTOM PROCEDURE TRAY) ×2 IMPLANT
TROCAR XCEL BLUNT TIP 100MML (ENDOMECHANICALS) IMPLANT
TROCAR XCEL NON-BLD 11X100MML (ENDOMECHANICALS) ×2 IMPLANT
TROCAR XCEL NON-BLD 5MMX100MML (ENDOMECHANICALS) ×2 IMPLANT
TUBE FEEDING 5FR 15 INCH (TUBING) IMPLANT
TUBE FEEDING 8FR 16IN STR KANG (MISCELLANEOUS) IMPLANT
TUBING INSUFFLATION (TUBING) ×2 IMPLANT
TUNNELER SHEATH ON-Q 16GX12 DP (PAIN MANAGEMENT) ×2 IMPLANT
WATER STERILE IRR 1000ML POUR (IV SOLUTION) IMPLANT

## 2015-06-25 NOTE — Transfer of Care (Signed)
Immediate Anesthesia Transfer of Care Note  Patient: Shelia Arnold  Procedure(s) Performed: Procedure(s): LAPAROSCOPY DIAGNOSTIC (N/A) POSSIBLE WHIPPLE PROCEDURE (N/A)  Patient Location: PACU  Anesthesia Type:General  Level of Consciousness: awake, alert  and oriented  Airway & Oxygen Therapy: Patient Spontanous Breathing and Patient connected to nasal cannula oxygen  Post-op Assessment: Report given to RN and Post -op Vital signs reviewed and stable  Post vital signs: Reviewed and stable  Last Vitals:  Filed Vitals:   06/25/15 0548  BP: 142/100  Pulse: 91  Temp: 36.9 C  Resp: 18    Complications: No apparent anesthesia complications

## 2015-06-25 NOTE — Op Note (Signed)
PRE-OPERATIVE DIAGNOSIS: duodenal cancer  POST-OPERATIVE DIAGNOSIS:  Stage IV duodenal cancer  PROCEDURE:  Procedure(s): Diagnostic laparoscopy, exploratory laparotomy, liver wedge resection  SURGEON:  Surgeon(s): Stark Klein, MD  ANESTHESIA:   general  DRAINS: none   LOCAL MEDICATIONS USED:  BUPIVICAINE  and LIDOCAINE   SPECIMEN:  Source of Specimen:  liver lesions  DISPOSITION OF SPECIMEN:  PATHOLOGY  COUNTS:  YES  DICTATION: .Dragon Dictation  PLAN OF CARE: Admit to inpatient   PATIENT DISPOSITION:  PACU - hemodynamically stable.  FINDINGS:  6-8 liver lesions just below liver capsule  EBL: min  PROCEDURE:  Patient was identified in the holding area and taken to the operating room where she was placed supine on operating room table. General anesthesia was induced. A Foley catheter was placed as well as an arterial line. Her arms were tucked. Her abdomen was then prepped and draped in sterile fashion.  Timeout was performed according to the surgical safety checklist. When all was correct, we continued.  The patient was placed into reverse Trendelenburg position and rotated to the right. A 5 mm Optiview trocar was used to access the abdomen under direct visualization after administration of local anesthetic. Pneumoperitoneum was achieved to a pressure of 15 mmHg. A second trocar was placed in the midline. The peritoneum was examined. The liver was also examined. There were no superficial lesions seen other than one small soft lesion that appeared to be a hamartoma.  A midline incision above the umbilicus was then made. The liver was palpated and there were numerous firm lesions that were later the capsule of the liver and not able to be visualized.  One that was most prominent in the lateral right liver was scored with a cautery and elevated and removed with a wedge resection. Approximately 4 other lesions were also taken in this way. The specimens were all pooled and sent for  frozen.  Unfortunately, the frozen did return positive for adenocarcinoma. On-Q catheters were placed in the preperitoneal space. The skin was then irrigated and closed with 3-0 interrupted Vicryl deep dermal sutures and running 4-0 Monocryl subcuticular sutures. The incision was then cleaned, dried, and dressed with Dermabond and an island dressing.   Needle, sponge, and instrument counts are correct times 2.

## 2015-06-25 NOTE — Anesthesia Preprocedure Evaluation (Addendum)
Anesthesia Evaluation  Patient identified by MRN, date of birth, ID band Patient awake    Reviewed: Allergy & Precautions, NPO status , Patient's Chart, lab work & pertinent test results  History of Anesthesia Complications Negative for: history of anesthetic complications  Airway Mallampati: II  TM Distance: >3 FB Neck ROM: Full    Dental  (+) Teeth Intact, Dental Advisory Given   Pulmonary Current Smoker,    Pulmonary exam normal        Cardiovascular hypertension, + Past MI  Normal cardiovascular exam  Myocardial Perfusion study     The left ventricular ejection fraction is hyperdynamic (>65%).     Nuclear stress EF: 71%.     Defect 1: There is a small defect of mild severity present in the mid anterior and mid anteroseptal location.     The study is normal.     This is a low risk study.    Neuro/Psych Seizures -, Well Controlled,  PSYCHIATRIC DISORDERS Depression CVA, No Residual Symptoms    GI/Hepatic negative GI ROS, Neg liver ROS,   Endo/Other  negative endocrine ROS  Renal/GU negative Renal ROS     Musculoskeletal   Abdominal   Peds  Hematology   Anesthesia Other Findings   Reproductive/Obstetrics                           Anesthesia Physical Anesthesia Plan  ASA: III  Anesthesia Plan: General   Post-op Pain Management:    Induction: Intravenous  Airway Management Planned: Oral ETT  Additional Equipment:   Intra-op Plan:   Post-operative Plan: Post-operative intubation/ventilation  Informed Consent: I have reviewed the patients History and Physical, chart, labs and discussed the procedure including the risks, benefits and alternatives for the proposed anesthesia with the patient or authorized representative who has indicated his/her understanding and acceptance.   Dental advisory given  Plan Discussed with: CRNA, Anesthesiologist and Surgeon  Anesthesia Plan  Comments:        Anesthesia Quick Evaluation

## 2015-06-25 NOTE — Anesthesia Postprocedure Evaluation (Signed)
Anesthesia Post Note  Patient: Shelia Arnold  Procedure(s) Performed: Procedure(s) (LRB): LAPAROSCOPY DIAGNOSTIC (N/A) LIVER BIOPSY (N/A)  Anesthesia type: general  Patient location: PACU  Post pain: Pain level controlled  Post assessment: Patient's Cardiovascular Status Stable  Last Vitals:  Filed Vitals:   06/25/15 1045  BP: 130/78  Pulse: 68  Temp:   Resp: 13    Post vital signs: Reviewed and stable  Level of consciousness: sedated  Complications: No apparent anesthesia complications

## 2015-06-25 NOTE — Anesthesia Procedure Notes (Signed)
Procedure Name: Intubation Date/Time: 06/25/2015 7:40 AM Performed by: Susa Loffler Pre-anesthesia Checklist: Patient identified, Emergency Drugs available, Suction available, Patient being monitored and Timeout performed Patient Re-evaluated:Patient Re-evaluated prior to inductionOxygen Delivery Method: Circle system utilized Preoxygenation: Pre-oxygenation with 100% oxygen Intubation Type: IV induction Ventilation: Mask ventilation without difficulty Laryngoscope Size: Mac and 3 Grade View: Grade I Tube type: Subglottic suction tube Tube size: 7.0 mm Number of attempts: 1 Airway Equipment and Method: Stylet Placement Confirmation: ETT inserted through vocal cords under direct vision,  positive ETCO2 and breath sounds checked- equal and bilateral Secured at: 22 cm Tube secured with: Tape Dental Injury: Teeth and Oropharynx as per pre-operative assessment  Comments: Procedure performed by Berna Spare, SRNA

## 2015-06-25 NOTE — Interval H&P Note (Signed)
History and Physical Interval Note:  06/25/2015 7:27 AM  Shelia Arnold  has presented today for surgery, with the diagnosis of DUODENAL CANCER  The various methods of treatment have been discussed with the patient and family. After consideration of risks, benefits and other options for treatment, the patient has consented to  Procedure(s): LAPAROSCOPY DIAGNOSTIC (N/A) POSSIBLE WHIPPLE PROCEDURE (N/A) as a surgical intervention .  The patient's history has been reviewed, patient examined, no change in status, stable for surgery.  I have reviewed the patient's chart and labs.  Questions were answered to the patient's satisfaction.     BYERLY,FAERA

## 2015-06-26 ENCOUNTER — Encounter (HOSPITAL_COMMUNITY): Payer: Self-pay | Admitting: General Surgery

## 2015-06-26 DIAGNOSIS — D649 Anemia, unspecified: Secondary | ICD-10-CM

## 2015-06-26 DIAGNOSIS — C250 Malignant neoplasm of head of pancreas: Secondary | ICD-10-CM

## 2015-06-26 DIAGNOSIS — C787 Secondary malignant neoplasm of liver and intrahepatic bile duct: Principal | ICD-10-CM

## 2015-06-26 LAB — COMPREHENSIVE METABOLIC PANEL
ALT: 54 U/L (ref 14–54)
AST: 95 U/L — ABNORMAL HIGH (ref 15–41)
Albumin: 2.8 g/dL — ABNORMAL LOW (ref 3.5–5.0)
Alkaline Phosphatase: 102 U/L (ref 38–126)
Anion gap: 8 (ref 5–15)
BUN: 5 mg/dL — ABNORMAL LOW (ref 6–20)
CHLORIDE: 106 mmol/L (ref 101–111)
CO2: 22 mmol/L (ref 22–32)
Calcium: 8.9 mg/dL (ref 8.9–10.3)
Creatinine, Ser: 0.58 mg/dL (ref 0.44–1.00)
Glucose, Bld: 96 mg/dL (ref 65–99)
POTASSIUM: 4.6 mmol/L (ref 3.5–5.1)
Sodium: 136 mmol/L (ref 135–145)
Total Bilirubin: 1.8 mg/dL — ABNORMAL HIGH (ref 0.3–1.2)
Total Protein: 5.4 g/dL — ABNORMAL LOW (ref 6.5–8.1)

## 2015-06-26 LAB — CBC
HCT: 31.5 % — ABNORMAL LOW (ref 36.0–46.0)
Hemoglobin: 10.3 g/dL — ABNORMAL LOW (ref 12.0–15.0)
MCH: 30.2 pg (ref 26.0–34.0)
MCHC: 32.7 g/dL (ref 30.0–36.0)
MCV: 92.4 fL (ref 78.0–100.0)
PLATELETS: 322 10*3/uL (ref 150–400)
RBC: 3.41 MIL/uL — ABNORMAL LOW (ref 3.87–5.11)
RDW: 15.5 % (ref 11.5–15.5)
WBC: 7 10*3/uL (ref 4.0–10.5)

## 2015-06-26 LAB — PHOSPHORUS: PHOSPHORUS: 4.7 mg/dL — AB (ref 2.5–4.6)

## 2015-06-26 LAB — MAGNESIUM: MAGNESIUM: 1.6 mg/dL — AB (ref 1.7–2.4)

## 2015-06-26 MED ORDER — HYDROMORPHONE HCL 1 MG/ML IJ SOLN
0.5000 mg | INTRAMUSCULAR | Status: DC | PRN
  Administered 2015-06-26 – 2015-06-27 (×3): 2 mg via INTRAVENOUS
  Filled 2015-06-26 (×3): qty 2

## 2015-06-26 MED ORDER — ENSURE ENLIVE PO LIQD
237.0000 mL | Freq: Three times a day (TID) | ORAL | Status: DC
  Administered 2015-06-26 – 2015-06-27 (×4): 237 mL via ORAL

## 2015-06-26 NOTE — Discharge Instructions (Signed)
[facility]      [facility] °[phone] ° °ABDOMINAL SURGERY: POST OP INSTRUCTIONS ° °Always review your discharge instruction sheet given to you by the facility where your surgery was performed. ° °IF YOU HAVE DISABILITY OR FAMILY LEAVE FORMS, YOU MUST BRING THEM TO THE OFFICE FOR PROCESSING.  PLEASE DO NOT GIVE THEM TO YOUR DOCTOR. ° °1. A prescription for pain medication may be given to you upon discharge.  Take your pain medication as prescribed, if needed.  If narcotic pain medicine is not needed, then you may take acetaminophen (Tylenol) or ibuprofen (Advil) as needed. °2. Take your usually prescribed medications unless otherwise directed. °3. If you need a refill on your pain medication, please contact your pharmacy. They will contact our office to request authorization.  Prescriptions will not be filled after 5pm or on week-ends. °4. You should follow a light diet the first few days after arrival home, such as soup and crackers, pudding, etc.unless your doctor has advised otherwise. A high-fiber, low fat diet can be resumed as tolerated.   Be sure to include lots of fluids daily. Most patients will experience some swelling and bruising on the chest and neck area.  Ice packs will help.  Swelling and bruising can take several days to resolve °5. Most patients will experience some swelling and bruising in the area of the incision. Ice pack will help. Swelling and bruising can take several days to resolve..  °6. It is common to experience some constipation if taking pain medication after surgery.  Increasing fluid intake and taking a stool softener will usually help or prevent this problem from occurring.  A mild laxative (Milk of Magnesia or Miralax) should be taken according to package directions if there are no bowel movements after 48 hours. °7.  You may have steri-strips (small skin tapes) in place directly over the incision.  These strips should be left on the skin for 10-14 days.  If your  surgeon used skin glue on the incision, you may shower in 48 hours.  The glue will flake off over the next 2-3 weeks.  Any sutures or staples will be removed at the office during your follow-up visit. You may find that a light gauze bandage over your incision may keep your staples from being rubbed or pulled. You may shower and replace the bandage daily. °8. ACTIVITIES:  You may resume regular (light) daily activities beginning the next day--such as daily self-care, walking, climbing stairs--gradually increasing activities as tolerated.  You may have sexual intercourse when it is comfortable.  Refrain from any heavy lifting or straining until approved by your doctor. °a. You may drive when you no longer are taking prescription pain medication, you can comfortably wear a seatbelt, and you can safely maneuver your car and apply brakes °b. Return to Work: __________8 weeks if applicable_________________________ °9. You should see your doctor in the office for a follow-up appointment approximately two weeks after your surgery.  Make sure that you call for this appointment within a day or two after you arrive home to insure a convenient appointment time. °OTHER INSTRUCTIONS:  °_____________________________________________________________ °_____________________________________________________________ ° °WHEN TO CALL YOUR DOCTOR: °1. Fever over 101.0 °2. Inability to urinate °3. Nausea and/or vomiting °4. Extreme swelling or bruising °5. Continued bleeding from incision. °6. Increased pain, redness, or drainage from the incision. °7. Difficulty swallowing or breathing °8. Muscle cramping or spasms. °9. Numbness or tingling in hands or feet or around lips. ° °The clinic staff is   available to answer your questions during regular business hours.  Please don’t hesitate to call and ask to speak to one of the nurses if you have concerns. ° °For further questions, please visit [email or web address] ° ° ° °

## 2015-06-26 NOTE — Clinical Documentation Improvement (Signed)
General Surgery  Abnormal Lab/Test Results:  2.9 on 06/25/15  Possible Clinical Conditions   Hypokalemia  Other Condition  Cannot Clinically Determine  Treatment Provided: K-Dur 20 mEq po bid 06/25/15  (please document your response in the progress notes and discharge summary, not on the query form itself.)   Please exercise your independent, professional judgment when responding. A specific answer is not anticipated or expected.   Thank You,  Erling Conte  RN BSN [facility] [phone] Health Information [facility] [place]

## 2015-06-26 NOTE — Progress Notes (Signed)
Shelia Arnold   DOB:1966/08/01   ZE#:[id number]   AUQ#:[id number]  Subjective: Pt is known to me. She underwent diagnostic laparoscopic and liver biopsy yesterday. Per Dr. Barry Dienes, the liver biopsy frozen was positive for metastasis, so her Whipple surgery was aborted. She is doing well today, his insight is still painful, but she has gotten out of bed and walk on the floor, eating well.  Objective:  Filed Vitals:   06/26/15 2114  BP: 150/92  Pulse: 90  Temp: 98.4 F (36.9 C)  Resp: 20    Body mass index is 23.42 kg/(m^2).  Intake/Output Summary (Last 24 hours) at 06/26/15 2157 Last data filed at 06/26/15 1811  Gross per 24 hour  Intake 1656.25 ml  Output      0 ml  Net 1656.25 ml     Sclerae unicteric  Oropharynx clear  No peripheral adenopathy  Lungs clear -- no rales or rhonchi  Heart regular rate and rhythm  Abdomen sofe  MSK no focal spinal tenderness, no peripheral edema  Neuro nonfocal   CBG (last 3)  No results for input(s): GLUCAP in the last 72 hours.   Labs:  Lab Results  Component Value Date   WBC 7.0 06/26/2015   HGB 10.3* 06/26/2015   HCT 31.5* 06/26/2015   MCV 92.4 06/26/2015   PLT 322 06/26/2015   NEUTROABS 7.1 06/16/2015    @LASTCHEMISTRY @  Urine Studies No results for input(s): UHGB, CRYS in the last 72 hours.  Invalid input(s): UACOL, UAPR, USPG, UPH, UTP, UGL, UKET, UBIL, UNIT, UROB, ULEU, UEPI, UWBC, URBC, UBAC, CAST, [place], [place]  Basic Metabolic Panel:  Recent Labs Lab 06/25/15 0823 06/25/15 1730 06/26/15 0323  NA 137  --  136  K 2.9*  --  4.6  CL  --   --  106  CO2  --   --  22  GLUCOSE  --   --  96  BUN  --   --  5*  CREATININE  --  0.65 0.58  CALCIUM  --   --  8.9  MG  --   --  1.6*  PHOS  --   --  4.7*   GFR Estimated Creatinine Clearance: 61.1 mL/min (by C-G formula based on Cr of 0.58). Liver Function Tests:  Recent Labs Lab 06/26/15 0323  AST 95*  ALT 54  ALKPHOS 102  BILITOT 1.8*  PROT 5.4*  ALBUMIN  2.8*   No results for input(s): LIPASE, AMYLASE in the last 168 hours. No results for input(s): AMMONIA in the last 168 hours. Coagulation profile No results for input(s): INR, PROTIME in the last 168 hours.  CBC:  Recent Labs Lab 06/25/15 0823 06/25/15 1730 06/26/15 0323  WBC  --  11.3* 7.0  HGB 10.2* 11.7* 10.3*  HCT 30.0* 36.1 31.5*  MCV  --  89.8 92.4  PLT  --  362 322   Cardiac Enzymes: No results for input(s): CKTOTAL, CKMB, CKMBINDEX, TROPONINI in the last 168 hours. BNP: Invalid input(s): POCBNP CBG: No results for input(s): GLUCAP in the last 168 hours. D-Dimer No results for input(s): DDIMER in the last 72 hours. Hgb A1c No results for input(s): HGBA1C in the last 72 hours. Lipid Profile No results for input(s): CHOL, HDL, LDLCALC, TRIG, CHOLHDL, LDLDIRECT in the last 72 hours. Thyroid function studies No results for input(s): TSH, T4TOTAL, T3FREE, THYROIDAB in the last 72 hours.  Invalid input(s): FREET3 Anemia work up No results for input(s): VITAMINB12, FOLATE, FERRITIN, TIBC, IRON,  RETICCTPCT in the last 72 hours. Microbiology No results found for this or any previous visit (from the past 240 hour(s)).    Studies:  No results found.  Assessment: 49 y.o.  1. 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 Anemia 3. Hyperbilinemia, status post stent placement, improving   Plan:  -I recommend her to focus on surgical recovery for now, and I will see her back in my clinic in 2 weeks  -We briefly discussed chemo therapy, probably FOLFIRINOX or gemcitabine and Abraxane as first line, may need a port placement if FOLFIRINOX or FOLFOX.   Truitt Merle, MD 06/26/2015  9:57 PM

## 2015-06-26 NOTE — Progress Notes (Signed)
1 Day Post-Op  Subjective: Patient is sore, but able to walk around independently.    Objective: Vital signs in last 24 hours: Temp:  [97.7 F (36.5 C)-98.3 F (36.8 C)] 98.3 F (36.8 C) (09/30 0949) Pulse Rate:  [60-85] 76 (09/30 1145) Resp:  [16-24] 19 (09/30 1154) BP: (108-161)/(61-93) 161/93 mmHg (09/30 1145) SpO2:  [100 %] 100 % (09/30 1154) FiO2 (%):  [100 %] 100 % (09/30 0611) Last BM Date: 06/24/15  Intake/Output from previous day: 09/29 0701 - 09/30 0700 In: 2543.8 [P.O.:240; I.V.:2253.8; IV Piggyback:50] Out: 100 [Urine:50; Blood:50] Intake/Output this shift: Total I/O In: 240 [P.O.:240] Out: -   General appearance: alert, cooperative and no distress Resp: breathing comfortably Cardio: regular rate and rhythm GI: soft, approp tender.  non distended  Lab Results:   Recent Labs  06/25/15 1730 06/26/15 0323  WBC 11.3* 7.0  HGB 11.7* 10.3*  HCT 36.1 31.5*  PLT 362 322   BMET  Recent Labs  06/25/15 0823 06/25/15 1730 06/26/15 0323  NA 137  --  136  K 2.9*  --  4.6  CL  --   --  106  CO2  --   --  22  GLUCOSE  --   --  96  BUN  --   --  5*  CREATININE  --  0.65 0.58  CALCIUM  --   --  8.9   PT/INR No results for input(s): LABPROT, INR in the last 72 hours. ABG  Recent Labs  06/25/15 0823  PHART 7.391  HCO3 22.3    Studies/Results: No results found.  Anti-infectives: Anti-infectives    Start     Dose/Rate Route Frequency Ordered Stop   06/25/15 1600  ceFAZolin (ANCEF) IVPB 2 g/50 mL premix     2 g 100 mL/hr over 30 Minutes Intravenous 3 times per day 06/25/15 1434 06/25/15 1744   06/25/15 0700  ceFAZolin (ANCEF) IVPB 2 g/50 mL premix     2 g 100 mL/hr over 30 Minutes Intravenous To [facility] 06/24/15 0844 06/25/15 0800      Assessment/Plan: s/p Procedure(s): LAPAROSCOPY DIAGNOSTIC (N/A) LIVER BIOPSY (N/A) Advance diet saline lock IVF  Switch to oral pain medications. Hopefully home tomorrow or Sunday  Follow up  with Dr. Burr Medico for chemotherapy.     LOS: 1 day    [facility] 06/26/2015

## 2015-06-26 NOTE — Progress Notes (Signed)
Initial Nutrition Assessment  DOCUMENTATION CODES:   Non-severe (moderate) malnutrition in context of chronic illness  INTERVENTION:   -D/c Boost Breeze po TID, each supplement provides 250 kcal and 9 grams of protein -Ensure Enlive po TID, each supplement provides 350 kcal and 20 grams of protein  NUTRITION DIAGNOSIS:   Malnutrition related to chronic illness as evidenced by mild depletion of body fat, mild depletion of muscle mass.  GOAL:   Patient will meet greater than or equal to 90% of their needs  MONITOR:   PO intake, Supplement acceptance, Labs, Weight trends, Skin, I & O's  REASON FOR ASSESSMENT:   Malnutrition Screening Tool    ASSESSMENT:   The patient is a 49 year old female who presents with a pancreatic mass. Patient is a 49 year old female who was admitted to the hospital on August 19 for jaundice. She is referred for consultation by Dr. Burr Medico for new diagnosis of pancreatic/duodenal/ampullary carcinoma. She also had significant itching and dark-colored urine. She complained of right abdominal pain. She has had some weight loss and anorexia. She first received an ultrasound which showed a mass in the common bile duct and dilation of the common bile duct. She was seen over 3.6 cm hypervascular mass in the head of the pancreas. She had an ERCP which showed a mass invading the duodenum and a metallic stent was placed. Pathology is consistent with adenocarcinoma. We reviewed the patient at multidisciplinary cancer conference this week. Although the mass does appear to be consistent with the head of the pancreas, and it could certainly also be duodenal or ampullary.  S/p Procedure(s) on 06/25/15: Diagnostic laparoscopy, exploratory laparotomy, liver wedge resection  Pt admitted with pancreatic mass.   Pt has been followed by the RD at [facility]. She endorses weight loss, revealing UBW of 120#. She reports that she was 106# the last time she was weighed. Wt of 118# verified  on bedscale.   Pt reports that she has been diligent in improving her protein intake and drinking Ensure supplements TID as prescribed to help improve her nutritional status. Pt was consuming 3 meals per day PTA (Breakfast: eggs, pudding, oatmeal; Lunch: sandwich; Dinner: meat starch, and vegetable). She reports she is in a lot of pain, but her appetite is slowly returning; she reveals she ate all of her breakfast but the oatmeal.  Pt was consuming Boost Breeze supplement; noted about 50% completion. Pt would prefer the Ensure Enlive supplements- RD to order. Reinforced importance of good meal and supplement intake to promote healing. Pt expresses desire to continue to follow-up with [facility] RD as needed.   Nutrition-Focused physical exam completed. Findings are mild fat depletion, mild muscle depletion, and no edema.   Labs reviewed.  Diet Order:  Diet regular Room service appropriate?: Yes; Fluid consistency:: Thin  Skin:  Reviewed, no issues  Last BM:  06/24/15  Height:   Ht Readings from Last 1 Encounters:  06/25/15 5' (1.524 m)    Weight:   Wt Readings from Last 1 Encounters:  06/25/15 119 lb 14.4 oz (54.386 kg)    Ideal Body Weight:  45.5 kg  BMI:  Body mass index is 23.42 kg/(m^2).  Estimated Nutritional Needs:   Kcal:  1600-1800  Protein:  70-85 grams  Fluid:  1.6-1.8 L  EDUCATION NEEDS:   Education needs addressed  Jenifer A. Jimmye Norman, RD, LDN, CDE Pager: [phone] After hours Pager: [phone]

## 2015-06-27 LAB — COMPREHENSIVE METABOLIC PANEL
ALBUMIN: 2.9 g/dL — AB (ref 3.5–5.0)
ALT: 50 U/L (ref 14–54)
AST: 71 U/L — AB (ref 15–41)
Alkaline Phosphatase: 108 U/L (ref 38–126)
Anion gap: 8 (ref 5–15)
BUN: 6 mg/dL (ref 6–20)
CHLORIDE: 102 mmol/L (ref 101–111)
CO2: 26 mmol/L (ref 22–32)
Calcium: 9.5 mg/dL (ref 8.9–10.3)
Creatinine, Ser: 0.53 mg/dL (ref 0.44–1.00)
GFR calc Af Amer: >60 mL/min (ref >60)
GFR calc non Af Amer: >60 mL/min (ref >60)
GLUCOSE: 146 mg/dL — AB (ref 65–99)
POTASSIUM: 3.7 mmol/L (ref 3.5–5.1)
Sodium: 136 mmol/L (ref 135–145)
Total Bilirubin: 1.3 mg/dL — ABNORMAL HIGH (ref 0.3–1.2)
Total Protein: 6.1 g/dL — ABNORMAL LOW (ref 6.5–8.1)

## 2015-06-27 LAB — CBC
HCT: 32.6 % — ABNORMAL LOW (ref 36.0–46.0)
Hemoglobin: 10.7 g/dL — ABNORMAL LOW (ref 12.0–15.0)
MCH: 29.9 pg (ref 26.0–34.0)
MCHC: 32.8 g/dL (ref 30.0–36.0)
MCV: 91.1 fL (ref 78.0–100.0)
PLATELETS: 368 10*3/uL (ref 150–400)
RBC: 3.58 MIL/uL — ABNORMAL LOW (ref 3.87–5.11)
RDW: 15.4 % (ref 11.5–15.5)
WBC: 8.9 10*3/uL (ref 4.0–10.5)

## 2015-06-27 LAB — GLUCOSE, CAPILLARY: GLUCOSE-CAPILLARY: 113 mg/dL — AB (ref 65–99)

## 2015-06-27 NOTE — Progress Notes (Signed)
2 Days Post-Op  Subjective: Alert and stable. Complains of incisional pain.  States she is not ready to go home today.  Maybe tomorrow. Has not ambulated.  Uses bedside commode.  Asking for a walker. Tolerating diet.  Voiding uneventfully.  Had a bowel movement yesterday. Still has On-Q in place ---out tomorrow.  Objective: Vital signs in last 24 hours: Temp:  [97.8 F (36.6 C)-98.9 F (37.2 C)] 98.9 F (37.2 C) (10/01 0455) Pulse Rate:  [76-101] 101 (10/01 0455) Resp:  [18-20] 20 (10/01 0455) BP: (113-166)/(69-98) 151/83 mmHg (10/01 0455) SpO2:  [93 %-100 %] 93 % (10/01 0455) Last BM Date: 06/24/15  Intake/Output from previous day: 09/30 0701 - 10/01 0700 In: 2717.5 [P.O.:960; I.V.:1757.5] Out: 75 [Urine:75] Intake/Output this shift: Total I/O In: 1622.5 [P.O.:240; I.V.:1382.5] Out: 75 [Urine:75]   EXAM: General appearance: Alert.  Cooperative.  Does not appear to be in any physical distress.  Very talkative. Resp: clear to auscultation bilaterally GI: Soft.  Protuberant.  Not tympanitic.  Wound clean.On-Q in place.   Lab Results:  Results for orders placed or performed during the hospital encounter of 06/25/15 (from the past 24 hour(s))  CBC     Status: Abnormal   Collection Time: 06/27/15  5:44 AM  Result Value Ref Range   WBC 8.9 4.0 - 10.5 K/uL   RBC 3.58 (L) 3.87 - 5.11 MIL/uL   Hemoglobin 10.7 (L) 12.0 - 15.0 g/dL   HCT 32.6 (L) 36.0 - 46.0 %   MCV 91.1 78.0 - 100.0 fL   MCH 29.9 26.0 - 34.0 pg   MCHC 32.8 30.0 - 36.0 g/dL   RDW 15.4 11.5 - 15.5 %   Platelets 368 150 - 400 K/uL  Comprehensive metabolic panel     Status: Abnormal   Collection Time: 06/27/15  5:44 AM  Result Value Ref Range   Sodium 136 135 - 145 mmol/L   Potassium 3.7 3.5 - 5.1 mmol/L   Chloride 102 101 - 111 mmol/L   CO2 26 22 - 32 mmol/L   Glucose, Bld 146 (H) 65 - 99 mg/dL   BUN 6 6 - 20 mg/dL   Creatinine, Ser 0.53 0.44 - 1.00 mg/dL   Calcium 9.5 8.9 - 10.3 mg/dL   Total Protein  6.1 (L) 6.5 - 8.1 g/dL   Albumin 2.9 (L) 3.5 - 5.0 g/dL   AST 71 (H) 15 - 41 U/L   ALT 50 14 - 54 U/L   Alkaline Phosphatase 108 38 - 126 U/L   Total Bilirubin 1.3 (H) 0.3 - 1.2 mg/dL   GFR calc non Af Amer >60 >60 mL/min   GFR calc Af Amer >60 >60 mL/min   Anion gap 8 5 - 15     Studies/Results: No results found.  Marland Kitchen amitriptyline  25 mg Oral QHS  . diclofenac  1 patch Transdermal BID  . enoxaparin (LOVENOX) injection  40 mg Subcutaneous Q24H  . feeding supplement (ENSURE ENLIVE)  237 mL Oral TID BM  . hydrochlorothiazide  25 mg Oral Daily  . levETIRAcetam  750 mg Oral BID  . polyethylene glycol  17 g Oral BID  . potassium chloride SA  20 mEq Oral BID  . 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 Home tomorrow.  Patient  agrees and will discuss with family.  Plan outpatient Port-A-Cath insertion... Dr. Barry Dienes to arrange. Plan medical oncology follow-up with Dr. Burr Medico in 2 weeks for chemotherapy.  @PROBHOSP @  LOS: 2 days    Khoury Siemon M 06/27/2015  . .prob

## 2015-06-28 ENCOUNTER — Other Ambulatory Visit: Payer: Self-pay | Admitting: General Surgery

## 2015-06-28 MED ORDER — TRAMADOL HCL 50 MG PO TABS: 50.0000 mg | ORAL_TABLET | Freq: Four times a day (QID) | ORAL | Status: DC | PRN

## 2015-06-28 MED ORDER — OXYCODONE-ACETAMINOPHEN 5-325 MG PO TABS: 1.0000 | ORAL_TABLET | Freq: Four times a day (QID) | ORAL | Status: DC | PRN

## 2015-06-28 MED ORDER — POLYETHYLENE GLYCOL 3350 17 G PO PACK
17.0000 g | PACK | Freq: Once | ORAL | Status: DC
  Filled 2015-06-28: qty 1

## 2015-06-28 NOTE — Progress Notes (Signed)
Shelia Arnold to be D/C'd Home per MD order.  Discussed with the patient and all questions fully answered.  VSS, Skin clean, dry and intact without evidence of skin break down, no evidence of skin tears noted. IV catheter discontinued intact. Site without signs and symptoms of complications. Dressing and pressure applied.  An After Visit Summary was printed and given to the patient. Patient received prescription.  D/c education completed with patient/family including follow up instructions, medication list, d/c activities limitations if indicated, with other d/c instructions as indicated by MD - patient able to verbalize understanding, all questions fully answered.   Patient instructed to return to ED, call 911, or call MD for any changes in condition.   Patient escorted via [place], and D/C home via private auto.  [name] [name] 06/28/2015 10:10 AM

## 2015-06-28 NOTE — Discharge Summary (Signed)
Patient ID: Shelia Arnold [id number] 49 y.o. [birth year]  Admit date: 06/25/2015  Discharge date and time: 06/28/2015  Admitting Physician: Stark Klein  Discharge Physician: Adin Hector  Admission Diagnoses: DUODENAL CANCER  Discharge Diagnoses: Adenocarcinoma, periampullary pancreatic                                         Metastatic adenocarcinoma to liver                                         Protein calorie malnutrition, moderate                                         Jaundice, status post ERCP with metallic stent placement                                          Seizure disorder                                          Depression, unspecified                                          Alcohol abuse                                          Tobacco abuse                                           Operations: Procedure(s): LAPAROSCOPY DIAGNOSTIC LIVER BIOPSY  Admission Condition: fair  Discharged Condition: fair  Indication for Admission: This is a 49 year old female who presented with jaundice and a pancreatic mass.  She was hospitalized August 19 for jaundice, right-sided abdominal pain, postprandial abdominal pain, weight loss and anorexia.  Imaging showed a 3.6 on a hypervascular mass in the head of the pancreas.  ERCP showed a mass invading the duodenum and a metallic stent was placed.  Biopsy confirmed adenocarcinoma.  There was no evidence of metastatic disease by imaging studies but the tumor did appear to be tracking up the common bile duct.      Jaundice improved and she was readmitted electively by Dr. Barry Dienes for staging laparoscopy, and possible pancreaticoduodenectomy.  Hospital Course:  On the day of admission the patient was taken to the operating room and underwent diagnostic laparoscopy.  There were no obvious lesions noted.  A midline incision was then made in the liver was palpated and there were numerous firm lesions under the capsule of the  liver.  One of these was resected with the conservative wedge resection.  Frozen section confirmed adenocarcinoma.  Definitive pancreatic resection was aborted the.  The incision was closed.  Postoperatively the patient had moderate to severe incisional pain from a couple of days.  On-Q device was used for 3 days and was doing discontinued at the time of discharge.  She progressed in her diet and activities.  On the day of discharge she was tolerating regular diet, had ambulated in the halls.  Was passing flatus.  Had no nausea.  Her only complaint was incisional pain.  Because of this I gave her a prescription for Percocet and for tramadol.  She was told to continue her usual medications.  We gave her a dose of MiraLAX which she takes at home.  Diet and activities were discussed.    Postoperatively the patient was seen in consultation by Dr. Truitt Merle who plans follow-up in her clinic in 2 weeks for discussion of palliative chemotherapy.    Dr. Barry Dienes plans to put insert Port-A-Cath as an outpatient in the future that will be arranged.  The patient is aware of that as well.  The patient will also follow-up with Dr. Barry Dienes in the office in 2 weeks.  Consults: hematology/oncology  Significant Diagnostic Studies: Surgical pathology  Treatments: surgery: Laparoscopy, laparotomy, wedge resection of liver mass.  Disposition: Home  Patient Instructions:    Medication List    TAKE these medications        amitriptyline 25 MG tablet  Commonly known as:  ELAVIL  TAKE ONE TABLET BY MOUTH AT BEDTIME     diclofenac 1.3 % Ptch  Commonly known as:  FLECTOR  PLACE 1 PATCH ONTO THE SKIN TWICE DAILY     diclofenac sodium 1 % Gel  Commonly known as:  VOLTAREN  Apply 2 g topically 4 (four) times daily.     GOLD BOND MEDICATED BODY 5-0.15 % Lotn  Apply 1 application topically daily as needed (dry skin).     hydrochlorothiazide 25 MG tablet  Commonly known as:  HYDRODIURIL  Take 0.5 tablets (12.5 mg  total) by mouth daily.     ibuprofen 400 MG tablet  Commonly known as:  ADVIL,MOTRIN  Take 1 tablet (400 mg total) by mouth every 6 (six) hours as needed for moderate pain.     levETIRAcetam 1000 MG tablet  Commonly known as:  KEPPRA  TAKE 1 AND 1/2 TABLETS BY MOUTH EVERY 12 HOURS     multivitamin with minerals Tabs tablet  Take 1 tablet by mouth daily.     oxyCODONE-acetaminophen 5-325 MG tablet  Commonly known as:  PERCOCET/ROXICET  Take 1 tablet by mouth every 6 (six) hours as needed for severe pain.     oxyCODONE-acetaminophen 5-325 MG tablet  Commonly known as:  PERCOCET/ROXICET  Take 1 tablet by mouth every 6 (six) hours as needed for severe pain.     polyethylene glycol packet  Commonly known as:  MIRALAX / GLYCOLAX  Take 17 g by mouth 2 (two) times daily.     potassium chloride SA 20 MEQ tablet  Commonly known as:  K-DUR,KLOR-CON  Take 1 tablet (20 mEq total) by mouth 2 (two) times daily.     Senna 8.6-50 MG Tabs  Take 1 tablet by mouth 2 (two) times daily.     traMADol 50 MG tablet  Commonly known as:  ULTRAM  Take 1 tablet (50 mg total) by mouth every 6 (six) hours as needed (Please only give tramadol if ibuprofen does not alleviate her back pain).     traMADol 50 MG tablet  Commonly known as:  ULTRAM  Take 1 tablet (  50 mg total) by mouth every 6 (six) hours as needed (Please only give tramadol if ibuprofen does not alleviate her back pain).        Activity: Walk a lot.  Drink lots of fluids.  Do not drive a car.  You may shower.  No sports or heavy lifting. Diet: regular diet Wound Care: none needed  Follow-up:  With Dr. Barry Dienes in 2 weeks.  Signed: Edsel Petrin. Dalbert Batman, M.D., FACS General and minimally invasive surgery Breast and Colorectal Surgery  06/28/2015, 7:34 AM

## 2015-06-29 ENCOUNTER — Encounter (HOSPITAL_BASED_OUTPATIENT_CLINIC_OR_DEPARTMENT_OTHER): Payer: Self-pay | Admitting: *Deleted

## 2015-06-30 LAB — TYPE AND SCREEN
ABO/RH(D): B POS
ANTIBODY SCREEN: NEGATIVE
UNIT DIVISION: 0
UNIT DIVISION: 0
UNIT DIVISION: 0
Unit division: 0
Unit division: 0
Unit division: 0
Unit division: 0
Unit division: 0

## 2015-07-01 ENCOUNTER — Ambulatory Visit: Payer: Medicaid Other | Admitting: Neurology

## 2015-07-01 ENCOUNTER — Encounter: Payer: Self-pay | Admitting: *Deleted

## 2015-07-01 ENCOUNTER — Ambulatory Visit (HOSPITAL_COMMUNITY): Payer: Medicaid Other

## 2015-07-01 ENCOUNTER — Other Ambulatory Visit: Payer: Self-pay | Admitting: *Deleted

## 2015-07-01 ENCOUNTER — Encounter (HOSPITAL_BASED_OUTPATIENT_CLINIC_OR_DEPARTMENT_OTHER)
Admission: RE | Disposition: A | Discharge status: Home / Self Care | Payer: Self-pay | Source: Ambulatory Visit | Attending: General Surgery

## 2015-07-01 ENCOUNTER — Ambulatory Visit (HOSPITAL_BASED_OUTPATIENT_CLINIC_OR_DEPARTMENT_OTHER): Payer: Medicaid Other | Admitting: Anesthesiology

## 2015-07-01 ENCOUNTER — Ambulatory Visit (HOSPITAL_BASED_OUTPATIENT_CLINIC_OR_DEPARTMENT_OTHER)
Admission: RE | Admit: 2015-07-01 | Discharge: 2015-07-01 | Disposition: A | Discharge status: Home / Self Care | Payer: Medicaid Other | Source: Ambulatory Visit | Attending: General Surgery | Admitting: General Surgery

## 2015-07-01 ENCOUNTER — Encounter (HOSPITAL_BASED_OUTPATIENT_CLINIC_OR_DEPARTMENT_OTHER): Payer: Self-pay

## 2015-07-01 DIAGNOSIS — F17210 Nicotine dependence, cigarettes, uncomplicated: Secondary | ICD-10-CM | POA: Insufficient documentation

## 2015-07-01 DIAGNOSIS — F1210 Cannabis abuse, uncomplicated: Secondary | ICD-10-CM | POA: Insufficient documentation

## 2015-07-01 DIAGNOSIS — C241 Malignant neoplasm of ampulla of Vater: Secondary | ICD-10-CM | POA: Insufficient documentation

## 2015-07-01 DIAGNOSIS — C170 Malignant neoplasm of duodenum: Secondary | ICD-10-CM

## 2015-07-01 DIAGNOSIS — Z95828 Presence of other vascular implants and grafts: Secondary | ICD-10-CM

## 2015-07-01 HISTORY — PX: PORTACATH PLACEMENT: SHX2246

## 2015-07-01 SURGERY — INSERTION, TUNNELED CENTRAL VENOUS DEVICE, WITH PORT
Anesthesia: General | Site: Chest | Laterality: Left

## 2015-07-01 MED ORDER — FENTANYL CITRATE (PF) 100 MCG/2ML IJ SOLN
50.0000 ug | INTRAMUSCULAR | Status: DC | PRN
  Administered 2015-07-01: 100 ug via INTRAVENOUS

## 2015-07-01 MED ORDER — HEPARIN SOD (PORK) LOCK FLUSH 100 UNIT/ML IV SOLN
INTRAVENOUS | Status: DC | PRN
  Administered 2015-07-01: 500 [IU] via INTRAVENOUS

## 2015-07-01 MED ORDER — SODIUM CHLORIDE 0.9 % IV SOLN
INTRAVENOUS | Status: DC | PRN
  Administered 2015-07-01: 50 mL

## 2015-07-01 MED ORDER — LACTATED RINGERS IV SOLN
INTRAVENOUS | Status: DC
  Administered 2015-07-01 (×2): via INTRAVENOUS

## 2015-07-01 MED ORDER — MEPERIDINE HCL 25 MG/ML IJ SOLN: 6.2500 mg | INTRAMUSCULAR | Status: DC | PRN

## 2015-07-01 MED ORDER — SODIUM CHLORIDE 0.9 % IJ SOLN: 3.0000 mL | INTRAMUSCULAR | Status: DC | PRN

## 2015-07-01 MED ORDER — ONDANSETRON HCL 4 MG/2ML IJ SOLN
INTRAMUSCULAR | Status: DC | PRN
  Administered 2015-07-01: 4 mg via INTRAVENOUS

## 2015-07-01 MED ORDER — CEFAZOLIN SODIUM-DEXTROSE 2-3 GM-% IV SOLR
INTRAVENOUS | Status: AC
  Filled 2015-07-01: qty 50

## 2015-07-01 MED ORDER — SCOPOLAMINE 1 MG/3DAYS TD PT72: 1.0000 | MEDICATED_PATCH | Freq: Once | TRANSDERMAL | Status: DC | PRN

## 2015-07-01 MED ORDER — HEPARIN SOD (PORK) LOCK FLUSH 100 UNIT/ML IV SOLN
INTRAVENOUS | Status: AC
  Filled 2015-07-01: qty 5

## 2015-07-01 MED ORDER — FENTANYL CITRATE (PF) 100 MCG/2ML IJ SOLN
INTRAMUSCULAR | Status: AC
  Filled 2015-07-01: qty 4

## 2015-07-01 MED ORDER — MIDAZOLAM HCL 2 MG/2ML IJ SOLN
INTRAMUSCULAR | Status: AC
  Filled 2015-07-01: qty 2

## 2015-07-01 MED ORDER — OXYCODONE HCL 5 MG PO TABS: 5.0000 mg | ORAL_TABLET | ORAL | Status: DC | PRN

## 2015-07-01 MED ORDER — PHENYLEPHRINE 40 MCG/ML (10ML) SYRINGE FOR IV PUSH (FOR BLOOD PRESSURE SUPPORT)
PREFILLED_SYRINGE | INTRAVENOUS | Status: AC
  Filled 2015-07-01: qty 10

## 2015-07-01 MED ORDER — FENTANYL CITRATE (PF) 100 MCG/2ML IJ SOLN: 25.0000 ug | INTRAMUSCULAR | Status: DC | PRN

## 2015-07-01 MED ORDER — ACETAMINOPHEN 325 MG PO TABS: 650.0000 mg | ORAL_TABLET | ORAL | Status: DC | PRN

## 2015-07-01 MED ORDER — BUPIVACAINE-EPINEPHRINE (PF) 0.5% -1:200000 IJ SOLN
INTRAMUSCULAR | Status: AC
  Filled 2015-07-01: qty 30

## 2015-07-01 MED ORDER — MIDAZOLAM HCL 2 MG/2ML IJ SOLN: 1.0000 mg | INTRAMUSCULAR | Status: DC | PRN

## 2015-07-01 MED ORDER — LIDOCAINE HCL (CARDIAC) 20 MG/ML IV SOLN
INTRAVENOUS | Status: DC | PRN
  Administered 2015-07-01: 80 mg via INTRAVENOUS

## 2015-07-01 MED ORDER — CEFAZOLIN SODIUM-DEXTROSE 2-3 GM-% IV SOLR
2.0000 g | INTRAVENOUS | Status: AC
  Administered 2015-07-01: 2 g via INTRAVENOUS

## 2015-07-01 MED ORDER — PHENYLEPHRINE HCL 10 MG/ML IJ SOLN
INTRAMUSCULAR | Status: DC | PRN
  Administered 2015-07-01: 80 ug via INTRAVENOUS
  Administered 2015-07-01 (×3): 40 ug via INTRAVENOUS

## 2015-07-01 MED ORDER — ONDANSETRON HCL 4 MG/2ML IJ SOLN
INTRAMUSCULAR | Status: AC
  Filled 2015-07-01: qty 2

## 2015-07-01 MED ORDER — GLYCOPYRROLATE 0.2 MG/ML IJ SOLN: 0.2000 mg | Freq: Once | INTRAMUSCULAR | Status: DC | PRN

## 2015-07-01 MED ORDER — DEXAMETHASONE SODIUM PHOSPHATE 4 MG/ML IJ SOLN
INTRAMUSCULAR | Status: DC | PRN
  Administered 2015-07-01: 10 mg via INTRAVENOUS

## 2015-07-01 MED ORDER — SODIUM CHLORIDE 0.9 % IJ SOLN: 3.0000 mL | Freq: Two times a day (BID) | INTRAMUSCULAR | Status: DC

## 2015-07-01 MED ORDER — SODIUM CHLORIDE 0.9 % IV SOLN: 250.0000 mL | INTRAVENOUS | Status: DC | PRN

## 2015-07-01 MED ORDER — ACETAMINOPHEN 650 MG RE SUPP: 650.0000 mg | RECTAL | Status: DC | PRN

## 2015-07-01 MED ORDER — PROPOFOL 10 MG/ML IV BOLUS
INTRAVENOUS | Status: AC
  Filled 2015-07-01: qty 20

## 2015-07-01 MED ORDER — BUPIVACAINE-EPINEPHRINE 0.5% -1:200000 IJ SOLN
INTRAMUSCULAR | Status: DC | PRN
Start: 2015-07-01 — End: 2015-07-01
  Administered 2015-07-01: 10 mL

## 2015-07-01 MED ORDER — PROPOFOL 10 MG/ML IV BOLUS
INTRAVENOUS | Status: DC | PRN
  Administered 2015-07-01: 200 mg via INTRAVENOUS

## 2015-07-01 SURGICAL SUPPLY — 43 items
BAG DECANTER FOR FLEXI CONT (MISCELLANEOUS) ×3 IMPLANT
BLADE HEX COATED 2.75 (ELECTRODE) ×3 IMPLANT
BLADE SURG 11 STRL SS (BLADE) ×3 IMPLANT
BLADE SURG 15 STRL LF DISP TIS (BLADE) ×1 IMPLANT
BLADE SURG 15 STRL SS (BLADE) ×2
CHLORAPREP W/TINT 26ML (MISCELLANEOUS) ×3 IMPLANT
COVER BACK TABLE 60X90IN (DRAPES) ×3 IMPLANT
COVER MAYO STAND STRL (DRAPES) ×3 IMPLANT
DECANTER SPIKE VIAL GLASS SM (MISCELLANEOUS) IMPLANT
DRAPE C-ARM 42X72 X-RAY (DRAPES) ×3 IMPLANT
DRAPE LAPAROTOMY TRNSV 102X78 (DRAPE) ×3 IMPLANT
DRAPE UTILITY XL STRL (DRAPES) ×3 IMPLANT
DRSG TEGADERM 4X4.75 (GAUZE/BANDAGES/DRESSINGS) IMPLANT
ELECT REM PT RETURN 9FT ADLT (ELECTROSURGICAL) ×3
ELECTRODE REM PT RTRN 9FT ADLT (ELECTROSURGICAL) ×1 IMPLANT
GLOVE BIO SURGEON STRL SZ 6 (GLOVE) ×3 IMPLANT
GLOVE BIO SURGEON STRL SZ 6.5 (GLOVE) ×2 IMPLANT
GLOVE BIO SURGEONS STRL SZ 6.5 (GLOVE) ×1
GLOVE BIOGEL PI IND STRL 6.5 (GLOVE) ×1 IMPLANT
GLOVE BIOGEL PI IND STRL 7.0 (GLOVE) ×1 IMPLANT
GLOVE BIOGEL PI INDICATOR 6.5 (GLOVE) ×2
GLOVE BIOGEL PI INDICATOR 7.0 (GLOVE) ×2
GLOVE EXAM NITRILE LRG STRL (GLOVE) ×3 IMPLANT
GOWN STRL REUS W/ TWL LRG LVL3 (GOWN DISPOSABLE) ×1 IMPLANT
GOWN STRL REUS W/TWL 2XL LVL3 (GOWN DISPOSABLE) ×3 IMPLANT
GOWN STRL REUS W/TWL LRG LVL3 (GOWN DISPOSABLE) ×2
KIT PORT POWER 8FR ISP CVUE (Catheter) ×3 IMPLANT
LIQUID BAND (GAUZE/BANDAGES/DRESSINGS) ×3 IMPLANT
NEEDLE HYPO 25X1 1.5 SAFETY (NEEDLE) ×3 IMPLANT
PACK BASIN DAY SURGERY FS (CUSTOM PROCEDURE TRAY) ×3 IMPLANT
PENCIL BUTTON HOLSTER BLD 10FT (ELECTRODE) ×3 IMPLANT
SLEEVE SCD COMPRESS KNEE MED (MISCELLANEOUS) ×3 IMPLANT
SPONGE GAUZE 4X4 12PLY STER LF (GAUZE/BANDAGES/DRESSINGS) IMPLANT
SUT MNCRL AB 4-0 PS2 18 (SUTURE) ×3 IMPLANT
SUT PROLENE 2 0 SH DA (SUTURE) ×6 IMPLANT
SUT VIC AB 3-0 SH 27 (SUTURE) ×2
SUT VIC AB 3-0 SH 27X BRD (SUTURE) ×1 IMPLANT
SUT VICRYL 3-0 CR8 SH (SUTURE) IMPLANT
SYR 5ML LUER SLIP (SYRINGE) ×3 IMPLANT
SYR CONTROL 10ML LL (SYRINGE) ×3 IMPLANT
SYRINGE 10CC LL (SYRINGE) ×3 IMPLANT
TOWEL OR 17X24 6PK STRL BLUE (TOWEL DISPOSABLE) ×3 IMPLANT
TOWEL OR NON WOVEN STRL DISP B (DISPOSABLE) ×3 IMPLANT

## 2015-07-01 NOTE — Anesthesia Preprocedure Evaluation (Signed)
Anesthesia Evaluation  Patient identified by MRN, date of birth, ID band Patient awake    Reviewed: Allergy & Precautions, NPO status , Patient's Chart, lab work & pertinent test results  Airway Mallampati: II  TM Distance: >3 FB Neck ROM: Full  Mouth opening: Limited Mouth Opening  Dental  (+) Teeth Intact, Dental Advisory Given   Pulmonary Current Smoker,    breath sounds clear to auscultation       Cardiovascular hypertension, Pt. on medications + Past MI   Rhythm:Regular Rate:Normal     Neuro/Psych    GI/Hepatic   Endo/Other    Renal/GU      Musculoskeletal   Abdominal   Peds  Hematology   Anesthesia Other Findings   Reproductive/Obstetrics                             Anesthesia Physical Anesthesia Plan  ASA: II  Anesthesia Plan: General   Post-op Pain Management:    Induction: Intravenous  Airway Management Planned: LMA  Additional Equipment:   Intra-op Plan:   Post-operative Plan: Extubation in OR  Informed Consent: I have reviewed the patients History and Physical, chart, labs and discussed the procedure including the risks, benefits and alternatives for the proposed anesthesia with the patient or authorized representative who has indicated his/her understanding and acceptance.   Dental advisory given  Plan Discussed with: CRNA, Anesthesiologist and Surgeon  Anesthesia Plan Comments:         Anesthesia Quick Evaluation

## 2015-07-01 NOTE — Anesthesia Postprocedure Evaluation (Signed)
  Anesthesia Post-op Note  Patient: Shelia Arnold  Procedure(s) Performed: Procedure(s): INSERTION PORT-A-CATH (Left)  Patient Location: PACU  Anesthesia Type: General   Level of Consciousness: awake, alert  and oriented  Airway and Oxygen Therapy: Patient Spontanous Breathing  Post-op Pain: mild  Post-op Assessment: Post-op Vital signs reviewed  Post-op Vital Signs: Reviewed  Last Vitals:  Filed Vitals:   07/01/15 1645  BP: 149/86  Pulse: 81  Temp:   Resp: 18    Complications: No apparent anesthesia complications

## 2015-07-01 NOTE — Progress Notes (Signed)
Noted surgery was on 06/25/15-POF to scheduler to see Dr. Burr Medico week of 10/17. PAC being placed today by Dr. Barry Dienes.

## 2015-07-01 NOTE — Discharge Instructions (Addendum)
[facility] °Office Phone Number [phone] ° ° POST OP INSTRUCTIONS ° °Always review your discharge instruction sheet given to you by the facility where your surgery was performed. ° °IF YOU HAVE DISABILITY OR FAMILY LEAVE FORMS, YOU MUST BRING THEM TO THE OFFICE FOR PROCESSING.  DO NOT GIVE THEM TO YOUR DOCTOR. ° °1. A prescription for pain medication may be given to you upon discharge.  Take your pain medication as prescribed, if needed.  If narcotic pain medicine is not needed, then you may take acetaminophen (Tylenol) or ibuprofen (Advil) as needed. °2. Take your usually prescribed medications unless otherwise directed °3. If you need a refill on your pain medication, please contact your pharmacy.  They will contact our office to request authorization.  Prescriptions will not be filled after 5pm or on week-ends. °4. You should eat very light the first 24 hours after surgery, such as soup, crackers, pudding, etc.  Resume your normal diet the day after surgery °5. It is common to experience some constipation if taking pain medication after surgery.  Increasing fluid intake and taking a stool softener will usually help or prevent this problem from occurring.  A mild laxative (Milk of Magnesia or Miralax) should be taken according to package directions if there are no bowel movements after 48 hours. °6. You may shower in 48 hours.  The surgical glue will flake off in 2-3 weeks.   °7. ACTIVITIES:  No strenuous activity or heavy lifting for 1 week.   °a. You may drive when you no longer are taking prescription pain medication, you can comfortably wear a seatbelt, and you can safely maneuver your car and apply brakes. °b. RETURN TO WORK:  __________to be determined._______________ °You should see your doctor in the office for a follow-up appointment approximately three-four weeks after your surgery.   ° °WHEN TO CALL YOUR DOCTOR: °1. Fever over 101.0 °2. Nausea and/or vomiting. °3. Extreme swelling  or bruising. °4. Continued bleeding from incision. °5. Increased pain, redness, or drainage from the incision. ° °The clinic staff is available to answer your questions during regular business hours.  Please don’t hesitate to call and ask to speak to one of the nurses for clinical concerns.  If you have a medical emergency, go to the nearest emergency room or call 911.  A surgeon from [facility] is always on call at the hospital. ° °For further questions, please visit [email or web address]  ° ° ° ° °Post Anesthesia Home Care Instructions ° °Activity: °Get plenty of rest for the remainder of the day. A responsible adult should stay with you for 24 hours following the procedure.  °For the next 24 hours, DO NOT: °-Drive a car °-Operate machinery °-Drink alcoholic beverages °-Take any medication unless instructed by your physician °-Make any legal decisions or sign important papers. ° °Meals: °Start with liquid foods such as gelatin or soup. Progress to regular foods as tolerated. Avoid greasy, spicy, heavy foods. If nausea and/or vomiting occur, drink only clear liquids until the nausea and/or vomiting subsides. Call your physician if vomiting continues. ° °Special Instructions/Symptoms: °Your throat may feel dry or sore from the anesthesia or the breathing tube placed in your throat during surgery. If this causes discomfort, gargle with warm salt water. The discomfort should disappear within 24 hours. ° °If you had a scopolamine patch placed behind your ear for the management of post- operative nausea and/or vomiting: ° °1. The medication in the patch is effective for 72 hours, after   which it should be removed.  Wrap patch in a tissue and discard in the trash. Wash hands thoroughly with soap and water. °2. You may remove the patch earlier than 72 hours if you experience unpleasant side effects which may include dry mouth, dizziness or visual disturbances. °3. Avoid touching the patch. Wash your  hands with soap and water after contact with the patch. °  ° ° °

## 2015-07-01 NOTE — Anesthesia Procedure Notes (Signed)
Procedure Name: LMA Insertion Date/Time: 07/01/2015 3:52 PM Performed by: Baxter Flattery Pre-anesthesia Checklist: Patient identified, Emergency Drugs available, Suction available and Patient being monitored Patient Re-evaluated:Patient Re-evaluated prior to inductionOxygen Delivery Method: Circle System Utilized Preoxygenation: Pre-oxygenation with 100% oxygen Intubation Type: IV induction Ventilation: Mask ventilation without difficulty LMA: LMA inserted LMA Size: 3.0 Number of attempts: 1 Airway Equipment and Method: Bite block Placement Confirmation: positive ETCO2 and breath sounds checked- equal and bilateral Tube secured with: Tape Dental Injury: Teeth and Oropharynx as per pre-operative assessment

## 2015-07-01 NOTE — H&P (View-Only) (Signed)
Blanch Media A. Suthers 05/29/2015 8:03 AM Location: [facility] Patient #: [id number] DOB: Sep 27, 1965 Undefined / Language: [name] / Race: Black or African American Female  History of Present Illness Stark Klein MD; 05/29/2015 12:03 PM) The patient is a 49 year old female who presents with a pancreatic mass. Patient is a 49 year old female who was admitted to the hospital on August 19 for jaundice. She is referred for consultation by Dr. Burr Medico for new diagnosis of pancreatic/duodenal/ampullary carcinoma. She also had significant itching and dark-colored urine. She complained of right abdominal pain. She has had some weight loss and anorexia. She first received an ultrasound which showed a mass in the common bile duct and dilation of the common bile duct. She was seen over 3.6 cm hypervascular mass in the head of the pancreas. She had an ERCP which showed a mass invading the duodenum and a metallic stent was placed. Pathology is consistent with adenocarcinoma. We reviewed the patient at multidisciplinary cancer conference this week. Although the mass does appear to be consistent with the head of the pancreas, and it could certainly also be duodenal or ampullary. There is tumor tracking up into the common bile duct, which would be sure nearly uncommon for a primary pancreatic cancer. It would be much more common to see that with ampullary or duodenal cancer. Her bilirubin was as high as 27.6. It is down to 8.9. She does get pain when she eats.  She was confused this AM about where to go. This confusion did seem to resolve.  Images and reports are reviewed. These are also discussed with radiology and gastroenterology. Pathology slides are reviewed with the pathologist. MRI/MRCP IMPRESSION: Hypovascular mass in the pancreatic head measuring 3.6 cm, highly suspicious for pancreatic adenocarcinoma. This obstructs the distal common bile duct causing severe diffuse biliary ductal dilatation. No definite  evidence of metastatic disease within the abdomen. Cholelithiasis, without definite signs of acute cholecystitis. CT chest IMPRESSION: 1. No evidence of metastatic disease in the thorax. 2. Mild emphysema. pathology of duodenal biopsies. Duodenum, Biopsy INVASIVE ADENOCARCINOMA, SEE COMMENT.   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 Voltaren (1% Gel, Transdermal as directed) Active. Gold Bond Medicated Body (5-0.15% Lotion, External prn) Active. Ibuprofen (200MG  Tablet, Oral prn) Active. Keppra (1000MG  Tablet, Oral) Active. Multivitamin (Oral daily) Active. Potassium Chloride (20MEQ Tablet ER, Oral daily) Active. TraMADol HCl (50MG  Tablet, Oral prn) Active.  Social History Stark Klein, MD; 05/29/2015 11:35 AM) Tobacco use Current every day smoker. 0.5 ppd times 15 years. Alcohol use Heavy alcohol use, Drinks beer. 20 beers per week Illicit drug use Uses marijuana.  Family History Stark Klein, MD; 05/29/2015 11:35 AM) Seizure disorder Cirrhosis Of Liver Sister.  Review of Systems Stark Klein MD; 05/29/2015 11:34 AM) All other systems negative  Note: positive for anorexia, weight loss, jaundice, abdominal pain, nausea, fatigue, back pain    Vitals Stark Klein MD; 05/29/2015 11:36 AM) 05/29/2015 11:35 AM Weight: 103.2 lb Temp.: 98.46F  Pulse: 74 (Regular)  Resp.: 18 (Unlabored)  P.OX: 100% (Room air) BP: 113/74 (Sitting,  Left Arm, Standard)  Physical Exam Stark Klein MD; 05/29/2015 11:23 AM) General Mental Status-Alert. General Appearance-Consistent with stated age. Hydration-Well hydrated. Voice-Normal.  Head and Neck Head-normocephalic, atraumatic with no lesions or palpable masses. Trachea-midline. Thyroid Gland Characteristics - normal size and consistency.  Eye Eyeball - Bilateral-Extraocular movements intact. Sclera/Conjunctiva - Bilateral-Sclera icteric.  Chest and Lung Exam Chest and lung exam reveals -quiet, even and easy respiratory effort with no use of accessory muscles and on auscultation, normal breath sounds, no adventitious sounds and normal vocal resonance. Inspection Chest Wall - Normal. Back - normal.  Cardiovascular Cardiovascular examination reveals -normal heart sounds, regular rate and rhythm with no murmurs and normal pedal pulses bilaterally.  Abdomen Inspection Inspection of the abdomen reveals - No Hernias. Palpation/Percussion Palpation and Percussion of the abdomen reveal - Soft, Non Tender, No Rebound tenderness, No Rigidity (guarding) and No hepatosplenomegaly. Auscultation Auscultation of the abdomen reveals - Bowel sounds normal.  Neurologic Neurologic evaluation reveals -alert and oriented x 3 with no impairment of recent or remote memory. Mental Status-Normal.  Musculoskeletal Global Assessment -Note: no gross deformities.  Normal Exam - Left-Upper Extremity Strength Normal and Lower Extremity Strength Normal. Normal Exam - Right-Upper Extremity Strength Normal and Lower Extremity Strength Normal.  Lymphatic Head & Neck  General Head & Neck Lymphatics: Bilateral - Description - Normal. Axillary  General Axillary Region: Bilateral - Description - Normal. Tenderness - Non Tender. Femoral & Inguinal  Generalized Femoral & Inguinal Lymphatics: Bilateral - Description - No Generalized  lymphadenopathy.    Assessment & Plan Stark Klein MD; 05/29/2015 12:02 PM) DUODENAL CANCER (152.0  C17.0) Impression: Because of the patient's controversy about the tissue of origin for her diagnosis, I would plan for upfront surgery. She does not appear to have any vascular involvement on any of her imaging studies.   I discussed the surgery with the patient including diagrams of anatomy. I discussed the role of diagnostic laparoscopy. In the case of ampullary cancers, if spread of the disease is found, we will abort the procedure and not proceed with resection. The rationale for this was discussed with the patient. There has not been data to support resection of Stage IV disease in terms of survival benefit.  We discussed possible complications including: Potential of aborting procedure if tumor is invading the superior mesenteric or hepatic arteries Bleeding Infection and possible wound complications such as hernia Damage to adjacent structures Leak of anastamoses, primarily pancreatic Possible need for other procedures Possible prolonged nausea with possible need for external feeding. Possible prolonged hospital stay. Possible development of diabetes or worsening of current diabetes. Possible pancreatic exocrine insufficiency Prolonged fatigue/weakness/appetite Possible early recurrence of cancer   The patient understands and wishes to proceed. The patient has been advised to turn in disability paperwork to our office.  45 min spent in evaluation, examination, counseling, and coordination of care. >50% spent in counseling.  45 min spent in evaluation, examination, counseling, and coordination of care. Current Plans  You are being scheduled for surgery - Our schedulers will call you.  You should hear from our office's scheduling department within 5 working days about the location, date, and time of surgery. We try to make accommodations for patient's preferences in scheduling  surgery, but sometimes the OR schedule or the surgeon's schedule prevents [place] from making those accommodations.  If you have not heard from our office [phone] in 5 working days, call the office and ask for your surgeon's nurse.  If you have other questions about your diagnosis, plan, or  surgery, call the office and ask for your surgeon's nurse. Pt Education - flb whipple pt info TOBACCO ABUSE (305.1  Z72.0) Current Plans Pt Education - Smoking: Ways to Quit: addiction TOBACCO ABUSE COUNSELING (V65.42  Z71.6) ANEMIA OF CHRONIC DISEASE (285.29  D63.8)    Signed by Stark Klein, MD (06/02/2015 10:59 AM)

## 2015-07-01 NOTE — Transfer of Care (Signed)
Immediate Anesthesia Transfer of Care Note  Patient: Shelia Arnold  Procedure(s) Performed: Procedure(s): INSERTION PORT-A-CATH (Left)  Patient Location: PACU  Anesthesia Type:General  Level of Consciousness: awake, alert  and oriented  Airway & Oxygen Therapy: Patient Spontanous Breathing and Patient connected to face mask oxygen  Post-op Assessment: Report given to RN, Post -op Vital signs reviewed and stable and Patient moving all extremities  Post vital signs: Reviewed and stable  Last Vitals:  Filed Vitals:   07/01/15 1639  BP: 143/93  Pulse: 90  Temp:   Resp: 13    Complications: No apparent anesthesia complications

## 2015-07-01 NOTE — Interval H&P Note (Signed)
History and Physical Interval Note:  07/01/2015 2:06 PM  Shelia Arnold  has presented today for surgery, with the diagnosis of Stage IV periampullary cancer (DUODENAL CANCER vs ampullary cancer vs distal bile duct carcinoma).  The various methods of treatment have been discussed with the patient and family. After consideration of risks, benefits and other options for treatment, the patient has consented to  Procedure(s): INSERTION PORT-A-CATH (N/A) as a surgical intervention .  The patient's history has been reviewed, patient examined, no change in status, stable for surgery.  I have reviewed the patient's chart and labs.  Questions were answered to the patient's satisfaction.     Shelia Arnold

## 2015-07-01 NOTE — Op Note (Signed)
PREOPERATIVE DIAGNOSIS:  Stage IV adenocarcinoma      POSTOPERATIVE DIAGNOSIS:  Same     PROCEDURE: left subclavian port placement, Bard Clearvue  Power Port, MRI safe, 8-French.      SURGEON:  Stark Klein, MD      ANESTHESIA:  General   FINDINGS:  Good venous return, easy flush, and tip of the catheter and   SVC 21.5 cm.      SPECIMEN:  None.      ESTIMATED BLOOD LOSS:  Minimal.      COMPLICATIONS:  None known.      PROCEDURE:  Pt was identified in the holding area and taken to   the operating room, where patient was placed supine on the operating room   table.  General anesthesia was induced.  Patient's arms were tucked and the upper   chest and neck were prepped and draped in sterile fashion.  Time-out was   performed according to the surgical safety check list.  When all was   correct, we continued.   Local anesthetic was administered over this   area at the angle of the clavicle.  The vein was accessed with 2 passes of the needle. There was good venous return and the wire passed easily with no ectopy.   Fluoroscopy was used to confirm that the wire was in the vena cava.      The patient was placed back level and the area for the pocket was anethetized   with local anesthetic.  A 3-cm transverse incision was made with a #15   blade.  Cautery was used to divide the subcutaneous tissues down to the   pectoralis muscle.  An [employer] retractor was used to elevate the skin   while a pocket was created on top of the pectoralis fascia.  The port   was placed into the pocket to confirm that it was of adequate size.  The   catheter was preattached to the port.  The port was then secured to the   pectoralis fascia with four 2-0 Prolene sutures.  These were clamped and   not tied down yet.    The catheter was tunneled through to the wire exit   site.  The catheter was placed along the wire to determine what length it should be to be in the SVC.  The catheter was cut at 21.5 cm.   The tunneler sheath and dilator were passed over the wire and the dilator and wire were removed.  The catheter was advanced through the tunneler sheath and the tunneler sheath was pulled away.  Care was taken to keep the catheter in the tunneler sheath as this occurred. This was advanced and the tunneler sheath was removed.  There was good venous   return and easy flush of the catheter.  The Prolene sutures were tied   down to the pectoral fascia.  The skin was reapproximated using 3-0   Vicryl interrupted deep dermal sutures.    Fluoroscopy was used to re-confirm good position of the catheter.  The skin   was then closed using 4-0 Monocryl in a subcuticular fashion.  The port was flushed with concentrated heparin flush as well.  The wounds were then cleaned, dried, and dressed with Dermabond.  The patient was awakened from anesthesia and taken to the PACU in stable condition.  Needle, sponge, and instrument counts were correct.               Stark Klein, MD

## 2015-07-02 ENCOUNTER — Encounter (HOSPITAL_BASED_OUTPATIENT_CLINIC_OR_DEPARTMENT_OTHER): Payer: Self-pay | Admitting: General Surgery

## 2015-07-02 ENCOUNTER — Telehealth: Payer: Self-pay | Admitting: Hematology

## 2015-07-02 NOTE — Telephone Encounter (Signed)
Aware of follow up appointment °

## 2015-07-15 ENCOUNTER — Telehealth: Payer: Self-pay | Admitting: *Deleted

## 2015-07-15 ENCOUNTER — Encounter: Payer: Self-pay | Admitting: Hematology

## 2015-07-15 ENCOUNTER — Other Ambulatory Visit: Payer: Self-pay | Admitting: *Deleted

## 2015-07-15 ENCOUNTER — Telehealth: Payer: Self-pay | Admitting: Hematology

## 2015-07-15 ENCOUNTER — Ambulatory Visit (HOSPITAL_BASED_OUTPATIENT_CLINIC_OR_DEPARTMENT_OTHER): Payer: Medicaid Other

## 2015-07-15 ENCOUNTER — Encounter: Payer: Self-pay | Admitting: *Deleted

## 2015-07-15 ENCOUNTER — Ambulatory Visit (HOSPITAL_BASED_OUTPATIENT_CLINIC_OR_DEPARTMENT_OTHER): Payer: Medicaid Other | Admitting: Hematology

## 2015-07-15 VITALS — BP 122/87 | HR 89 | Temp 98.3°F | Resp 18 | Ht 60.0 in | Wt 102.5 lb

## 2015-07-15 DIAGNOSIS — E876 Hypokalemia: Secondary | ICD-10-CM

## 2015-07-15 DIAGNOSIS — C250 Malignant neoplasm of head of pancreas: Secondary | ICD-10-CM | POA: Diagnosis present

## 2015-07-15 DIAGNOSIS — C259 Malignant neoplasm of pancreas, unspecified: Secondary | ICD-10-CM

## 2015-07-15 DIAGNOSIS — D509 Iron deficiency anemia, unspecified: Secondary | ICD-10-CM | POA: Diagnosis not present

## 2015-07-15 DIAGNOSIS — R17 Unspecified jaundice: Secondary | ICD-10-CM

## 2015-07-15 DIAGNOSIS — C787 Secondary malignant neoplasm of liver and intrahepatic bile duct: Secondary | ICD-10-CM

## 2015-07-15 LAB — CBC WITH DIFFERENTIAL/PLATELET
BASO%: 0.8 % (ref 0.0–2.0)
BASOS ABS: 0.1 10*3/uL (ref 0.0–0.1)
EOS ABS: 0 10*3/uL (ref 0.0–0.5)
EOS%: 0.4 % (ref 0.0–7.0)
HEMATOCRIT: 34 % — AB (ref 34.8–46.6)
HEMOGLOBIN: 10.9 g/dL — AB (ref 11.6–15.9)
LYMPH%: 21.5 % (ref 14.0–49.7)
MCH: 27.4 pg (ref 25.1–34.0)
MCHC: 32.2 g/dL (ref 31.5–36.0)
MCV: 85.2 fL (ref 79.5–101.0)
MONO#: 0.6 10*3/uL (ref 0.1–0.9)
MONO%: 5.1 % (ref 0.0–14.0)
NEUT%: 72.2 % (ref 38.4–76.8)
NEUTROS ABS: 8.3 10*3/uL — AB (ref 1.5–6.5)
Platelets: 469 10*3/uL — ABNORMAL HIGH (ref 145–400)
RBC: 3.99 10*6/uL (ref 3.70–5.45)
RDW: 19.7 % — AB (ref 11.2–14.5)
WBC: 11.5 10*3/uL — AB (ref 3.9–10.3)
lymph#: 2.5 10*3/uL (ref 0.9–3.3)

## 2015-07-15 LAB — COMPREHENSIVE METABOLIC PANEL (CC13)
ALBUMIN: 3.7 g/dL (ref 3.5–5.0)
ALK PHOS: 149 U/L (ref 40–150)
ALT: 26 U/L (ref 0–55)
AST: 28 U/L (ref 5–34)
Anion Gap: 12 mEq/L — ABNORMAL HIGH (ref 3–11)
BILIRUBIN TOTAL: 0.88 mg/dL (ref 0.20–1.20)
BUN: 16.6 mg/dL (ref 7.0–26.0)
CALCIUM: 10.8 mg/dL — AB (ref 8.4–10.4)
CO2: 26 mEq/L (ref 22–29)
Chloride: 101 mEq/L (ref 98–109)
Creatinine: 0.7 mg/dL (ref 0.6–1.1)
Glucose: 94 mg/dl (ref 70–140)
POTASSIUM: 3.1 meq/L — AB (ref 3.5–5.1)
Sodium: 138 mEq/L (ref 136–145)
TOTAL PROTEIN: 7.8 g/dL (ref 6.4–8.3)

## 2015-07-15 MED ORDER — LIDOCAINE-PRILOCAINE 2.5-2.5 % EX CREA: 1.0000 "application " | TOPICAL_CREAM | CUTANEOUS | Status: DC | PRN

## 2015-07-15 NOTE — Telephone Encounter (Signed)
per pof to sch pt appt-sent MW email to sh trmt-adv pt [facility] wi[facility] call to sch scan-gave pt avs-adv to get updated sch @ chemo class on 10/25

## 2015-07-15 NOTE — Progress Notes (Signed)
[place]  [phone] Fax:(336) [phone]  Clinic follow up Note   Patient Care Team: [name], MD as PCP - General Tania Ade, RN as Registered Nurse (Medical Oncology) 07/15/2015  CHIEF COMPLAINTS:  Follow up pancreatic adenocarcinoma    Oncology History   Pancreatic adenocarcinoma [facility])   Staging form: Pancreas, AJCC 7th Edition     Clinical: Stage IV (T3, N0, M1) - Unsigned     Pathologic stage from 06/25/2015: T3, N0 - Unsigned       Pancreatic adenocarcinoma ([place])   05/16/2015 Imaging Abdominal MRI and MRCP showed a hypovascular mass in the pancreatic head measuring 3.6 cm. This obstructs the distal common bile duct causing severe diffuse biliary ductal dilatation.   05/19/2015 Procedure Patient underwent EGD/ERCP and stent placement. A mass in the medial duodenum was found, biopsy was obtained.    05/19/2015 Pathology Results Duodenum mass biopsy showed invasive adenocarcinoma.   05/29/2015 Initial Diagnosis Pancreatic adenocarcinoma ([place])   06/25/2015 Surgery diagnostic laparoscopy showed 6-8 subcapsular liver lesions, liver biopsy showed metastatic adenocarcinoma, Whipple surgery was canceled.   06/25/2015 Pathology Results Liver biopsy showed adenocarcinoma, the morphology was compatible with the initial duodenum biopsy.    HISTORY OF PRESENTING ILLNESS:  Shelia Arnold 49 y.o. female is here because of recently diagnosed pancreatic/duodenal cancer. I initially saw in the hospital, she is here today for follow-up.  She presented with 3-4 week history of jaundice,dark colored urine, as well as pruritus, and was admitted to [facility] on 05/15/2015. SHe also reported right lower and flank pain. She denied nausea or vomiting. She denied any cardiac or respiratory complaints. He appetite was decreased with some weight loss associated with this. On admission, her total bili was elevated at 27.6, as well as high Alk Phos at  575, AST/ALT at 169 and 222 respectively. Abdominal ultrasound on 8/19 showed a possible mass in the CBD. MRCP showed an obstructing, 3.6 cm hypovascular mass in the head of the pancreas obstructing the common bile duct. ERCP with brush biopsy, stent placement was performed on 8/23, with improvement in her liver functions. During the ERCP, a large exophytic mass in the medial duodenum was found, biopsy showed adenocarcinoma.  She developed moderate abdominal pain after the ERCP procedure, mainly in the upper left abdomen, with radiation to mid back. She takes ibuprofen as needed for the pain, which helps. No nausea or vomiting, her bowel movements is slightly constipated.She has lost about 10-15 pounds in the past few months.  INTERIM HISTORY [name] returns for follow-up. She underwent diagnostic laparoscopy on 06/25/2015. Unfortunately multiple subcapsular liver lesions were found and frozen showed metastatic adenocarcinoma. Whipple surgery was aborted, she had a port placed during the hospital stay and was discharged home afterwards. She has recovered well from the surgery, still has mild to moderate incision pain, for which she takes Percocet and tramadol. She is eating well, no nausea, or other new complaints. Her appetite and energy levels are good.   MEDICAL HISTORY:  Past Medical History  Diagnosis Date  . Depression   . Hypertension   . Alcohol abuse   . Headache   . Myocardial infarction [facility]) 1990's?    per pt. many years ago does not see a cardiologist  . 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 Stroke ([place]) 1990's    no residual effects  . Arthritis     "back & legs" (06/25/2015)  . Chronic back pain   .  Seizure [facility])     SURGICAL HISTORY: Past Surgical History  Procedure Laterality Date  . Ercp N/A 05/19/2015    Procedure: ENDOSCOPIC RETROGRADE CHOLANGIOPANCREATOGRAPHY (ERCP);  Surgeon: Teena Irani, MD;  Location: [facility] ENDOSCOPY;  Service: Endoscopy;  Laterality: N/A;  .  Diagnostic laparoscopy  06/25/2015  . Wedge liver biopsy  06/25/2015  . Exploratory laparotomy  06/25/2015  . Tubal ligation  1982  . Cardiac catheterization  1990's?  . Colposcopy vulva w/ biopsy    . Laparoscopy N/A 06/25/2015    Procedure: LAPAROSCOPY DIAGNOSTIC;  Surgeon: Stark Klein, MD;  Location: [place];  Service: General;  Laterality: N/A;  . Whipple procedure N/A 06/25/2015    Procedure: LIVER BIOPSY;  Surgeon: Stark Klein, MD;  Location: [place];  Service: General;  Laterality: N/A;  . Portacath placement Left 07/01/2015    Procedure: INSERTION PORT-A-CATH;  Surgeon: Stark Klein, MD;  Location: [place];  Service: General;  Laterality: Left;    SOCIAL HISTORY: Social History   Social History  . Marital Status: Single    Spouse Name: N/A  . Number of Children: No   . Years of Education: N/A   Occupational History  . On disability due to seizure    Social History Main Topics  . Smoking status: Current Every Day Smoker -- 0.50 packs/day for 15 years    Types: Cigarettes  . Smokeless tobacco: Never Used     Comment: trying to cut back a little each day  . Alcohol Use: 12.0 oz/week    20 Cans of beer per week     Comment: 2 beers a night  05/15/2015 " 4 beers a day"  . Drug Use: No  . Sexual Activity: Not on file   Other Topics Concern  . Not on file   Social History Narrative   Single, does not regularly exercise, and completed the 7th grade.      Mother- dec.- died during childbirth, birth trauma   Brother- epilepsy- dec- 59's   Father- unknown   No known history of cancer in 1st degree family members    FAMILY HISTORY: Family History  Problem Relation Age of Onset  . Cancer Father     unknown type    ALLERGIES:  has No Known Allergies.  MEDICATIONS:  Current Outpatient Prescriptions  Medication Sig Dispense Refill  . amitriptyline (ELAVIL) 25 MG tablet TAKE ONE TABLET BY MOUTH AT BEDTIME 90 tablet 3  . diclofenac (FLECTOR) 1.3 % PTCH  PLACE 1 PATCH ONTO THE SKIN TWICE DAILY 180 patch 3  . Emollient (GOLD BOND MEDICATED BODY) 5-0.15 % LOTN Apply 1 application topically daily as needed (dry skin).    . hydrochlorothiazide (HYDRODIURIL) 25 MG tablet Take 0.5 tablets (12.5 mg total) by mouth daily. (Patient taking differently: Take 25 mg by mouth daily. ) 90 tablet 3  . ibuprofen (ADVIL,MOTRIN) 400 MG tablet Take 1 tablet (400 mg total) by mouth every 6 (six) hours as needed for moderate pain. 90 tablet 1  . levETIRAcetam (KEPPRA) 1000 MG tablet TAKE 1 AND 1/2 TABLETS BY MOUTH EVERY 12 HOURS 270 tablet 0  . Multiple Vitamin (MULTIVITAMIN WITH MINERALS) TABS tablet Take 1 tablet by mouth daily. 90 tablet 9  . oxyCODONE-acetaminophen (PERCOCET/ROXICET) 5-325 MG tablet Take 1 tablet by mouth every 6 (six) hours as needed for severe pain. 30 tablet 0  . polyethylene glycol (MIRALAX / GLYCOLAX) packet Take 17 g by mouth 2 (two) times daily. 14 each 0  .  potassium chloride SA (K-DUR,KLOR-CON) 20 MEQ tablet Take 1 tablet (20 mEq total) by mouth 2 (two) times daily. 60 tablet 0  . Sennosides-Docusate Sodium (SENNA) 8.6-50 MG TABS Take 1 tablet by mouth 2 (two) times daily. 7 each 0  . traMADol (ULTRAM) 50 MG tablet Take 1 tablet (50 mg total) by mouth every 6 (six) hours as needed (Please only give tramadol if ibuprofen does not alleviate her back pain). 30 tablet 0  . VOLTAREN 1 % GEL APPLY 2 GRAMS TOPICALLY QID  0  . lidocaine-prilocaine (EMLA) cream Apply 1 application topically as needed. Apply to portacath 1 1/2 - 2 hours prior to procedures as needed. 30 g 1   No current facility-administered medications for this visit.    REVIEW OF SYSTEMS:   Constitutional: Denies fevers, chills or abnormal night sweats Eyes: Denies blurriness of vision, double vision or watery eyes Ears, nose, mouth, throat, and face: Denies mucositis or sore throat Respiratory: Denies cough, dyspnea or wheezes Cardiovascular: Denies palpitation, chest  discomfort or lower extremity swelling Gastrointestinal:  Denies nausea, heartburn or change in bowel habits Skin: Denies abnormal skin rashes Lymphatics: Denies new lymphadenopathy or easy bruising Neurological:Denies numbness, tingling or new weaknesses Behavioral/Psych: Mood is stable, no new changes  All other systems were reviewed with the patient and are negative.  PHYSICAL EXAMINATION: ECOG PERFORMANCE STATUS: 1 - Symptomatic but completely ambulatory  Filed Vitals:   07/15/15 0953  BP: 122/87  Pulse: 89  Temp: 98.3 F (36.8 C)  Resp: 18   Filed Weights   07/15/15 0953  Weight: 102 lb 8 oz (46.494 kg)    GENERAL:alert, no distress and comfortable SKIN: skin color, texture, turgor are normal, no rashes or significant lesions EYES: normal, conjunctiva are pink and non-injected, sclera clear OROPHARYNX:no exudate, no erythema and lips, buccal mucosa, and tongue normal  NECK: supple, thyroid normal size, non-tender, without nodularity LYMPH:  no palpable lymphadenopathy in the cervical, axillary or inguinal LUNGS: clear to auscultation and percussion with normal breathing effort HEART: regular rate & rhythm and no murmurs and no lower extremity edema ABDOMEN:abdomen soft, non-tender and normal bowel sounds, (+) midline Incision is healing well. Musculoskeletal:no cyanosis of digits and no clubbing  PSYCH: alert & oriented x 3 with fluent speech NEURO: no focal motor/sensory deficits  LABORATORY DATA:  I have reviewed the data as listed CBC Latest Ref Rng 07/15/2015 06/27/2015 06/26/2015  WBC 3.9 - 10.3 10e3/uL 11.5(H) 8.9 7.0  Hemoglobin 11.6 - 15.9 g/dL 10.9(L) 10.7(L) 10.3(L)  Hematocrit 34.8 - 46.6 % 34.0(L) 32.6(L) 31.5(L)  Platelets 145 - 400 10e3/uL 469(H) 368 322    CMP Latest Ref Rng 07/15/2015 06/27/2015 06/26/2015  Glucose 70 - 140 mg/dl 94 146(H) 96  BUN 7.0 - 26.0 mg/dL 16.6 6 5(L)  Creatinine 0.6 - 1.1 mg/dL 0.7 0.53 0.58  Sodium 136 - 145 mEq/L 138 136  136  Potassium 3.5 - 5.1 mEq/L 3.1(L) 3.7 4.6  Chloride 101 - 111 mmol/L - 102 106  CO2 22 - 29 mEq/L 26 26 22   Calcium 8.4 - 10.4 mg/dL 10.8(H) 9.5 8.9  Total Protein 6.4 - 8.3 g/dL 7.8 6.1(L) 5.4(L)  Total Bilirubin 0.20 - 1.20 mg/dL 0.88 1.3(H) 1.8(H)  Alkaline Phos 40 - 150 U/L 149 108 102  AST 5 - 34 U/L 28 71(H) 95(H)  ALT 0 - 55 U/L 26 50 54   PATHOLOGY REPORT Diagnosis 05/19/2015 Duodenum, Biopsy INVASIVE ADENOCARCINOMA, SEE COMMENT. Microscopic Comment The case was reviewed with Dr.  Saralyn Pilar who concurs. The case was discussed with Dr. Amedeo Plenty on 05/20/2015. (CR:kh 05/20/2015)  Liver, biopsy 06/25/2015 - ADENOCARCINOMA. Microscopic Comment The morphologic features are compatible with metastatic duodenal adenocarcinoma.  ERCP 05/19/2015 IMPRESSIONS: malignant-appearing mass invading the duodenal wall. 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  RADIOGRAPHIC STUDIES: I have personally reviewed the radiological images as listed and agreed with the findings in the report.  MR ABD W/WO MRCP 05/16/2015 IMPRESSION: Hypovascular mass in the pancreatic head measuring 3.6 cm, highly suspicious for pancreatic adenocarcinoma. This obstructs the distal common bile duct causing severe diffuse biliary ductal dilatation.  No definite evidence of metastatic disease within the abdomen.  Cholelithiasis, without definite signs of acute cholecystitis.   ASSESSMENT & PLAN: 49 year old African-American female, with past medical history of seizure, hypertension, on disability, presented with jaundice and abdominal pain.  1. 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 -I reviewed her surgical liver biopsy results, unfortunately it confirmed liver metastasis. -This is  likely pancreatic adenocarcinoma with direct invading duodenum, although duodenal adenocarcinoma invading pancreas is also possible, but felt to be less likely. -Her case was re-presented in our GI tumor board earlier this week, and consensus is this is likely a pancreatic primary. -Unfortunately this is incurable disease. The goal of therapy is disease control and prolong life. We discussed the nature of metastatic pancreatic cancer and overall very poor prognosis. She voiced good understanding -I discussed systemic chemotherapy options. Given her young age, good baseline health, and good recovery from her surgery, I'll offer her FOLFIRINOX, including 5-FU, oxaliplatin and irinotecan, every 2 weeks. The overall response rate with this regimen is in the 30-40% range. We also discussed less intensive regimen, such as gemcitabine with or without Abraxane, or FOLFOX --Chemotherapy consent: Side effects including but does not not limited to, fatigue, nausea, vomiting, diarrhea, hair loss, neuropathy, fluid retention, renal and kidney dysfunction, neutropenic fever, needed for blood transfusion, bleeding, were discussed with patient in great detail. She agrees to proceed. -I'll tentatively schedule her first chemotherapy in 2 weeks, give her more time to recover better from her surgery.   2. Anemia - there are study showed significantly elevated ferritin, slightly low serum iron m and low transferrin saturation.  -She has iron deficient anemia, also component of anemia of malignancy. -I will consider IV feraheme, anticipating her anemia will get worse from chemotherapy  3. Obstructive jaundice, status post CBD stenting -Her repeated his bilirubin is normal today   4. Hypokalemia -Potassium 3.1 today. She will increase her by mouth potassium.  5. Seizure -Continue antiseizure medication  PLAN -repeat staging CT CAP  -chemo class -RTC in 2 weeks for first cycle FOLFIRINOX   All questions were  answered. The patient knows to call the clinic with any problems, questions or concerns. I spent 30 minutes counseling the patient face to face. The total time spent in the appointment was 40 minutes and more than 50% was on counseling.     Truitt Merle, MD 07/16/2015 6:04 PM

## 2015-07-15 NOTE — Telephone Encounter (Signed)
Per staff message and POF I have scheduled appts. Advised scheduler of appts. JMW  

## 2015-07-15 NOTE — Progress Notes (Signed)
Oncology Nurse Navigator Documentation  Oncology Nurse Navigator Flowsheets 07/15/2015  Referral date to RadOnc/MedOnc -  Navigator Encounter Type Office visit follow up  Patient Visit Type Medonc  Treatment Phase Treatment planning s/p surgery  Barriers/Navigation Needs Family concerns;Education  Education Understanding Cancer/ Treatment Options;Coping with Diagnosis/ Prognosis;Pain/ Symptom Management;Preparing for Treatment  Interventions Referrals  Referrals Nutrition/dietician  Education Method Verbal-will go to chemo class  Support Groups/Services -  Time Spent with Patient 30  Has transportation with 72 hour notice and on occasion her friend can drive her. Has no children, only relative support is her aunt and she is not very well. Able to maintain her home and perform ADLs. Goes to grocery store when she can get a ride. Reassured her we will try to arrange appointments to limit the # of times she needs to get transportation assistance. Will try to schedule her chemo class same day as CT scan. Request for dietician to see her to improve her nutrition. Encouraged her that we will help her get through this treatment.

## 2015-07-16 LAB — CANCER ANTIGEN 19-9: CA 19 9: 5.8 U/mL (ref <35.0)

## 2015-07-17 ENCOUNTER — Telehealth: Payer: Self-pay | Admitting: *Deleted

## 2015-07-17 ENCOUNTER — Other Ambulatory Visit: Payer: Self-pay | Admitting: Internal Medicine

## 2015-07-17 MED ORDER — VOLTAREN 1 % TD GEL: 2.0000 g | Freq: Four times a day (QID) | TRANSDERMAL | Status: AC

## 2015-07-17 NOTE — Telephone Encounter (Signed)
  Oncology Nurse Navigator Documentation    Navigator Encounter Type: Telephone (07/17/15 1520): Attempted to reach Shelia Arnold to try to offer to move her 10/25 chemo class and nutrition appointment to same day of her CT scan. Unable to leave message-no identification of whose # was called

## 2015-07-17 NOTE — Telephone Encounter (Signed)
Needs early Dec appt Dr Benjamine Mola - routine F/U

## 2015-07-17 NOTE — Telephone Encounter (Signed)
Pt requesting voltaren gel to be filled @ [facility] on [address].

## 2015-07-21 ENCOUNTER — Other Ambulatory Visit: Payer: Self-pay | Admitting: Internal Medicine

## 2015-07-21 ENCOUNTER — Ambulatory Visit: Payer: Medicaid Other | Admitting: Nutrition

## 2015-07-21 ENCOUNTER — Other Ambulatory Visit: Payer: Medicaid Other

## 2015-07-21 ENCOUNTER — Encounter: Payer: Self-pay | Admitting: *Deleted

## 2015-07-21 DIAGNOSIS — K8689 Other specified diseases of pancreas: Secondary | ICD-10-CM

## 2015-07-21 MED ORDER — OXYCODONE-ACETAMINOPHEN 5-325 MG PO TABS: 1.0000 | ORAL_TABLET | Freq: Four times a day (QID) | ORAL | Status: AC | PRN

## 2015-07-21 NOTE — Telephone Encounter (Signed)
PT NEEDS REFILL ON PAIN MEDICATION, OXYCODONE 25MG 

## 2015-07-21 NOTE — Progress Notes (Signed)
Patient presents to nutrition follow-up.  Patient is being treated for pancreas cancer. She reports she is eating better. She is trying to drink Ensure Plus 3 times a day but continues to have difficulty affording this product. Patient reports constipation has resolved. Weight increased and documented as 104.6 pounds today up from 103 pounds.  Nutrition diagnosis: Malnutrition improved.  Intervention: Reviewed importance of adequate calories and protein in small frequent meals and snacks. Provided second complimentary case of Ensure Plus along with coupons. Enforced options for low cost foods. Teach back method was used.  Monitoring, evaluation, goals: Patient will tolerate adequate calories and protein to minimize weight loss.  Next visit: Wednesday, November 16, during chemotherapy.  **Disclaimer: This note was dictated with voice recognition software. Similar sounding words can inadvertently be transcribed and this note may contain transcription errors which may not have been corrected upon publication of note.**

## 2015-07-21 NOTE — Progress Notes (Unsigned)
Spent time with patient in chemo class reviewing side effects of Camptosar, 5FU and Leucovorin See education notes.

## 2015-07-21 NOTE — Telephone Encounter (Signed)
Called to inform patient rx is ready

## 2015-07-21 NOTE — Telephone Encounter (Signed)
Last refill 9/28 Last office visit 09/07 Last UDS 08/19 No future appointments

## 2015-07-23 ENCOUNTER — Encounter (HOSPITAL_COMMUNITY): Payer: Self-pay

## 2015-07-23 ENCOUNTER — Ambulatory Visit (HOSPITAL_COMMUNITY)
Admission: RE | Admit: 2015-07-23 | Discharge: 2015-07-23 | Disposition: A | Discharge status: Home / Self Care | Payer: Medicaid Other | Source: Ambulatory Visit | Attending: Hematology | Admitting: Hematology

## 2015-07-23 DIAGNOSIS — R918 Other nonspecific abnormal finding of lung field: Secondary | ICD-10-CM | POA: Insufficient documentation

## 2015-07-23 DIAGNOSIS — C787 Secondary malignant neoplasm of liver and intrahepatic bile duct: Secondary | ICD-10-CM | POA: Diagnosis present

## 2015-07-23 DIAGNOSIS — K831 Obstruction of bile duct: Secondary | ICD-10-CM | POA: Insufficient documentation

## 2015-07-23 DIAGNOSIS — Z9689 Presence of other specified functional implants: Secondary | ICD-10-CM | POA: Diagnosis not present

## 2015-07-23 DIAGNOSIS — Z08 Encounter for follow-up examination after completed treatment for malignant neoplasm: Secondary | ICD-10-CM | POA: Diagnosis not present

## 2015-07-23 MED ORDER — IOHEXOL 300 MG/ML  SOLN
100.0000 mL | Freq: Once | INTRAMUSCULAR | Status: AC | PRN
  Administered 2015-07-23: 80 mL via INTRAVENOUS

## 2015-07-29 ENCOUNTER — Telehealth: Payer: Self-pay | Admitting: Hematology

## 2015-07-29 ENCOUNTER — Ambulatory Visit (HOSPITAL_BASED_OUTPATIENT_CLINIC_OR_DEPARTMENT_OTHER): Payer: Medicaid Other

## 2015-07-29 ENCOUNTER — Ambulatory Visit (HOSPITAL_BASED_OUTPATIENT_CLINIC_OR_DEPARTMENT_OTHER): Payer: Medicaid Other | Admitting: Hematology

## 2015-07-29 ENCOUNTER — Encounter: Payer: Self-pay | Admitting: Hematology

## 2015-07-29 ENCOUNTER — Other Ambulatory Visit (HOSPITAL_BASED_OUTPATIENT_CLINIC_OR_DEPARTMENT_OTHER): Payer: Medicaid Other

## 2015-07-29 VITALS — BP 127/82 | HR 102 | Temp 98.8°F | Resp 18 | Ht 60.0 in | Wt 102.4 lb

## 2015-07-29 DIAGNOSIS — D630 Anemia in neoplastic disease: Secondary | ICD-10-CM

## 2015-07-29 DIAGNOSIS — R109 Unspecified abdominal pain: Secondary | ICD-10-CM

## 2015-07-29 DIAGNOSIS — C787 Secondary malignant neoplasm of liver and intrahepatic bile duct: Secondary | ICD-10-CM

## 2015-07-29 DIAGNOSIS — D509 Iron deficiency anemia, unspecified: Secondary | ICD-10-CM

## 2015-07-29 DIAGNOSIS — R569 Unspecified convulsions: Secondary | ICD-10-CM

## 2015-07-29 DIAGNOSIS — C259 Malignant neoplasm of pancreas, unspecified: Secondary | ICD-10-CM

## 2015-07-29 DIAGNOSIS — Z5111 Encounter for antineoplastic chemotherapy: Secondary | ICD-10-CM | POA: Diagnosis present

## 2015-07-29 DIAGNOSIS — Z720 Tobacco use: Secondary | ICD-10-CM

## 2015-07-29 LAB — COMPREHENSIVE METABOLIC PANEL (CC13)
ALT: 24 U/L (ref 0–55)
AST: 51 U/L — AB (ref 5–34)
Albumin: 3.6 g/dL (ref 3.5–5.0)
Alkaline Phosphatase: 135 U/L (ref 40–150)
Anion Gap: 10 mEq/L (ref 3–11)
BILIRUBIN TOTAL: 0.54 mg/dL (ref 0.20–1.20)
BUN: 16.6 mg/dL (ref 7.0–26.0)
CO2: 24 meq/L (ref 22–29)
CREATININE: 0.7 mg/dL (ref 0.6–1.1)
Calcium: 10.9 mg/dL — ABNORMAL HIGH (ref 8.4–10.4)
Chloride: 104 mEq/L (ref 98–109)
EGFR: >90 mL/min/{1.73_m2} (ref >90)
GLUCOSE: 95 mg/dL (ref 70–140)
Potassium: 3.6 mEq/L (ref 3.5–5.1)
SODIUM: 137 meq/L (ref 136–145)
Total Protein: 7.7 g/dL (ref 6.4–8.3)

## 2015-07-29 LAB — CBC WITH DIFFERENTIAL/PLATELET
BASO%: 0.3 % (ref 0.0–2.0)
Basophils Absolute: 0 10*3/uL (ref 0.0–0.1)
EOS%: 0.3 % (ref 0.0–7.0)
Eosinophils Absolute: 0 10*3/uL (ref 0.0–0.5)
HCT: 32.4 % — ABNORMAL LOW (ref 34.8–46.6)
HGB: 10.6 g/dL — ABNORMAL LOW (ref 11.6–15.9)
LYMPH%: 22.8 % (ref 14.0–49.7)
MCH: 26.4 pg (ref 25.1–34.0)
MCHC: 32.7 g/dL (ref 31.5–36.0)
MCV: 80.6 fL (ref 79.5–101.0)
MONO#: 0.6 10*3/uL (ref 0.1–0.9)
MONO%: 5.9 % (ref 0.0–14.0)
NEUT%: 70.7 % (ref 38.4–76.8)
NEUTROS ABS: 7 10*3/uL — AB (ref 1.5–6.5)
PLATELETS: 277 10*3/uL (ref 145–400)
RBC: 4.02 10*6/uL (ref 3.70–5.45)
RDW: 18.4 % — ABNORMAL HIGH (ref 11.2–14.5)
WBC: 9.9 10*3/uL (ref 3.9–10.3)
lymph#: 2.3 10*3/uL (ref 0.9–3.3)

## 2015-07-29 MED ORDER — LEUCOVORIN CALCIUM INJECTION 350 MG
400.0000 mg/m2 | Freq: Once | INTRAVENOUS | Status: AC
  Administered 2015-07-29: 560 mg via INTRAVENOUS
  Filled 2015-07-29: qty 28

## 2015-07-29 MED ORDER — LOPERAMIDE HCL 2 MG PO TABS: ORAL_TABLET | ORAL | Status: DC

## 2015-07-29 MED ORDER — FLUOROURACIL CHEMO INJECTION 2.5 GM/50ML
400.0000 mg/m2 | Freq: Once | INTRAVENOUS | Status: AC
  Administered 2015-07-29: 550 mg via INTRAVENOUS
  Filled 2015-07-29: qty 11

## 2015-07-29 MED ORDER — ATROPINE SULFATE 1 MG/ML IJ SOLN
0.5000 mg | Freq: Once | INTRAMUSCULAR | Status: AC | PRN
  Administered 2015-07-29: 0.5 mg via INTRAVENOUS

## 2015-07-29 MED ORDER — DEXTROSE 5 % IV SOLN
Freq: Once | INTRAVENOUS | Status: AC
  Administered 2015-07-29: 12:00:00 via INTRAVENOUS

## 2015-07-29 MED ORDER — OXYCODONE HCL 5 MG PO TABS: 5.0000 mg | ORAL_TABLET | ORAL | Status: DC | PRN

## 2015-07-29 MED ORDER — HEPARIN SOD (PORK) LOCK FLUSH 100 UNIT/ML IV SOLN
500.0000 [IU] | Freq: Once | INTRAVENOUS | Status: DC | PRN
  Filled 2015-07-29: qty 5

## 2015-07-29 MED ORDER — SODIUM CHLORIDE 0.9 % IJ SOLN
10.0000 mL | INTRAMUSCULAR | Status: DC | PRN
  Filled 2015-07-29: qty 10

## 2015-07-29 MED ORDER — IRINOTECAN HCL CHEMO INJECTION 100 MG/5ML
180.0000 mg/m2 | Freq: Once | INTRAVENOUS | Status: AC
  Administered 2015-07-29: 252 mg via INTRAVENOUS
  Filled 2015-07-29: qty 12.6

## 2015-07-29 MED ORDER — SODIUM CHLORIDE 0.9 % IV SOLN
2400.0000 mg/m2 | INTRAVENOUS | Status: DC
  Administered 2015-07-29: 3350 mg via INTRAVENOUS
  Filled 2015-07-29: qty 67

## 2015-07-29 MED ORDER — OXALIPLATIN CHEMO INJECTION 100 MG/20ML
85.0000 mg/m2 | Freq: Once | INTRAVENOUS | Status: AC
  Administered 2015-07-29: 120 mg via INTRAVENOUS
  Filled 2015-07-29: qty 24

## 2015-07-29 MED ORDER — ONDANSETRON HCL 8 MG PO TABS: 8.0000 mg | ORAL_TABLET | Freq: Two times a day (BID) | ORAL | Status: DC

## 2015-07-29 MED ORDER — LIDOCAINE-PRILOCAINE 2.5-2.5 % EX CREA: 1.0000 "application " | TOPICAL_CREAM | CUTANEOUS | Status: AC | PRN

## 2015-07-29 MED ORDER — ATROPINE SULFATE 1 MG/ML IJ SOLN
INTRAMUSCULAR | Status: AC
  Filled 2015-07-29: qty 1

## 2015-07-29 MED ORDER — OXYCODONE HCL ER 10 MG PO T12A: 10.0000 mg | EXTENDED_RELEASE_TABLET | Freq: Two times a day (BID) | ORAL | Status: DC

## 2015-07-29 MED ORDER — SODIUM CHLORIDE 0.9 % IV SOLN
Freq: Once | INTRAVENOUS | Status: AC
  Administered 2015-07-29: 12:00:00 via INTRAVENOUS
  Filled 2015-07-29: qty 8

## 2015-07-29 NOTE — Progress Notes (Signed)
[place]  Telephone:([phone] Fax:([phone]) [phone]  Clinic follow up Note   Patient Care Team: [name], MD as PCP - General Tania Ade, RN as Registered Nurse (Medical Oncology) 07/29/2015   CHIEF COMPLAINTS:  Follow up pancreatic adenocarcinoma    Oncology History   Pancreatic adenocarcinoma [facility])   Staging form: Pancreas, AJCC 7th Edition     Clinical: Stage IV (T3, N0, M1) - Unsigned     Pathologic stage from 06/25/2015: Stage IV (T3, N0, M1) - Unsigned     Pathologic: No stage assigned - Unsigned       Pancreatic adenocarcinoma ([place])   05/16/2015 Imaging Abdominal MRI and MRCP showed a hypovascular mass in the pancreatic head measuring 3.6 cm. This obstructs the distal common bile duct causing severe diffuse biliary ductal dilatation.   05/19/2015 Procedure Patient underwent EGD/ERCP and stent placement. A mass in the medial duodenum was found, biopsy was obtained.    05/19/2015 Pathology Results Duodenum mass biopsy showed invasive adenocarcinoma.   05/29/2015 Initial Diagnosis Pancreatic adenocarcinoma ([place])   06/25/2015 Surgery diagnostic laparoscopy showed 6-8 subcapsular liver lesions, liver biopsy showed metastatic adenocarcinoma, Whipple surgery was canceled.   06/25/2015 Pathology Results Liver biopsy showed adenocarcinoma, the morphology was compatible with the initial duodenum biopsy.   07/29/2015 -  Chemotherapy FOLFIRINOX, every 2 weeks with neulasta on day 3     HISTORY OF PRESENTING ILLNESS:  Shelia Arnold 49 y.o. female is here because of recently diagnosed pancreatic/duodenal cancer. I initially saw in the hospital, she is here today for follow-up.  She presented with 3-4 week history of jaundice,dark colored urine, as well as pruritus, and was admitted to [facility] on 05/15/2015. SHe also reported right lower and flank pain. She denied nausea or vomiting. She denied any cardiac or respiratory complaints. He appetite  was decreased with some weight loss associated with this. On admission, her total bili was elevated at 27.6, as well as high Alk Phos at 575, AST/ALT at 169 and 222 respectively. Abdominal ultrasound on 8/19 showed a possible mass in the CBD. MRCP showed an obstructing, 3.6 cm hypovascular mass in the head of the pancreas obstructing the common bile duct. ERCP with brush biopsy, stent placement was performed on 8/23, with improvement in her liver functions. During the ERCP, a large exophytic mass in the medial duodenum was found, biopsy showed adenocarcinoma.  She developed moderate abdominal pain after the ERCP procedure, mainly in the upper left abdomen, with radiation to mid back. She takes ibuprofen as needed for the pain, which helps. No nausea or vomiting, her bowel movements is slightly constipated.She has lost about 10-15 pounds in the past few months.  INTERIM HISTORY Shelia Arnold returns for follow-up. Her main complains is her abdominal pain, which she rates at 7-8 sometime, it's persistent, located in the mid abdomen, she takes Percocet every 4-5 hours. she denies significant nausea or change of her bowel habits. Her appetite and energy level are moderate, she functions well at home. Her weight has stable, she drinks boost 1 can a day.  MEDICAL HISTORY:  Past Medical History  Diagnosis Date  . Depression   . Hypertension   . Alcohol abuse   . Headache   . Myocardial infarction [facility]) 1990's?    per pt. many years ago does not see a cardiologist  . 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 Stroke ([place]) 1990's    no residual effects  .  Arthritis     "back & legs" (06/25/2015)  . Chronic back pain   . Seizure [facility])     SURGICAL HISTORY: Past Surgical History  Procedure Laterality Date  . Ercp N/A 05/19/2015    Procedure: ENDOSCOPIC RETROGRADE CHOLANGIOPANCREATOGRAPHY (ERCP);  Surgeon: Teena Irani, MD;  Location: [facility] ENDOSCOPY;  Service: Endoscopy;  Laterality: N/A;  . Diagnostic  laparoscopy  06/25/2015  . Wedge liver biopsy  06/25/2015  . Exploratory laparotomy  06/25/2015  . Tubal ligation  1982  . Cardiac catheterization  1990's?  . Colposcopy vulva w/ biopsy    . Laparoscopy N/A 06/25/2015    Procedure: LAPAROSCOPY DIAGNOSTIC;  Surgeon: Stark Klein, MD;  Location: [place];  Service: General;  Laterality: N/A;  . Whipple procedure N/A 06/25/2015    Procedure: LIVER BIOPSY;  Surgeon: Stark Klein, MD;  Location: [place];  Service: General;  Laterality: N/A;  . Portacath placement Left 07/01/2015    Procedure: INSERTION PORT-A-CATH;  Surgeon: Stark Klein, MD;  Location: [place];  Service: General;  Laterality: Left;    SOCIAL HISTORY: Social History   Social History  . Marital Status: Single    Spouse Name: N/A  . Number of Children: No   . Years of Education: N/A   Occupational History  . On disability due to seizure    Social History Main Topics  . Smoking status: Current Every Day Smoker -- 0.50 packs/day for 15 years    Types: Cigarettes  . Smokeless tobacco: Never Used     Comment: trying to cut back a little each day  . Alcohol Use: 12.0 oz/week    20 Cans of beer per week     Comment: 2 beers a night  05/15/2015 " 4 beers a day"  . Drug Use: No  . Sexual Activity: Not on file   Other Topics Concern  . Not on file   Social History Narrative   Single, does not regularly exercise, and completed the 7th grade.      Mother- dec.- died during childbirth, birth trauma   Brother- epilepsy- dec- 8's   Father- unknown   No known history of cancer in 1st degree family members    FAMILY HISTORY: Family History  Problem Relation Age of Onset  . Cancer Father     unknown type    ALLERGIES:  has No Known Allergies.  MEDICATIONS:  Current Outpatient Prescriptions  Medication Sig Dispense Refill  . amitriptyline (ELAVIL) 25 MG tablet TAKE ONE TABLET BY MOUTH AT BEDTIME 90 tablet 3  . diclofenac (FLECTOR) 1.3 % PTCH PLACE 1 PATCH  ONTO THE SKIN TWICE DAILY 180 patch 3  . Emollient (GOLD BOND MEDICATED BODY) 5-0.15 % LOTN Apply 1 application topically daily as needed (dry skin).    . hydrochlorothiazide (HYDRODIURIL) 25 MG tablet Take 0.5 tablets (12.5 mg total) by mouth daily. (Patient taking differently: Take 25 mg by mouth daily. ) 90 tablet 3  . ibuprofen (ADVIL,MOTRIN) 400 MG tablet Take 1 tablet (400 mg total) by mouth every 6 (six) hours as needed for moderate pain. 90 tablet 1  . levETIRAcetam (KEPPRA) 1000 MG tablet TAKE 1 AND 1/2 TABLETS BY MOUTH EVERY 12 HOURS 270 tablet 0  . lidocaine-prilocaine (EMLA) cream Apply 1 application topically as needed. Apply to portacath 1 1/2 - 2 hours prior to procedures as needed. 30 g 3  . loperamide (IMODIUM A-D) 2 MG tablet Take 2 at onset of diarrhea, then 1 every 2hrs until 12hr  without a BM. May take 2 tab every 4hrs at bedtime. If diarrhea recurs repeat. 100 tablet 1  . Multiple Vitamin (MULTIVITAMIN WITH MINERALS) TABS tablet Take 1 tablet by mouth daily. 90 tablet 9  . ondansetron (ZOFRAN) 8 MG tablet Take 1 tablet (8 mg total) by mouth 2 (two) times daily. Start the day after chemo for 3 days. Then take as needed for nausea or vomiting. 30 tablet 1  . oxyCODONE (OXY IR/ROXICODONE) 5 MG immediate release tablet Take 1 tablet (5 mg total) by mouth every 4 (four) hours as needed for severe pain. 60 tablet 0  . oxyCODONE (OXYCONTIN) 10 mg 12 hr tablet Take 1 tablet (10 mg total) by mouth every 12 (twelve) hours. 60 tablet 0  . oxyCODONE-acetaminophen (PERCOCET/ROXICET) 5-325 MG tablet Take 1 tablet by mouth every 6 (six) hours as needed for severe pain. 30 tablet 0  . polyethylene glycol (MIRALAX / GLYCOLAX) packet Take 17 g by mouth 2 (two) times daily. 14 each 0  . potassium chloride SA (K-DUR,KLOR-CON) 20 MEQ tablet Take 1 tablet (20 mEq total) by mouth 2 (two) times daily. 60 tablet 0  . Sennosides-Docusate Sodium (SENNA) 8.6-50 MG TABS Take 1 tablet by mouth 2 (two) times  daily. 7 each 0  . traMADol (ULTRAM) 50 MG tablet Take 1 tablet (50 mg total) by mouth every 6 (six) hours as needed (Please only give tramadol if ibuprofen does not alleviate her back pain). 30 tablet 0  . VOLTAREN 1 % GEL Apply 2 g topically 4 (four) times daily. 3 Tube 1   No current facility-administered medications for this visit.    REVIEW OF SYSTEMS:   Constitutional: Denies fevers, chills or abnormal night sweats Eyes: Denies blurriness of vision, double vision or watery eyes Ears, nose, mouth, throat, and face: Denies mucositis or sore throat Respiratory: Denies cough, dyspnea or wheezes Cardiovascular: Denies palpitation, chest discomfort or lower extremity swelling Gastrointestinal:  Denies nausea, heartburn or change in bowel habits Skin: Denies abnormal skin rashes Lymphatics: Denies new lymphadenopathy or easy bruising Neurological:Denies numbness, tingling or new weaknesses Behavioral/Psych: Mood is stable, no new changes  All other systems were reviewed with the patient and are negative.  PHYSICAL EXAMINATION: ECOG PERFORMANCE STATUS: 1 - Symptomatic but completely ambulatory  Filed Vitals:   07/29/15 0928  BP: 127/82  Pulse: 102  Temp: 98.8 F (37.1 C)  Resp: 18   Filed Weights   07/29/15 0928  Weight: 102 lb 6.4 oz (46.448 kg)    GENERAL:alert, no distress and comfortable SKIN: skin color, texture, turgor are normal, no rashes or significant lesions EYES: normal, conjunctiva are pink and non-injected, sclera clear OROPHARYNX:no exudate, no erythema and lips, buccal mucosa, and tongue normal  NECK: supple, thyroid normal size, non-tender, without nodularity LYMPH:  no palpable lymphadenopathy in the cervical, axillary or inguinal LUNGS: clear to auscultation and percussion with normal breathing effort HEART: regular rate & rhythm and no murmurs and no lower extremity edema ABDOMEN:abdomen soft, mild tenderness in epigastric area, normal bowel sounds, (+)  midline Incision has well healed.  Musculoskeletal:no cyanosis of digits and no clubbing  PSYCH: alert & oriented x 3 with fluent speech NEURO: no focal motor/sensory deficits  LABORATORY DATA:  I have reviewed the data as listed CBC Latest Ref Rng 07/29/2015 07/15/2015 06/27/2015  WBC 3.9 - 10.3 10e3/uL 9.9 11.5(H) 8.9  Hemoglobin 11.6 - 15.9 g/dL 10.6(L) 10.9(L) 10.7(L)  Hematocrit 34.8 - 46.6 % 32.4(L) 34.0(L) 32.6(L)  Platelets 145 -  400 10e3/uL 277 469(H) 368    CMP Latest Ref Rng 07/29/2015 07/15/2015 06/27/2015  Glucose 70 - 140 mg/dl 95 94 146(H)  BUN 7.0 - 26.0 mg/dL 16.6 16.6 6  Creatinine 0.6 - 1.1 mg/dL 0.7 0.7 0.53  Sodium 136 - 145 mEq/L 137 138 136  Potassium 3.5 - 5.1 mEq/L 3.6 3.1(L) 3.7  Chloride 101 - 111 mmol/L - - 102  CO2 22 - 29 mEq/L 24 26 26   Calcium 8.4 - 10.4 mg/dL 10.9(H) 10.8(H) 9.5  Total Protein 6.4 - 8.3 g/dL 7.7 7.8 6.1(L)  Total Bilirubin 0.20 - 1.20 mg/dL 0.54 0.88 1.3(H)  Alkaline Phos 40 - 150 U/L 135 149 108  AST 5 - 34 U/L 51(H) 28 71(H)  ALT 0 - 55 U/L 24 26 50   PATHOLOGY REPORT Diagnosis 05/19/2015 Duodenum, Biopsy INVASIVE ADENOCARCINOMA, SEE COMMENT. Microscopic Comment The case was reviewed with Dr. Saralyn Pilar who concurs. The case was discussed with Dr. Amedeo Plenty on 05/20/2015. (CR:kh 05/20/2015)  Liver, biopsy 06/25/2015 - ADENOCARCINOMA. Microscopic Comment The morphologic features are compatible with metastatic duodenal adenocarcinoma.  ERCP 05/19/2015 IMPRESSIONS: malignant-appearing mass invading the duodenal wall. 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  RADIOGRAPHIC STUDIES: I have personally reviewed the radiological images as listed and agreed with the findings in the report.  CT chest, abdomen and pelvis w contrast 07/23/2015  IMPRESSION: 1. Multiple hepatic metastasis are now easily identified on  contrast CT. Increased conspicuity indicates a positive treatment effect versus progression of disease. 2. No significant change in pancreatic head mass size. No evidence of progression. 3. Interval placement of a common bile duct stent with relief of the biliary obstruction. 4. No evidence of peritoneal metastasis. 5. Several 1-2 mm pulmonary nodules (approximately 3) are not seen on prior is indeterminate finding. This could be related to slice selection and very small nodules. Recommend attention on follow-up.   ASSESSMENT & PLAN: 49 year old African-American female, with past medical history of seizure, hypertension, on disability, presented with jaundice and abdominal pain.  1. 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 -I reviewed her surgical liver biopsy results, unfortunately it confirmed liver metastasis. -I reviewed her restaging CT scan images with her, which showed multiple liver metastasis. No other new distant metastasis.  -This is likely pancreatic adenocarcinoma with direct invading duodenum, although duodenal adenocarcinoma invading pancreas is also possible, but felt to be less likely. -Her case was re-presented in our GI tumor board earlier this week, and consensus is this is likely a pancreatic primary. -Unfortunately this is incurable disease. The goal of therapy is disease control and prolong life. We discussed the nature of metastatic pancreatic cancer and overall very poor prognosis. She voiced good understanding -She is starting the first cycle FOLFIRINOX today, main side effects were reviewed with her again, she participated chemo class -I will titrate her pain meds, and I encouraged her to take nutritional supplements.   2. Abdominal pain  -Likely related to her pancreatic cancer. -I'll change her Percocet to oxycodone 5 mg every 4 hours as needed,  and at OxyContin 10 mg every 12 hours. Side effects especially constipation were reviewed with her.  2. Anemia - there are study showed significantly elevated ferritin, slightly low serum iron m and low transferrin saturation.  -She has iron deficient anemia, also component of anemia of  malignancy. -I arranged IV feraheme this week, anticipating her anemia will get worse from chemotherapy  3. Obstructive jaundice, status post CBD stenting -Her repeated his bilirubin is normal now   4. Hypokalemia -resolved, Potassium 3.6 today. Continue by mouth potassium.  5. Seizure -Continue antiseizure medication  PLAN -first cycle FOLFIRINOX today with neulasta on day 3 -I will see her back next week for toxicity check up -I gave her new prescription of oxycodone and oxycontin    All questions were answered. The patient knows to call the clinic with any problems, questions or concerns. I spent 25 minutes counseling the patient face to face. The total time spent in the appointment was 30 minutes and more than 50% was on counseling.     Truitt Merle, MD 07/29/2015 9:58 PM

## 2015-07-29 NOTE — Telephone Encounter (Signed)
Gave and printed appt sched and avs fo rpt; for NOV  °

## 2015-07-29 NOTE — Patient Instructions (Signed)
[facility] Discharge Instructions for Patients Receiving Chemotherapy  Today you received the following chemotherapy agents Folfox  To help prevent nausea and vomiting after your treatment, we encourage you to take your nausea medication as directed   If you develop nausea and vomiting that is not controlled by your nausea medication, call the clinic.   BELOW ARE SYMPTOMS THAT SHOULD BE REPORTED IMMEDIATELY:  *FEVER GREATER THAN 100.5 F  *CHILLS WITH OR WITHOUT FEVER  NAUSEA AND VOMITING THAT IS NOT CONTROLLED WITH YOUR NAUSEA MEDICATION  *UNUSUAL SHORTNESS OF BREATH  *UNUSUAL BRUISING OR BLEEDING  TENDERNESS IN MOUTH AND THROAT WITH OR WITHOUT PRESENCE OF ULCERS  *URINARY PROBLEMS  *BOWEL PROBLEMS  UNUSUAL RASH Items with * indicate a potential emergency and should be followed up as soon as possible.  Feel free to call the clinic you have any questions or concerns. The clinic phone number is [phone].  Please show the [place] at check-in to the Emergency Department and triage nurse.

## 2015-07-31 ENCOUNTER — Ambulatory Visit (HOSPITAL_BASED_OUTPATIENT_CLINIC_OR_DEPARTMENT_OTHER): Payer: Medicaid Other

## 2015-07-31 ENCOUNTER — Ambulatory Visit: Payer: Medicaid Other

## 2015-07-31 VITALS — BP 141/81 | HR 110 | Temp 99.0°F | Resp 17

## 2015-07-31 DIAGNOSIS — C259 Malignant neoplasm of pancreas, unspecified: Secondary | ICD-10-CM

## 2015-07-31 DIAGNOSIS — C787 Secondary malignant neoplasm of liver and intrahepatic bile duct: Secondary | ICD-10-CM | POA: Diagnosis not present

## 2015-07-31 MED ORDER — SODIUM CHLORIDE 0.9 % IJ SOLN
10.0000 mL | INTRAMUSCULAR | Status: DC | PRN
  Administered 2015-07-31: 10 mL
  Filled 2015-07-31: qty 10

## 2015-07-31 MED ORDER — HEPARIN SOD (PORK) LOCK FLUSH 100 UNIT/ML IV SOLN
500.0000 [IU] | Freq: Once | INTRAVENOUS | Status: AC | PRN
  Administered 2015-07-31: 500 [IU]
  Filled 2015-07-31: qty 5

## 2015-07-31 MED ORDER — PEGFILGRASTIM INJECTION 6 MG/0.6ML ~~LOC~~
6.0000 mg | PREFILLED_SYRINGE | Freq: Once | SUBCUTANEOUS | Status: AC
  Administered 2015-07-31: 6 mg via SUBCUTANEOUS
  Filled 2015-07-31: qty 0.6

## 2015-07-31 NOTE — Progress Notes (Signed)
Neulasta injection given by flush nurse 

## 2015-07-31 NOTE — Patient Instructions (Signed)
Pegfilgrastim injection What is this medicine? PEGFILGRASTIM (PEG fil gra stim) is a long-acting granulocyte colony-stimulating factor that stimulates the growth of neutrophils, a type of white blood cell important in the body's fight against infection. It is used to reduce the incidence of fever and infection in patients with certain types of cancer who are receiving chemotherapy that affects the bone marrow, and to increase survival after being exposed to high doses of radiation. This medicine may be used for other purposes; ask your health care provider or pharmacist if you have questions. What should I tell my health care provider before I take this medicine? They need to know if you have any of these conditions: -kidney disease -latex allergy -ongoing radiation therapy -sickle cell disease -skin reactions to acrylic adhesives (On-Body Injector only) -an unusual or allergic reaction to pegfilgrastim, filgrastim, other medicines, foods, dyes, or preservatives -pregnant or trying to get pregnant -breast-feeding How should I use this medicine? This medicine is for injection under the skin. If you get this medicine at home, you will be taught how to prepare and give the pre-filled syringe or how to use the On-body Injector. Refer to the patient Instructions for Use for detailed instructions. Use exactly as directed. Take your medicine at regular intervals. Do not take your medicine more often than directed. It is important that you put your used needles and syringes in a special sharps container. Do not put them in a trash can. If you do not have a sharps container, call your pharmacist or healthcare provider to get one. Talk to your pediatrician regarding the use of this medicine in children. While this drug may be prescribed for selected conditions, precautions do apply. Overdosage: If you think you have taken too much of this medicine contact a poison control center or emergency room at  once. NOTE: This medicine is only for you. Do not share this medicine with others. What if I miss a dose? It is important not to miss your dose. Call your doctor or health care professional if you miss your dose. If you miss a dose due to an On-body Injector failure or leakage, a new dose should be administered as soon as possible using a single prefilled syringe for manual use. What may interact with this medicine? Interactions have not been studied. Give your health care provider a list of all the medicines, herbs, non-prescription drugs, or dietary supplements you use. Also tell them if you smoke, drink alcohol, or use illegal drugs. Some items may interact with your medicine. This list may not describe all possible interactions. Give your health care provider a list of all the medicines, herbs, non-prescription drugs, or dietary supplements you use. Also tell them if you smoke, drink alcohol, or use illegal drugs. Some items may interact with your medicine. What should I watch for while using this medicine? You may need blood work done while you are taking this medicine. If you are going to need a MRI, CT scan, or other procedure, tell your doctor that you are using this medicine (On-Body Injector only). What side effects may I notice from receiving this medicine? Side effects that you should report to your doctor or health care professional as soon as possible: -allergic reactions like skin rash, itching or hives, swelling of the face, lips, or tongue -dizziness -fever -pain, redness, or irritation at site where injected -pinpoint red spots on the skin -red or dark-brown urine -shortness of breath or breathing problems -stomach or side pain, or pain   at the shoulder -swelling -tiredness -trouble passing urine or change in the amount of urine Side effects that usually do not require medical attention (report to your doctor or health care professional if they continue or are  bothersome): -bone pain -muscle pain This list may not describe all possible side effects. Call your doctor for medical advice about side effects. You may report side effects to FDA at 1-800-FDA-1088. Where should I keep my medicine? Keep out of the reach of children. Store pre-filled syringes in a refrigerator between 2 and 8 degrees C (36 and 46 degrees F). Do not freeze. Keep in carton to protect from light. Throw away this medicine if it is left out of the refrigerator for more than 48 hours. Throw away any unused medicine after the expiration date. NOTE: This sheet is a summary. It may not cover all possible information. If you have questions about this medicine, talk to your doctor, pharmacist, or health care provider.    2016, Elsevier/Gold Standard. (2014-10-02 14:30:14)  

## 2015-08-01 ENCOUNTER — Ambulatory Visit: Payer: Medicaid Other

## 2015-08-03 NOTE — Addendum Note (Signed)
Addended by: Hulan Fray on: 08/03/2015 06:11 PM   Modules accepted: Orders

## 2015-08-06 ENCOUNTER — Telehealth: Payer: Self-pay

## 2015-08-06 ENCOUNTER — Other Ambulatory Visit: Payer: Self-pay | Admitting: Hematology

## 2015-08-06 DIAGNOSIS — C259 Malignant neoplasm of pancreas, unspecified: Secondary | ICD-10-CM

## 2015-08-06 MED ORDER — OXYCODONE HCL 5 MG PO TABS: 5.0000 mg | ORAL_TABLET | ORAL | Status: AC | PRN

## 2015-08-06 NOTE — Telephone Encounter (Signed)
Pt c/o pain in her back radiating to her shoulders, around to her chest and under her breasts, constant, 10/10, can't sleep.  Pt reports oxycodone helps but she is out.  Pt also reports she is not taking oxycontin although it was just prescribed on 11/2 - states she was told not to take it due to constipation.  Per office notes, pt taken off percocet.  Pt not due for oxycodone refill until 11/12.  Pt received chemo on 11/2 and neulasta injection on 11/4.  Let pt know I would notify MD and someone from Dr Ernestina Penna team would call her.  Routed to pod 1.

## 2015-08-06 NOTE — Telephone Encounter (Signed)
I called her back, she is taking oxycodone, and will run out tomorrow morning, but she is not taking oxycontin due to the concern of constipation. She states chemo did not make her sick last week, she is eating and drinking fluids adequately. I told her to  restart OxyContin, and increase MiraLAX 2-3 times a day.  She will come in tomorrow morning to pick up the oxycodone prescription.  Truitt Merle  08/06/2015

## 2015-08-07 ENCOUNTER — Encounter: Payer: Self-pay | Admitting: Hematology

## 2015-08-07 ENCOUNTER — Ambulatory Visit (HOSPITAL_BASED_OUTPATIENT_CLINIC_OR_DEPARTMENT_OTHER): Payer: Medicaid Other | Admitting: Hematology

## 2015-08-07 ENCOUNTER — Other Ambulatory Visit (HOSPITAL_BASED_OUTPATIENT_CLINIC_OR_DEPARTMENT_OTHER): Payer: Medicaid Other

## 2015-08-07 ENCOUNTER — Telehealth: Payer: Self-pay | Admitting: *Deleted

## 2015-08-07 VITALS — BP 120/85 | HR 106 | Temp 98.1°F | Resp 20 | Ht 60.0 in | Wt 98.6 lb

## 2015-08-07 DIAGNOSIS — C787 Secondary malignant neoplasm of liver and intrahepatic bile duct: Secondary | ICD-10-CM | POA: Diagnosis not present

## 2015-08-07 DIAGNOSIS — R634 Abnormal weight loss: Secondary | ICD-10-CM | POA: Diagnosis not present

## 2015-08-07 DIAGNOSIS — D630 Anemia in neoplastic disease: Secondary | ICD-10-CM | POA: Diagnosis not present

## 2015-08-07 DIAGNOSIS — D509 Iron deficiency anemia, unspecified: Secondary | ICD-10-CM

## 2015-08-07 DIAGNOSIS — Z720 Tobacco use: Secondary | ICD-10-CM

## 2015-08-07 DIAGNOSIS — C250 Malignant neoplasm of head of pancreas: Secondary | ICD-10-CM

## 2015-08-07 DIAGNOSIS — R569 Unspecified convulsions: Secondary | ICD-10-CM | POA: Diagnosis not present

## 2015-08-07 DIAGNOSIS — E876 Hypokalemia: Secondary | ICD-10-CM

## 2015-08-07 DIAGNOSIS — C259 Malignant neoplasm of pancreas, unspecified: Secondary | ICD-10-CM

## 2015-08-07 DIAGNOSIS — R630 Anorexia: Secondary | ICD-10-CM | POA: Diagnosis not present

## 2015-08-07 LAB — COMPREHENSIVE METABOLIC PANEL (CC13)
ALBUMIN: 3.8 g/dL (ref 3.5–5.0)
ALK PHOS: 222 U/L — AB (ref 40–150)
ALT: 23 U/L (ref 0–55)
AST: 26 U/L (ref 5–34)
Anion Gap: 13 mEq/L — ABNORMAL HIGH (ref 3–11)
BUN: 15.9 mg/dL (ref 7.0–26.0)
CHLORIDE: 97 meq/L — AB (ref 98–109)
CO2: 26 mEq/L (ref 22–29)
Calcium: 10.9 mg/dL — ABNORMAL HIGH (ref 8.4–10.4)
Creatinine: 0.8 mg/dL (ref 0.6–1.1)
GLUCOSE: 125 mg/dL (ref 70–140)
POTASSIUM: 3.2 meq/L — AB (ref 3.5–5.1)
SODIUM: 136 meq/L (ref 136–145)
Total Bilirubin: 0.43 mg/dL (ref 0.20–1.20)
Total Protein: 7.7 g/dL (ref 6.4–8.3)

## 2015-08-07 LAB — CBC WITH DIFFERENTIAL/PLATELET
BASO%: 0.2 % (ref 0.0–2.0)
BASOS ABS: 0 10*3/uL (ref 0.0–0.1)
EOS ABS: 0.1 10*3/uL (ref 0.0–0.5)
EOS%: 0.7 % (ref 0.0–7.0)
HCT: 34.3 % — ABNORMAL LOW (ref 34.8–46.6)
HGB: 11 g/dL — ABNORMAL LOW (ref 11.6–15.9)
LYMPH%: 16.8 % (ref 14.0–49.7)
MCH: 25.5 pg (ref 25.1–34.0)
MCHC: 32.1 g/dL (ref 31.5–36.0)
MCV: 79.3 fL — AB (ref 79.5–101.0)
MONO#: 1.7 10*3/uL — ABNORMAL HIGH (ref 0.1–0.9)
MONO%: 9.4 % (ref 0.0–14.0)
NEUT#: 13.2 10*3/uL — ABNORMAL HIGH (ref 1.5–6.5)
NEUT%: 72.9 % (ref 38.4–76.8)
Platelets: 373 10*3/uL (ref 145–400)
RBC: 4.32 10*6/uL (ref 3.70–5.45)
RDW: 22.2 % — ABNORMAL HIGH (ref 11.2–14.5)
WBC: 18.2 10*3/uL — AB (ref 3.9–10.3)
lymph#: 3.1 10*3/uL (ref 0.9–3.3)

## 2015-08-07 MED ORDER — OXYCODONE HCL ER 10 MG PO T12A: 10.0000 mg | EXTENDED_RELEASE_TABLET | Freq: Two times a day (BID) | ORAL | Status: AC

## 2015-08-07 MED ORDER — MORPHINE SULFATE ER 15 MG PO TBCR: 15.0000 mg | EXTENDED_RELEASE_TABLET | Freq: Two times a day (BID) | ORAL | Status: AC

## 2015-08-07 NOTE — Telephone Encounter (Signed)
TC from patient stating that her Medicaid will not pay for new prescription for Oxycontin 10 mg. Requesting alternative prescription.  Please call patient @ [phone]

## 2015-08-07 NOTE — Progress Notes (Signed)
Introduced myself as her FA.  Informed her that her insurance should pay for her treatment at 100% but offered the [facility] grant to help with other expenses during her treatment.  She would like to apply for the grant so she will bring her disability check stub on 08/12/15 to see if she qualifies.

## 2015-08-07 NOTE — Progress Notes (Signed)
[name]  [phone] [phone]) [phone]  Clinic follow up Note   Patient Care Team: [name], MD as PCP - General Tania Ade, RN as Registered Nurse (Medical Oncology) 08/07/2015   CHIEF COMPLAINTS:  Follow up pancreatic adenocarcinoma    Oncology History   Pancreatic adenocarcinoma [facility])   Staging form: Pancreas, AJCC 7th Edition     Clinical: Stage IV (T3, N0, M1) - Unsigned     Pathologic stage from 06/25/2015: Stage IV (T3, N0, M1) - Unsigned     Pathologic: No stage assigned - Unsigned       Pancreatic adenocarcinoma ([place])   05/16/2015 Imaging Abdominal MRI and MRCP showed a hypovascular mass in the pancreatic head measuring 3.6 cm. This obstructs the distal common bile duct causing severe diffuse biliary ductal dilatation.   05/19/2015 Procedure Patient underwent EGD/ERCP and stent placement. A mass in the medial duodenum was found, biopsy was obtained.    05/19/2015 Pathology Results Duodenum mass biopsy showed invasive adenocarcinoma.   05/29/2015 Initial Diagnosis Pancreatic adenocarcinoma ([place])   06/25/2015 Surgery diagnostic laparoscopy showed 6-8 subcapsular liver lesions, liver biopsy showed metastatic adenocarcinoma, Whipple surgery was canceled.   06/25/2015 Pathology Results Liver biopsy showed adenocarcinoma, the morphology was compatible with the initial duodenum biopsy.   07/29/2015 -  Chemotherapy FOLFIRINOX, every 2 weeks with neulasta on day 3     HISTORY OF PRESENTING ILLNESS:  Shelia Arnold 49 y.o. female is here because of recently diagnosed pancreatic/duodenal cancer. I initially saw in the hospital, she is here today for follow-up.  She presented with 3-4 week history of jaundice,dark colored urine, as well as pruritus, and was admitted to [facility]. SHe also reported right lower and flank pain. She denied nausea or vomiting. She denied any cardiac or respiratory complaints. He  appetite was decreased with some weight loss associated with this. On admission, her total bili was elevated at 27.6, as well as high Alk Phos at 575, AST/ALT at 169 and 222 respectively. Abdominal ultrasound on 8/19 showed a possible mass in the CBD. MRCP showed an obstructing, 3.6 cm hypovascular mass in the head of the pancreas obstructing the common bile duct. ERCP with brush biopsy, stent placement was performed on 8/23, with improvement in her liver functions. During the ERCP, a large exophytic mass in the medial duodenum was found, biopsy showed adenocarcinoma.  She developed moderate abdominal pain after the ERCP procedure, mainly in the upper left abdomen, with radiation to mid back. She takes ibuprofen as needed for the pain, which helps. No nausea or vomiting, her bowel movements is slightly constipated.She has lost about 10-15 pounds in the past few months.  CURRENT THERAPY: FOLFIRINOX every 2 weeks, started on 07/29/2015   INTERIM HISTORY Shelia Arnold returns for follow-up.  She tolerated the first cycle chemotherapy well last week. She had no significant nausea, diarrhea, mild fatigue, no other new complaints. Her main concern is her worsening abdominal pain. She is taking oxycodone 4-6 tablets a day. I did give her OxyContin prescription last time, but she does not recall she received or filled it. Her appetite is moderate to low, she does drink Ensure 2-3 cans a day. She lost about 6 pounds in the past 2-3 weeks.  MEDICAL HISTORY:  Past Medical History  Diagnosis Date  . Depression   . Hypertension   . Alcohol abuse   . Headache   . Myocardial infarction ([place]) 1990's?    per pt. many  years ago does not see a cardiologist  . 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 Stroke ([place]) 1990's    no residual effects  . Arthritis     "back & legs" (06/25/2015)  . Chronic back pain   . Seizure [facility])     SURGICAL HISTORY: Past Surgical History  Procedure Laterality Date  . Ercp N/A  05/19/2015    Procedure: ENDOSCOPIC RETROGRADE CHOLANGIOPANCREATOGRAPHY (ERCP);  Surgeon: Teena Irani, MD;  Location: [facility] ENDOSCOPY;  Service: Endoscopy;  Laterality: N/A;  . Diagnostic laparoscopy  06/25/2015  . Wedge liver biopsy  06/25/2015  . Exploratory laparotomy  06/25/2015  . Tubal ligation  1982  . Cardiac catheterization  1990's?  . Colposcopy vulva w/ biopsy    . Laparoscopy N/A 06/25/2015    Procedure: LAPAROSCOPY DIAGNOSTIC;  Surgeon: Stark Klein, MD;  Location: [place];  Service: General;  Laterality: N/A;  . Whipple procedure N/A 06/25/2015    Procedure: LIVER BIOPSY;  Surgeon: Stark Klein, MD;  Location: [place];  Service: General;  Laterality: N/A;  . Portacath placement Left 07/01/2015    Procedure: INSERTION PORT-A-CATH;  Surgeon: Stark Klein, MD;  Location: [place];  Service: General;  Laterality: Left;    SOCIAL HISTORY: Social History   Social History  . Marital Status: Single    Spouse Name: N/A  . Number of Children: No   . Years of Education: N/A   Occupational History  . On disability due to seizure    Social History Main Topics  . Smoking status: Current Every Day Smoker -- 0.50 packs/day for 15 years    Types: Cigarettes  . Smokeless tobacco: Never Used     Comment: trying to cut back a little each day  . Alcohol Use: 12.0 oz/week    20 Cans of beer per week     Comment: 2 beers a night  05/15/2015 " 4 beers a day"  . Drug Use: No  . Sexual Activity: Not on file   Other Topics Concern  . Not on file   Social History Narrative   Single, does not regularly exercise, and completed the 7th grade.      Mother- dec.- died during childbirth, birth trauma   Brother- epilepsy- dec- 16's   Father- unknown   No known history of cancer in 1st degree family members    FAMILY HISTORY: Family History  Problem Relation Age of Onset  . Cancer Father     unknown type    ALLERGIES:  has No Known Allergies.  MEDICATIONS:  Current Outpatient  Prescriptions  Medication Sig Dispense Refill  . amitriptyline (ELAVIL) 25 MG tablet TAKE ONE TABLET BY MOUTH AT BEDTIME 90 tablet 3  . diclofenac (FLECTOR) 1.3 % PTCH PLACE 1 PATCH ONTO THE SKIN TWICE DAILY 180 patch 3  . Emollient (GOLD BOND MEDICATED BODY) 5-0.15 % LOTN Apply 1 application topically daily as needed (dry skin).    . hydrochlorothiazide (HYDRODIURIL) 25 MG tablet Take 0.5 tablets (12.5 mg total) by mouth daily. (Patient taking differently: Take 25 mg by mouth daily. ) 90 tablet 3  . levETIRAcetam (KEPPRA) 1000 MG tablet TAKE 1 AND 1/2 TABLETS BY MOUTH EVERY 12 HOURS 270 tablet 0  . lidocaine-prilocaine (EMLA) cream Apply 1 application topically as needed. Apply to portacath 1 1/2 - 2 hours prior to procedures as needed. 30 g 3  . Multiple Vitamin (MULTIVITAMIN WITH MINERALS) TABS tablet Take 1 tablet by mouth daily.  90 tablet 9  . ondansetron (ZOFRAN) 8 MG tablet Take 1 tablet (8 mg total) by mouth 2 (two) times daily. Start the day after chemo for 3 days. Then take as needed for nausea or vomiting. 30 tablet 1  . oxyCODONE (OXY IR/ROXICODONE) 5 MG immediate release tablet Take 1 tablet (5 mg total) by mouth every 4 (four) hours as needed for severe pain. 120 tablet 0  . polyethylene glycol (MIRALAX / GLYCOLAX) packet Take 17 g by mouth 2 (two) times daily. 14 each 0  . Sennosides-Docusate Sodium (SENNA) 8.6-50 MG TABS Take 1 tablet by mouth 2 (two) times daily. 7 each 0  . ibuprofen (ADVIL,MOTRIN) 400 MG tablet Take 1 tablet (400 mg total) by mouth every 6 (six) hours as needed for moderate pain. (Patient not taking: Reported on 08/07/2015) 90 tablet 1  . loperamide (IMODIUM A-D) 2 MG tablet Take 2 at onset of diarrhea, then 1 every 2hrs until 12hr without a BM. May take 2 tab every 4hrs at bedtime. If diarrhea recurs repeat. (Patient not taking: Reported on 08/07/2015) 100 tablet 1  . morphine (MS CONTIN) 15 MG 12 hr tablet Take 1 tablet (15 mg total) by mouth every 12 (twelve)  hours. 60 tablet 0  . oxyCODONE (OXYCONTIN) 10 mg 12 hr tablet Take 1 tablet (10 mg total) by mouth every 12 (twelve) hours. 60 tablet 0  . oxyCODONE-acetaminophen (PERCOCET/ROXICET) 5-325 MG tablet Take 1 tablet by mouth every 6 (six) hours as needed for severe pain. (Patient not taking: Reported on 08/07/2015) 30 tablet 0  . potassium chloride SA (K-DUR,KLOR-CON) 20 MEQ tablet Take 1 tablet (20 mEq total) by mouth 2 (two) times daily. 60 tablet 0  . traMADol (ULTRAM) 50 MG tablet Take 1 tablet (50 mg total) by mouth every 6 (six) hours as needed (Please only give tramadol if ibuprofen does not alleviate her back pain). (Patient not taking: Reported on 08/07/2015) 30 tablet 0  . VOLTAREN 1 % GEL Apply 2 g topically 4 (four) times daily. 3 Tube 1   No current facility-administered medications for this visit.    REVIEW OF SYSTEMS:   Constitutional: Denies fevers, chills or abnormal night sweats, (+) anorexia and fatigue Eyes: Denies blurriness of vision, double vision or watery eyes Ears, nose, mouth, throat, and face: Denies mucositis or sore throat Respiratory: Denies cough, dyspnea or wheezes Cardiovascular: Denies palpitation, chest discomfort or lower extremity swelling Gastrointestinal:  Denies nausea, heartburn or change in bowel habits, (+) moderate to severe epigastric pain Skin: Denies abnormal skin rashes Lymphatics: Denies new lymphadenopathy or easy bruising Neurological:Denies numbness, tingling or new weaknesses Behavioral/Psych: Mood is stable, no new changes  All other systems were reviewed with the patient and are negative.  PHYSICAL EXAMINATION: ECOG PERFORMANCE STATUS: 1 - Symptomatic but completely ambulatory  Filed Vitals:   08/07/15 1118  BP: 120/85  Pulse: 106  Temp: 98.1 F (36.7 C)  Resp: 20   Filed Weights   08/07/15 1118  Weight: 98 lb 9.6 oz (44.725 kg)    GENERAL:alert, no distress and comfortable SKIN: skin color, texture, turgor are normal, no  rashes or significant lesions EYES: normal, conjunctiva are pink and non-injected, sclera clear OROPHARYNX:no exudate, no erythema and lips, buccal mucosa, and tongue normal  NECK: supple, thyroid normal size, non-tender, without nodularity LYMPH:  no palpable lymphadenopathy in the cervical, axillary or inguinal LUNGS: clear to auscultation and percussion with normal breathing effort HEART: regular rate & rhythm and no murmurs and no  lower extremity edema ABDOMEN:abdomen soft, mild tenderness in epigastric area, normal bowel sounds, (+) midline Incision has well healed.  Musculoskeletal:no cyanosis of digits and no clubbing  PSYCH: alert & oriented x 3 with fluent speech NEURO: no focal motor/sensory deficits  LABORATORY DATA:  I have reviewed the data as listed CBC Latest Ref Rng 08/07/2015 07/29/2015 07/15/2015  WBC 3.9 - 10.3 10e3/uL 18.2(H) 9.9 11.5(H)  Hemoglobin 11.6 - 15.9 g/dL 11.0(L) 10.6(L) 10.9(L)  Hematocrit 34.8 - 46.6 % 34.3(L) 32.4(L) 34.0(L)  Platelets 145 - 400 10e3/uL 373 277 469(H)    CMP Latest Ref Rng 08/07/2015 07/29/2015 07/15/2015  Glucose 70 - 140 mg/dl 125 95 94  BUN 7.0 - 26.0 mg/dL 15.9 16.6 16.6  Creatinine 0.6 - 1.1 mg/dL 0.8 0.7 0.7  Sodium 136 - 145 mEq/L 136 137 138  Potassium 3.5 - 5.1 mEq/L 3.2(L) 3.6 3.1(L)  Chloride 101 - 111 mmol/L - - -  CO2 22 - 29 mEq/L _0 Calcium 8.4 - 10.4 mg/dL 10.9(H) 10.9(H) 10.8(H)  Total Protein 6.4 - 8.3 g/dL 7.7 7.7 7.8  Total Bilirubin 0.20 - 1.20 mg/dL 0.43 0.54 0.88  Alkaline Phos 40 - 150 U/L 222(H) 135 149  AST 5 - 34 U/L 26 51(H) 28  ALT 0 - 55 U/L _1 PATHOLOGY REPORT Diagnosis 05/19/2015 Duodenum, Biopsy INVASIVE ADENOCARCINOMA, SEE COMMENT. Microscopic Comment The case was reviewed with Dr. Saralyn Pilar who concurs. The case was discussed with Dr. Amedeo Plenty on 05/20/2015. (CR:kh 05/20/2015)  Liver, biopsy 06/25/2015 - ADENOCARCINOMA. Microscopic Comment The morphologic features are compatible  with metastatic duodenal adenocarcinoma.  ERCP 05/19/2015 IMPRESSIONS: malignant-appearing mass invading the duodenal wall. 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  RADIOGRAPHIC STUDIES: I have personally reviewed the radiological images as listed and agreed with the findings in the report.  CT chest, abdomen and pelvis w contrast 07/23/2015  IMPRESSION: 1. Multiple hepatic metastasis are now easily identified on contrast CT. Increased conspicuity indicates a positive treatment effect versus progression of disease. 2. No significant change in pancreatic head mass size. No evidence of progression. 3. Interval placement of a common bile duct stent with relief of the biliary obstruction. 4. No evidence of peritoneal metastasis. 5. Several 1-2 mm pulmonary nodules (approximately 3) are not seen on prior is indeterminate finding. This could be related to slice selection and very small nodules. Recommend attention on follow-up.   ASSESSMENT & PLAN: 49 year old African-American female, with past medical history of seizure, hypertension, on disability, presented with jaundice and abdominal pain.  1. 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 -I reviewed her surgical liver biopsy results, unfortunately it confirmed liver metastasis. -I reviewed her restaging CT scan images with her, which showed multiple liver metastasis. No other new distant metastasis.  -This is likely pancreatic adenocarcinoma with direct invading duodenum, although duodenal adenocarcinoma invading pancreas is also possible, but felt to be less likely. -Her case was re-presented in our GI tumor board earlier this week, and consensus is this is likely a pancreatic primary. -Unfortunately this is  incurable disease. The goal of therapy is disease control and prolong life. We discussed the nature of metastatic pancreatic cancer and overall very poor prognosis. She voiced good understanding -She has started first line chemo FOLFIRINOX, she tolerated the first cycle well -I will  titrate her pain meds, and I encouraged her to take nutritional supplements.   2. Abdominal pain  -Likely related to her pancreatic cancer. -I renewed her oxycodone today -I give her a prescription of OxyContin 10 mg every 12 hours. She laid on call back that it is not covered by her insurance. I'll send a new prescription of MS Contin 15 mg every 12 hours to her pharmacy.  3 Anemia -her anemia work up showed significantly elevated ferritin, slightly low serum iron m and low transferrin saturation.  -She has iron deficient anemia, also component of anemia of malignancy. -I arranged IV feraheme X1 with next cycle chemo   4. Hypokalemia -potassium 3.2 today. Continue by mouth potassium.  5. Seizure -Continue antiseizure medication -I encouraged her to contact her neurologist for her seizure this morning.  6. Malnutrition and weight loss -I encouraged her to take nutrition supplement  -She'll continue follow-up with our dietitian   PLAN -refilled her oxycodone, and gave her MS contin 37m q12h  -She'll return next week for cycle 2 chemotherapy   All questions were answered. The patient knows to call the clinic with any problems, questions or concerns. I spent 20 minutes counseling the patient face to face. The total time spent in the appointment was 25 minutes and more than 50% was on counseling.     FTruitt Merle MD 08/07/2015 5:16 PM

## 2015-08-07 NOTE — Telephone Encounter (Signed)
Message to [employer]quel to get prior auth @ [phone] after speaking to pharmacist at [facility].

## 2015-08-08 LAB — CANCER ANTIGEN 19-9: CA 19-9: 7.2 U/mL (ref <35.0)

## 2015-08-09 ENCOUNTER — Emergency Department (HOSPITAL_COMMUNITY): Payer: Medicaid Other

## 2015-08-09 ENCOUNTER — Encounter (HOSPITAL_COMMUNITY): Payer: Self-pay | Admitting: *Deleted

## 2015-08-09 ENCOUNTER — Emergency Department (HOSPITAL_COMMUNITY)
Admission: EM | Admit: 2015-08-09 | Discharge: 2015-08-09 | Disposition: A | Discharge status: Home / Self Care | Payer: Medicaid Other | Attending: Emergency Medicine | Admitting: Emergency Medicine

## 2015-08-09 ENCOUNTER — Telehealth: Payer: Self-pay | Admitting: Hematology

## 2015-08-09 ENCOUNTER — Other Ambulatory Visit: Payer: Self-pay

## 2015-08-09 DIAGNOSIS — I252 Old myocardial infarction: Secondary | ICD-10-CM | POA: Insufficient documentation

## 2015-08-09 DIAGNOSIS — Z8673 Personal history of transient ischemic attack (TIA), and cerebral infarction without residual deficits: Secondary | ICD-10-CM | POA: Diagnosis not present

## 2015-08-09 DIAGNOSIS — R0602 Shortness of breath: Secondary | ICD-10-CM | POA: Insufficient documentation

## 2015-08-09 DIAGNOSIS — F17200 Nicotine dependence, unspecified, uncomplicated: Secondary | ICD-10-CM | POA: Insufficient documentation

## 2015-08-09 DIAGNOSIS — Z79891 Long term (current) use of opiate analgesic: Secondary | ICD-10-CM | POA: Diagnosis not present

## 2015-08-09 DIAGNOSIS — M1990 Unspecified osteoarthritis, unspecified site: Secondary | ICD-10-CM | POA: Insufficient documentation

## 2015-08-09 DIAGNOSIS — E876 Hypokalemia: Secondary | ICD-10-CM | POA: Insufficient documentation

## 2015-08-09 DIAGNOSIS — G8929 Other chronic pain: Secondary | ICD-10-CM | POA: Insufficient documentation

## 2015-08-09 DIAGNOSIS — Z85068 Personal history of other malignant neoplasm of small intestine: Secondary | ICD-10-CM | POA: Insufficient documentation

## 2015-08-09 DIAGNOSIS — F329 Major depressive disorder, single episode, unspecified: Secondary | ICD-10-CM | POA: Insufficient documentation

## 2015-08-09 DIAGNOSIS — Z79899 Other long term (current) drug therapy: Secondary | ICD-10-CM | POA: Diagnosis not present

## 2015-08-09 DIAGNOSIS — R000 Tachycardia, unspecified: Secondary | ICD-10-CM | POA: Insufficient documentation

## 2015-08-09 DIAGNOSIS — I10 Essential (primary) hypertension: Secondary | ICD-10-CM | POA: Insufficient documentation

## 2015-08-09 DIAGNOSIS — Z8507 Personal history of malignant neoplasm of pancreas: Secondary | ICD-10-CM | POA: Diagnosis not present

## 2015-08-09 DIAGNOSIS — G40909 Epilepsy, unspecified, not intractable, without status epilepticus: Secondary | ICD-10-CM | POA: Insufficient documentation

## 2015-08-09 DIAGNOSIS — M47819 Spondylosis without myelopathy or radiculopathy, site unspecified: Secondary | ICD-10-CM | POA: Insufficient documentation

## 2015-08-09 LAB — COMPREHENSIVE METABOLIC PANEL
ALT: 22 U/L (ref 14–54)
AST: 32 U/L (ref 15–41)
Albumin: 3.2 g/dL — ABNORMAL LOW (ref 3.5–5.0)
Alkaline Phosphatase: 136 U/L — ABNORMAL HIGH (ref 38–126)
Anion gap: 11 (ref 5–15)
BUN: 27 mg/dL — ABNORMAL HIGH (ref 6–20)
CHLORIDE: 96 mmol/L — AB (ref 101–111)
CO2: 25 mmol/L (ref 22–32)
CREATININE: 0.73 mg/dL (ref 0.44–1.00)
Calcium: 9.2 mg/dL (ref 8.9–10.3)
GFR calc non Af Amer: >60 mL/min (ref >60)
Glucose, Bld: 135 mg/dL — ABNORMAL HIGH (ref 65–99)
POTASSIUM: 2.8 mmol/L — AB (ref 3.5–5.1)
SODIUM: 132 mmol/L — AB (ref 135–145)
Total Bilirubin: 0.4 mg/dL (ref 0.3–1.2)
Total Protein: 6.3 g/dL — ABNORMAL LOW (ref 6.5–8.1)

## 2015-08-09 LAB — CBC WITH DIFFERENTIAL/PLATELET
BASOS PCT: 0 %
Basophils Absolute: 0 10*3/uL (ref 0.0–0.1)
EOS PCT: 0 %
Eosinophils Absolute: 0 10*3/uL (ref 0.0–0.7)
HEMATOCRIT: 23.8 % — AB (ref 36.0–46.0)
HEMOGLOBIN: 7.6 g/dL — AB (ref 12.0–15.0)
LYMPHS PCT: 16 %
Lymphs Abs: 2.6 10*3/uL (ref 0.7–4.0)
MCH: 25 pg — ABNORMAL LOW (ref 26.0–34.0)
MCHC: 31.9 g/dL (ref 30.0–36.0)
MCV: 78.3 fL (ref 78.0–100.0)
MONOS PCT: 7 %
Monocytes Absolute: 1.1 10*3/uL — ABNORMAL HIGH (ref 0.1–1.0)
NEUTROS PCT: 77 %
Neutro Abs: 12.3 10*3/uL — ABNORMAL HIGH (ref 1.7–7.7)
Platelets: 317 10*3/uL (ref 150–400)
RBC: 3.04 MIL/uL — AB (ref 3.87–5.11)
RDW: 19 % — AB (ref 11.5–15.5)
WBC: 16 10*3/uL — AB (ref 4.0–10.5)

## 2015-08-09 LAB — URINALYSIS, ROUTINE W REFLEX MICROSCOPIC
BILIRUBIN URINE: NEGATIVE
GLUCOSE, UA: NEGATIVE mg/dL
Hgb urine dipstick: NEGATIVE
Ketones, ur: NEGATIVE mg/dL
NITRITE: NEGATIVE
PH: 6.5 (ref 5.0–8.0)
Protein, ur: NEGATIVE mg/dL
SPECIFIC GRAVITY, URINE: 1.021 (ref 1.005–1.030)
Urobilinogen, UA: 0.2 mg/dL (ref 0.0–1.0)

## 2015-08-09 LAB — I-STAT TROPONIN, ED
TROPONIN I, POC: 0.01 ng/mL (ref 0.00–0.08)
TROPONIN I, POC: 0.01 ng/mL (ref 0.00–0.08)

## 2015-08-09 LAB — BRAIN NATRIURETIC PEPTIDE: B Natriuretic Peptide: 47.7 pg/mL (ref 0.0–100.0)

## 2015-08-09 LAB — URINE MICROSCOPIC-ADD ON

## 2015-08-09 LAB — POC OCCULT BLOOD, ED: FECAL OCCULT BLD: NEGATIVE

## 2015-08-09 MED ORDER — POTASSIUM CHLORIDE 10 MEQ/100ML IV SOLN
10.0000 meq | INTRAVENOUS | Status: AC
  Administered 2015-08-09 (×4): 10 meq via INTRAVENOUS
  Filled 2015-08-09 (×4): qty 100

## 2015-08-09 MED ORDER — POTASSIUM CHLORIDE CRYS ER 20 MEQ PO TBCR
60.0000 meq | EXTENDED_RELEASE_TABLET | Freq: Once | ORAL | Status: AC
  Administered 2015-08-09: 60 meq via ORAL
  Filled 2015-08-09: qty 3

## 2015-08-09 MED ORDER — HEPARIN SOD (PORK) LOCK FLUSH 100 UNIT/ML IV SOLN
500.0000 [IU] | Freq: Once | INTRAVENOUS | Status: AC
  Administered 2015-08-09: 500 [IU]
  Filled 2015-08-09: qty 5

## 2015-08-09 MED ORDER — SODIUM CHLORIDE 0.9 % IV BOLUS (SEPSIS)
1000.0000 mL | Freq: Once | INTRAVENOUS | Status: AC
Start: 2015-08-09 — End: 2015-08-09
  Administered 2015-08-09: 1000 mL via INTRAVENOUS

## 2015-08-09 MED ORDER — ENSURE ENLIVE PO LIQD
1.0000 | Freq: Once | ORAL | Status: AC
  Administered 2015-08-09: 237 mL via ORAL
  Filled 2015-08-09: qty 237

## 2015-08-09 MED ORDER — SODIUM CHLORIDE 0.9 % IV SOLN
Freq: Once | INTRAVENOUS | Status: AC
  Administered 2015-08-09: 13:00:00 via INTRAVENOUS

## 2015-08-09 MED ORDER — IOHEXOL 350 MG/ML SOLN
100.0000 mL | Freq: Once | INTRAVENOUS | Status: AC | PRN
  Administered 2015-08-09: 100 mL via INTRAVENOUS

## 2015-08-10 ENCOUNTER — Telehealth: Payer: Self-pay | Admitting: *Deleted

## 2015-08-10 ENCOUNTER — Encounter: Payer: Self-pay | Admitting: Hematology

## 2015-08-10 ENCOUNTER — Telehealth: Payer: Self-pay | Admitting: Hematology

## 2015-08-10 NOTE — Telephone Encounter (Signed)
per Dr Burr Medico to cancel all appts pt expired 2015/08/23

## 2015-08-10 NOTE — Telephone Encounter (Signed)
Per staff message and POF I have scheduled appts. Advised scheduler of appts. Treatment moved from 11/30 to 11/29 due to day being capped.  JMW

## 2015-08-10 NOTE — Progress Notes (Signed)
I faxed via covermymeds oxycontin request

## 2015-08-11 NOTE — Telephone Encounter (Signed)
Pt expired unexpectedly.

## 2015-08-12 ENCOUNTER — Ambulatory Visit: Payer: Medicaid Other

## 2015-08-12 ENCOUNTER — Ambulatory Visit: Payer: Medicaid Other | Admitting: Nurse Practitioner

## 2015-08-12 ENCOUNTER — Other Ambulatory Visit: Payer: Medicaid Other

## 2015-08-12 ENCOUNTER — Encounter: Payer: Medicaid Other | Admitting: Nutrition

## 2015-08-25 ENCOUNTER — Ambulatory Visit: Payer: Medicaid Other

## 2015-08-26 ENCOUNTER — Ambulatory Visit: Payer: Medicaid Other

## 2015-08-26 ENCOUNTER — Other Ambulatory Visit: Payer: Medicaid Other

## 2015-08-26 ENCOUNTER — Ambulatory Visit: Payer: Medicaid Other | Admitting: Hematology

## 2015-08-27 NOTE — ED Notes (Addendum)
Per PTAR- pt began having SOB this morning at 530am. Pt able to speak in full sentences. NAD noted. Pt states that she has had some dizziness and diaphoresis recently. Pt states that last chemo was last week for pancreatic cancer.

## 2015-08-27 NOTE — Discharge Instructions (Signed)
Hypokalemia Hypokalemia means that the amount of potassium in the blood is lower than normal.Potassium is a chemical, called an electrolyte, that helps regulate the amount of fluid in the body. It also stimulates muscle contraction and helps nerves function properly.Most of the body's potassium is inside of cells, and only a very small amount is in the blood. Because the amount in the blood is so small, minor changes can be life-threatening. CAUSES  Antibiotics.  Diarrhea or vomiting.  Using laxatives too much, which can cause diarrhea.  Chronic kidney disease.  Water pills (diuretics).  Eating disorders (bulimia).  Low magnesium level.  Sweating a lot. SIGNS AND SYMPTOMS  Weakness.  Constipation.  Fatigue.  Muscle cramps.  Mental confusion.  Skipped heartbeats or irregular heartbeat (palpitations).  Tingling or numbness. DIAGNOSIS  Your health care provider can diagnose hypokalemia with blood tests. In addition to checking your potassium level, your health care provider may also check other lab tests. TREATMENT Hypokalemia can be treated with potassium supplements taken by mouth or adjustments in your current medicines. If your potassium level is very low, you may need to get potassium through a vein (IV) and be monitored in the hospital. A diet high in potassium is also helpful. Foods high in potassium are:  Nuts, such as peanuts and pistachios.  Seeds, such as sunflower seeds and pumpkin seeds.  Peas, lentils, and lima beans.  Whole grain and bran cereals and breads.  Fresh fruit and vegetables, such as apricots, avocado, bananas, cantaloupe, kiwi, oranges, tomatoes, asparagus, and potatoes.  Orange and tomato juices.  Red meats.  Fruit yogurt. HOME CARE INSTRUCTIONS  Take all medicines as prescribed by your health care provider.  Maintain a healthy diet by including nutritious food, such as fruits, vegetables, nuts, whole grains, and lean meats.  If  you are taking a laxative, be sure to follow the directions on the label. SEEK MEDICAL CARE IF:  Your weakness gets worse.  You feel your heart pounding or racing.  You are vomiting or having diarrhea.  You are diabetic and having trouble keeping your blood glucose in the normal range. SEEK IMMEDIATE MEDICAL CARE IF:  You have chest pain, shortness of breath, or dizziness.  You are vomiting or having diarrhea for more than 2 days.  You faint. MAKE SURE YOU:   Understand these instructions.  Will watch your condition.  Will get help right away if you are not doing well or get worse.   This information is not intended to replace advice given to you by your health care provider. Make sure you discuss any questions you have with your health care provider.   Document Released: 09/12/2005 Document Revised: 10/03/2014 Document Reviewed: 03/15/2013 Elsevier Interactive Patient Education 2016 [place] [employer] Shortness of breath means you have trouble breathing. It could also mean that you have a medical problem. You should get immediate medical care for shortness of breath. CAUSES   Not enough oxygen in the air such as with high altitudes or a smoke-filled room.  Certain lung diseases, infections, or problems.  Heart disease or conditions, such as angina or heart failure.  Low red blood cells (anemia).  Poor physical fitness, which can cause shortness of breath when you exercise.  Chest or back injuries or stiffness.  Being overweight.  Smoking.  Anxiety, which can make you feel like you are not getting enough air. DIAGNOSIS  Serious medical problems can often be found during your physical exam. Tests may also  be done to determine why you are having shortness of breath. Tests may include:  Chest X-rays.  Lung function tests.  Blood tests.  An electrocardiogram (ECG).  An ambulatory electrocardiogram. An ambulatory ECG records your heartbeat  patterns over a 24-hour period.  Exercise testing.  A transthoracic echocardiogram (TTE). During echocardiography, sound waves are used to evaluate how blood flows through your heart.  A transesophageal echocardiogram (TEE).  Imaging scans. Your health care provider may not be able to find a cause for your shortness of breath after your exam. In this case, it is important to have a follow-up exam with your health care provider as directed.  TREATMENT  Treatment for shortness of breath depends on the cause of your symptoms and can vary greatly. HOME CARE INSTRUCTIONS   Do not smoke. Smoking is a common cause of shortness of breath. If you smoke, ask for help to quit.  Avoid being around chemicals or things that may bother your breathing, such as paint fumes and dust.  Rest as needed. Slowly resume your usual activities.  If medicines were prescribed, take them as directed for the full length of time directed. This includes oxygen and any inhaled medicines.  Keep all follow-up appointments as directed by your health care provider. SEEK MEDICAL CARE IF:   Your condition does not improve in the time expected.  You have a hard time doing your normal activities even with rest.  You have any new symptoms. SEEK IMMEDIATE MEDICAL CARE IF:   Your shortness of breath gets worse.  You feel light-headed, faint, or develop a cough not controlled with medicines.  You start coughing up blood.  You have pain with breathing.  You have chest pain or pain in your arms, shoulders, or abdomen.  You have a fever.  You are unable to walk up stairs or exercise the way you normally do. MAKE SURE YOU:  Understand these instructions.  Will watch your condition.  Will get help right away if you are not doing well or get worse.   This information is not intended to replace advice given to you by your health care provider. Make sure you discuss any questions you have with your health care  provider.   Document Released: 06/07/2001 Document Revised: 09/17/2013 Document Reviewed: 11/28/2011 Elsevier Interactive Patient Education 2016 [place].  Potassium Content of Foods Potassium is a mineral found in many foods and drinks. It helps keep fluids and minerals balanced in your body and affects how steadily your heart beats. Potassium also helps control your blood pressure and keep your muscles and nervous system healthy. Certain health conditions and medicines may change the balance of potassium in your body. When this happens, you can help balance your level of potassium through the foods that you do or do not eat. Your health care provider or dietitian may recommend an amount of potassium that you should have each day. The following lists of foods provide the amount of potassium (in parentheses) per serving in each item. HIGH IN POTASSIUM  The following foods and beverages have 200 mg or more of potassium per serving:  Apricots, 2 raw or 5 dry (200 mg).  Artichoke, 1 medium (345 mg).  Avocado, raw,  each (245 mg).  Banana, 1 medium (425 mg).  Beans, lima, or baked beans, canned,  cup (280 mg).  Beans, white, canned,  cup (595 mg).  Beef roast, 3 oz (320 mg).  Beef, ground, 3 oz (270 mg).  Beets, raw or  cooked,  cup (260 mg).  Bran muffin, 2 oz (300 mg).  Broccoli,  cup (230 mg).  Brussels sprouts,  cup (250 mg).  Cantaloupe,  cup (215 mg).  Cereal, 100% bran,  cup (200-400 mg).  Cheeseburger, single, fast food, 1 each (225-400 mg).  Chicken, 3 oz (220 mg).  Clams, canned, 3 oz (535 mg).  Crab, 3 oz (225 mg).  Dates, 5 each (270 mg).  Dried beans and peas,  cup (300-475 mg).  Figs, dried, 2 each (260 mg).  Fish: halibut, tuna, cod, snapper, 3 oz (480 mg).  Fish: salmon, haddock, swordfish, perch, 3 oz (300 mg).  Fish, tuna, canned 3 oz (200 mg).  [place] fries, fast food, 3 oz (470 mg).  Granola with fruit and nuts,  cup (200  mg).  Grapefruit juice,  cup (200 mg).  Greens, beet,  cup (655 mg).  Honeydew melon,  cup (200 mg).  Kale, raw, 1 cup (300 mg).  Kiwi, 1 medium (240 mg).  Kohlrabi, rutabaga, parsnips,  cup (280 mg).  Lentils,  cup (365 mg).  Mango, 1 each (325 mg).  Milk, chocolate, 1 cup (420 mg).  Milk: nonfat, low-fat, whole, buttermilk, 1 cup (350-380 mg).  Molasses, 1 Tbsp (295 mg).  Mushrooms,  cup (280) mg.  Nectarine, 1 each (275 mg).  Nuts: almonds, peanuts, hazelnuts, [place], cashew, mixed, 1 oz (200 mg).  Nuts, pistachios, 1 oz (295 mg).  Orange, 1 each (240 mg).  Orange juice,  cup (235 mg).  Papaya, medium,  fruit (390 mg).  Peanut butter, chunky, 2 Tbsp (240 mg).  Peanut butter, smooth, 2 Tbsp (210 mg).  Pear, 1 medium (200 mg).  Pomegranate, 1 whole (400 mg).  Pomegranate juice,  cup (215 mg).  Pork, 3 oz (350 mg).  Potato chips, salted, 1 oz (465 mg).  Potato, baked with skin, 1 medium (925 mg).  Potatoes, boiled,  cup (255 mg).  Potatoes, mashed,  cup (330 mg).  Prune juice,  cup (370 mg).  Prunes, 5 each (305 mg).  Pudding, chocolate,  cup (230 mg).  Pumpkin, canned,  cup (250 mg).  Raisins, seedless,  cup (270 mg).  Seeds, sunflower or pumpkin, 1 oz (240 mg).  Soy milk, 1 cup (300 mg).  Spinach,  cup (420 mg).  Spinach, canned,  cup (370 mg).  Sweet potato, baked with skin, 1 medium (450 mg).  Swiss chard,  cup (480 mg).  Tomato or vegetable juice,  cup (275 mg).  Tomato sauce or puree,  cup (400-550 mg).  Tomato, raw, 1 medium (290 mg).  Tomatoes, canned,  cup (200-300 mg).  [place], 3 oz (250 mg).  Wheat germ, 1 oz (250 mg).  Winter squash,  cup (250 mg).  Yogurt, plain or fruited, 6 oz (260-435 mg).  Zucchini,  cup (220 mg). MODERATE IN POTASSIUM The following foods and beverages have 50-200 mg of potassium per serving:  Apple, 1 each (150 mg).  Apple juice,  cup (150  mg).  Applesauce,  cup (90 mg).  Apricot nectar,  cup (140 mg).  Asparagus, small spears,  cup or 6 spears (155 mg).  Bagel, cinnamon raisin, 1 each (130 mg).  Bagel, egg or plain, 4 in., 1 each (70 mg).  Beans, green,  cup (90 mg).  Beans, yellow,  cup (190 mg).  Beer, regular, 12 oz (100 mg).  Beets, canned,  cup (125 mg).  Blackberries,  cup (115 mg).  Blueberries,  cup (60 mg).  Bread, whole wheat,  1 slice (70 mg).  Broccoli, raw,  cup (145 mg).  Cabbage,  cup (150 mg).  Carrots, cooked or raw,  cup (180 mg).  Cauliflower, raw,  cup (150 mg).  Celery, raw,  cup (155 mg).  Cereal, bran flakes, cup (120-150 mg).  Cheese, cottage,  cup (110 mg).  Cherries, 10 each (150 mg).  Chocolate, 1 oz bar (165 mg).  Coffee, brewed 6 oz (90 mg).  Corn,  cup or 1 ear (195 mg).  Cucumbers,  cup (80 mg).  Egg, large, 1 each (60 mg).  Eggplant,  cup (60 mg).  Endive, raw, cup (80 mg).  English muffin, 1 each (65 mg).  Fish, orange roughy, 3 oz (150 mg).  Frankfurter, beef or pork, 1 each (75 mg).  Fruit cocktail,  cup (115 mg).  Grape juice,  cup (170 mg).  Grapefruit,  fruit (175 mg).  Grapes,  cup (155 mg).  Greens: kale, turnip, collard,  cup (110-150 mg).  Ice cream or frozen yogurt, chocolate,  cup (175 mg).  Ice cream or frozen yogurt, vanilla,  cup (120-150 mg).  Lemons, limes, 1 each (80 mg).  Lettuce, all types, 1 cup (100 mg).  Mixed vegetables,  cup (150 mg).  Mushrooms, raw,  cup (110 mg).  Nuts: walnuts, pecans, or macadamia, 1 oz (125 mg).  Oatmeal,  cup (80 mg).  Okra,  cup (110 mg).  Onions, raw,  cup (120 mg).  Peach, 1 each (185 mg).  Peaches, canned,  cup (120 mg).  Pears, canned,  cup (120 mg).  Peas, green, frozen,  cup (90 mg).  Peppers, green,  cup (130 mg).  Peppers, red,  cup (160 mg).  Pineapple juice,  cup (165 mg).  Pineapple, fresh or canned,  cup (100  mg).  Plums, 1 each (105 mg).  Pudding, vanilla,  cup (150 mg).  Raspberries,  cup (90 mg).  Rhubarb,  cup (115 mg).  Rice, wild,  cup (80 mg).  Shrimp, 3 oz (155 mg).  Spinach, raw, 1 cup (170 mg).  Strawberries,  cup (125 mg).  Summer squash  cup (175-200 mg).  Swiss chard, raw, 1 cup (135 mg).  Tangerines, 1 each (140 mg).  Tea, brewed, 6 oz (65 mg).  Turnips,  cup (140 mg).  Watermelon,  cup (85 mg).  Wine, red, table, 5 oz (180 mg).  Wine, white, table, 5 oz (100 mg).

## 2015-08-27 NOTE — ED Provider Notes (Addendum)
CSN: [id number]     Arrival date & time 04-Sep-2015  [id number] History   First MD Initiated Contact with Patient Sep 04, 2015 0914     Chief Complaint  Patient presents with  . Shortness of Breath     (Consider location/radiation/quality/duration/timing/severity/associated sxs/prior Treatment) HPI Comments: 49 year old female with past medical history including pancreatic cancer on chemotherapy, hypertension, seizures, alcohol abuse who presents with shortness of breath. The patient states that she woke up from sleep at 5:30 AM with a sudden onset of shortness of breath. She has never had this before. She denies any associated chest pain but does endorse lightheadedness. She has had some sweatiness this morning. She denies any vomiting, diarrhea, or abdominal pain. She has had a mild cough and runny nose but no fevers. She has been eating and drinking normally.  Patient is a 49 y.o. female presenting with shortness of breath. The history is provided by the patient.  Shortness of Breath   Past Medical History  Diagnosis Date  . Depression   . Hypertension   . Alcohol abuse   . Headache   . Myocardial infarction [facility]) 1990's?    per pt. many years ago does not see a cardiologist  . 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 Stroke ([place]) 1990's    no residual effects  . Arthritis     "back & legs" (06/25/2015)  . Chronic back pain   . Seizure [facility])    Past Surgical History  Procedure Laterality Date  . Ercp N/A 05/19/2015    Procedure: ENDOSCOPIC RETROGRADE CHOLANGIOPANCREATOGRAPHY (ERCP);  Surgeon: Teena Irani, MD;  Location: [facility] ENDOSCOPY;  Service: Endoscopy;  Laterality: N/A;  . Diagnostic laparoscopy  06/25/2015  . Wedge liver biopsy  06/25/2015  . Exploratory laparotomy  06/25/2015  . Tubal ligation  1982  . Cardiac catheterization  1990's?  . Colposcopy vulva w/ biopsy    . Laparoscopy N/A 06/25/2015    Procedure: LAPAROSCOPY DIAGNOSTIC;  Surgeon: Stark Klein, MD;  Location: [place];   Service: General;  Laterality: N/A;  . Whipple procedure N/A 06/25/2015    Procedure: LIVER BIOPSY;  Surgeon: Stark Klein, MD;  Location: [place];  Service: General;  Laterality: N/A;  . Portacath placement Left 07/01/2015    Procedure: INSERTION PORT-A-CATH;  Surgeon: Stark Klein, MD;  Location: [place];  Service: General;  Laterality: Left;   Family History  Problem Relation Age of Onset  . Cancer Father     unknown type   Social History  Substance Use Topics  . Smoking status: Current Every Day Smoker -- 0.25 packs/day for 33 years    Types: Cigarettes  . Smokeless tobacco: Never Used  . Alcohol Use: 12.0 oz/week    20 Cans of beer per week   OB History    No data available     Review of Systems  Respiratory: Positive for shortness of breath.    10 Systems reviewed and are negative for acute change except as noted in the HPI.    Allergies  Review of patient's allergies indicates no known allergies.  Home Medications   Prior to Admission medications   Medication Sig Start Date End Date Taking? Authorizing Provider  amitriptyline (ELAVIL) 25 MG tablet TAKE ONE TABLET BY MOUTH AT BEDTIME 11/28/14  Yes Luan Moore, MD  diclofenac (FLECTOR) 1.3 % PTCH PLACE 1 PATCH ONTO THE SKIN TWICE DAILY 06/23/15  Yes [name], MD  Emollient (GOLD BOND MEDICATED BODY)  5-0.15 % LOTN Apply 1 application topically daily as needed (dry skin).   Yes Historical Provider, MD  hydrochlorothiazide (HYDRODIURIL) 25 MG tablet Take 0.5 tablets (12.5 mg total) by mouth daily. Patient taking differently: Take 25 mg by mouth daily.  05/15/15  Yes Juluis Mire, MD  ibuprofen (ADVIL,MOTRIN) 400 MG tablet Take 1 tablet (400 mg total) by mouth every 6 (six) hours as needed for moderate pain. 05/26/15  Yes [name], MD  levETIRAcetam (KEPPRA) 1000 MG tablet TAKE 1 AND 1/2 TABLETS BY MOUTH EVERY 12 HOURS 05/28/15  Yes [name], MD  lidocaine-prilocaine (EMLA) cream  Apply 1 application topically as needed. Apply to portacath 1 1/2 - 2 hours prior to procedures as needed. 07/29/15  Yes Truitt Merle, MD  loperamide (IMODIUM A-D) 2 MG tablet Take 2 at onset of diarrhea, then 1 every 2hrs until 12hr without a BM. May take 2 tab every 4hrs at bedtime. If diarrhea recurs repeat. 07/29/15  Yes Truitt Merle, MD  Multiple Vitamin (MULTIVITAMIN WITH MINERALS) TABS tablet Take 1 tablet by mouth daily. 05/21/15  Yes Loleta Chance, MD  ondansetron (ZOFRAN) 8 MG tablet Take 1 tablet (8 mg total) by mouth 2 (two) times daily. Start the day after chemo for 3 days. Then take as needed for nausea or vomiting. 07/29/15  Yes Truitt Merle, MD  oxyCODONE (OXY IR/ROXICODONE) 5 MG immediate release tablet Take 1 tablet (5 mg total) by mouth every 4 (four) hours as needed for severe pain. 08/06/15  Yes Truitt Merle, MD  polyethylene glycol (MIRALAX / GLYCOLAX) packet Take 17 g by mouth 2 (two) times daily. 06/03/15  Yes Ejiroghene Arlyce Dice, MD  Sennosides-Docusate Sodium (SENNA) 8.6-50 MG TABS Take 1 tablet by mouth 2 (two) times daily. 06/03/15  Yes Ejiroghene E Emokpae, MD  VOLTAREN 1 % GEL Apply 2 g topically 4 (four) times daily. 07/17/15  Yes Bartholomew Crews, MD  morphine (MS CONTIN) 15 MG 12 hr tablet Take 1 tablet (15 mg total) by mouth every 12 (twelve) hours. 08/07/15   Truitt Merle, MD  oxyCODONE (OXYCONTIN) 10 mg 12 hr tablet Take 1 tablet (10 mg total) by mouth every 12 (twelve) hours. 08/07/15   Truitt Merle, MD  oxyCODONE-acetaminophen (PERCOCET/ROXICET) 5-325 MG tablet Take 1 tablet by mouth every 6 (six) hours as needed for severe pain. Patient not taking: Reported on 08/07/2015 07/21/15   Aldine Contes, MD  potassium chloride SA (K-DUR,KLOR-CON) 20 MEQ tablet Take 1 tablet (20 mEq total) by mouth 2 (two) times daily. 05/29/15   Truitt Merle, MD  traMADol (ULTRAM) 50 MG tablet Take 1 tablet (50 mg total) by mouth every 6 (six) hours as needed (Please only give tramadol if ibuprofen does not alleviate her  back pain). Patient not taking: Reported on 08/07/2015 05/22/15   Loleta Chance, MD   BP 109/79 mmHg  Pulse 95  Temp(Src) 98.7 F (37.1 C) (Oral)  Resp 15  SpO2 100%  LMP 12/26/2014 Physical Exam  Constitutional: She is oriented to person, place, and time. No distress.  Thin, frail-appearing woman  HENT:  Head: Normocephalic and atraumatic.  Moist mucous membranes  Eyes: Pupils are equal, round, and reactive to light.  Pale conjunctivae  Neck: Neck supple.  Cardiovascular: Regular rhythm and normal heart sounds.   No murmur heard. Tachycardic  Pulmonary/Chest: Effort normal and breath sounds normal. No respiratory distress. She has no wheezes.  Port in left upper chest  Abdominal: Soft. Bowel sounds are normal. She exhibits  no distension. There is no tenderness.  Musculoskeletal: She exhibits no edema.  Neurological: She is alert and oriented to person, place, and time.  Fluent speech  Skin: Skin is warm and dry. No rash noted. There is pallor.  Psychiatric: She has a normal mood and affect. Judgment normal.  Nursing note and vitals reviewed.   ED Course  Procedures (including critical care time) Labs Review Labs Reviewed  COMPREHENSIVE METABOLIC PANEL - Abnormal; Notable for the following:    Sodium 132 (*)    Potassium 2.8 (*)    Chloride 96 (*)    Glucose, Bld 135 (*)    BUN 27 (*)    Total Protein 6.3 (*)    Albumin 3.2 (*)    Alkaline Phosphatase 136 (*)    All other components within normal limits  CBC WITH DIFFERENTIAL/PLATELET - Abnormal; Notable for the following:    WBC 16.0 (*)    RBC 3.04 (*)    Hemoglobin 7.6 (*)    HCT 23.8 (*)    MCH 25.0 (*)    RDW 19.0 (*)    Neutro Abs 12.3 (*)    Monocytes Absolute 1.1 (*)    All other components within normal limits  URINALYSIS, ROUTINE W REFLEX MICROSCOPIC (NOT AT [facility]) - Abnormal; Notable for the following:    APPearance CLOUDY (*)    Leukocytes, UA SMALL (*)    All other components within normal limits   URINE MICROSCOPIC-ADD ON - Abnormal; Notable for the following:    Squamous Epithelial / LPF FEW (*)    Bacteria, UA FEW (*)    All other components within normal limits  BRAIN NATRIURETIC PEPTIDE  OCCULT BLOOD X 1 CARD TO LAB, STOOL  I-STAT TROPOININ, ED  POC OCCULT BLOOD, ED  Randolm Idol, ED    Imaging Review Dg Chest 2 View  08-31-2015  CLINICAL DATA:  Short of breath since this morning. Headache and dizziness. Some cough. EXAM: CHEST  2 VIEW COMPARISON:  07/01/2015 FINDINGS: Normal heart, mediastinum hila. Clear lungs. No pleural effusion or pneumothorax. Left anterior chest wall Port-A-Cath is stable with its tip in the lower superior vena cava. Skeletal structures are unremarkable. IMPRESSION: No active cardiopulmonary disease. Electronically Signed   By: Lajean Manes M.D.   On: Aug 31, 2015 10:12   Ct Angio Chest Pe W/cm &/or Wo Cm  2015/08/31  CLINICAL DATA:  Shortness of breath and dizziness this morning, bilateral lower chest pain. Additional clinical data from a previous CT report describes stage IV pancreatic cancer with liver metastasis EXAM: CT ANGIOGRAPHY CHEST WITH CONTRAST TECHNIQUE: Multidetector CT imaging of the chest was performed using the standard protocol during bolus administration of intravenous contrast. Multiplanar CT image reconstructions and MIPs were obtained to evaluate the vascular anatomy. CONTRAST:  181mL OMNIPAQUE IOHEXOL 350 MG/ML SOLN COMPARISON:  Chest x-ray from earlier same day and CTA exam of 07/23/2015. FINDINGS: There is no pulmonary embolism identified within the main, lobar, or segmental pulmonary arteries bilaterally. Mild atherosclerotic changes noted along the walls of the normal-caliber thoracic aortic arch. No aortic aneurysm or dissection. Heart size is normal. No pericardial effusion. No mass or enlarged lymph nodes identified within the mediastinum or perihilar regions. The tiny 2 mm right upper lobe pulmonary nodule described on the  earlier CT is not convincingly seen on today's study. The 2 mm nodule along the right minor fissure is again seen and unchanged in the short-term interval. The 2 mm nodule described previously within the left lower  lobe is also not convincingly seen on today's study. There is minimal dependent atelectasis at each lung base. Mild emphysematous change again noted within the upper lobes bilaterally. No new lung findings. No evidence of pneumonia. No pleural effusion. No pneumothorax. Patient's biliary stent is incompletely imaged at the lower aspects of this exam, with associated pneumobilia. Limited images of the upper abdomen are otherwise unremarkable. Patient's previously described pancreatic head mass is below the lower limits of this exam. No acute or suspicious osseous abnormality identified. Review of the MIP images confirms the above findings. IMPRESSION: 1. No acute findings. No pulmonary embolism. No aortic aneurysm or dissection. Heart size is normal. No pericardial effusion. No pneumonia or pleural effusion. No acute or suspicious osseous lesion. 2. Mild emphysematous changes in the upper lobes. 3. Tiny pulmonary nodules within the right upper lobe and left lower lobe described on the previous chest CT are not convincingly seen on today's exam, possibly related to slice selection differences. Electronically Signed   By: Franki Cabot M.D.   On: 08-25-2015 13:23   I have personally reviewed and evaluated these lab results as part of my medical decision-making.   EKG Interpretation   Date/Time:  Aug 25, 2015 09:00:16 EST Ventricular Rate:  114 PR Interval:  121 QRS Duration: 65 QT Interval:  333 QTC Calculation: 459 R Axis:   95 Text Interpretation:  Sinus tachycardia Right atrial enlargement  Borderline right axis deviation Abnormal lateral Q waves Nonspecific T  abnormalities, inferior leads T wave inversions in inferior leads new from  previous Reconfirmed by Levina Boyack MD, Walden Statz  [phone]) on 08-25-2015 9:43:04 AM     Medications  potassium chloride 10 mEq in 100 mL IVPB (10 mEq Intravenous New Bag/Given 2015-08-25 1346)  feeding supplement (ENSURE ENLIVE) (ENSURE ENLIVE) liquid 237 mL (not administered)  sodium chloride 0.9 % bolus 1,000 mL (0 mLs Intravenous Stopped 08/25/2015 1306)  potassium chloride SA (K-DUR,KLOR-CON) CR tablet 60 mEq (60 mEq Oral Given 25-Aug-2015 1212)  0.9 %  sodium chloride infusion ( Intravenous New Bag/Given 2015-08-25 1307)  iohexol (OMNIPAQUE) 350 MG/ML injection 100 mL (100 mLs Intravenous Contrast Given 08-25-2015 1252)    MDM   Final diagnoses:  Shortness of breath  Hypokalemia   49 year old female with history of pancreatic cancer on chemotherapy who presents with shortness of breath that began suddenly this morning. On arrival, the patient was awake, alert, and in no acute distress. Vital signs notable for tachycardia at 115, BP 93/79. No respiratory distress on exam. Obtained above labs as well as EKG. EKG shows tachycardia with T-wave inversions in inferior leads, new from previous EKG. Gave the patient an IV fluid bolus. I'm concerned about PE given the patient's underlying cancer. Obtained a CTA of the chest.  Labs showed K 2.8, WBC 16 which is slightly lower than recent labs, Hgb 7.6 w/ negative hemoccult, likely due to chemotherapy. Negative serial troponin and BNP. CTA showed no PE, aortic pathology, or infiltrate. Gave pt IV and PO potassium. On reexamination after IVF bolus, pt breathing comfortably w/ normal VS. O2 sat 100% and tachycardia resolved. Discussed results and emphasized need for f/u w/ physician in 2-3 days for re-evaluation and recheck of K+. Pt voiced understanding.  Sharlett Iles, MD 08/12/15 [place], MD 08/12/15 [phone]

## 2015-08-27 NOTE — Telephone Encounter (Signed)
Iron added to 11/16 per pof and sent an email to [facility] for ok for 11/30  As it has been capped   anne

## 2015-08-27 NOTE — ED Notes (Signed)
MD at bedside. 

## 2015-08-27 DEATH — deceased

## 2015-08-31 ENCOUNTER — Ambulatory Visit: Payer: Medicaid Other | Admitting: Internal Medicine

## 2016-02-18 IMAGING — CR DG CHEST 1V PORT
1 series · 1 of 1 positions shown · non-contrast
Comparison: 02/23/2010, 02/22/2010, prior chest CT 05/20/2015

CLINICAL DATA: 49-year-old female with a history of port catheter
insertion.

EXAM:
PORTABLE CHEST 1 VIEW

[AP]
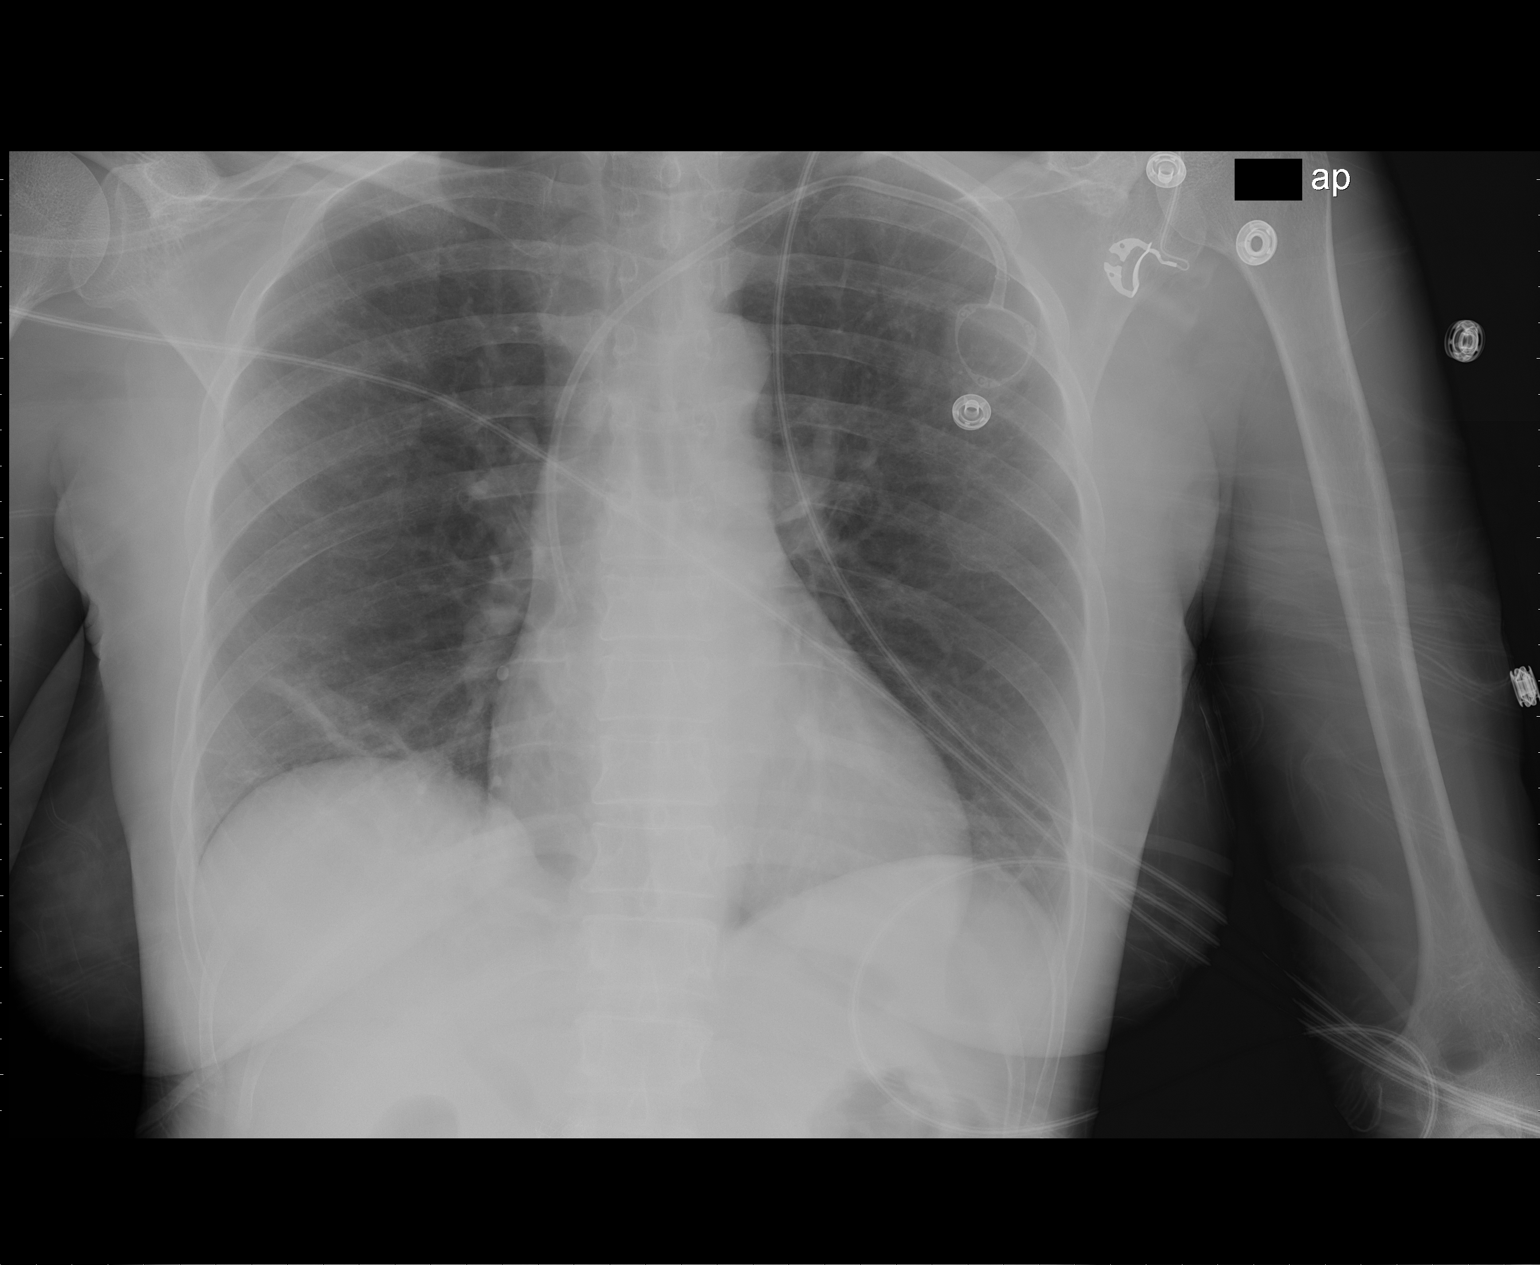

[1 of 1 positions shown; findings below may reference images not displayed]

FINDINGS: Cardiomediastinal silhouette unchanged in size and contour.

Interval placement of left chest wall port catheter with the
catheter via a subclavian approach. Catheter appears to terminate in
the superior vena cava.

No visualized pneumothorax.

No confluent airspace disease.

Linear opacity at the right base, likely atelectasis. No pleural
effusion.

No displaced fracture.

Biliary stent of the upper abdomen incompletely imaged.
IMPRESSION: Interval placement of left-sided chest wall single-lumen port
catheter via subclavian approach with the tip of the catheter
appearing to terminate in the superior vena cava.

Right lung base atelectasis, with no evidence of pneumothorax.

## 2016-04-28 IMAGING — US US ABDOMEN COMPLETE
1 series · 13 of 25 positions shown · non-contrast
Comparison: None.

CLINICAL DATA: Jaundice.

EXAM:
ULTRASOUND ABDOMEN COMPLETE

[Series 1: us abdomen complete · 0.18mm/px · 13 of 69 slices shown]
[im 1/69]
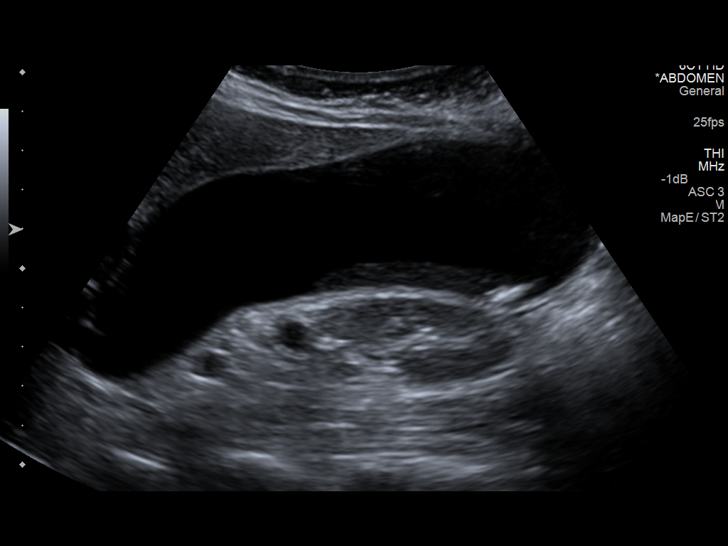
[im 6/69]
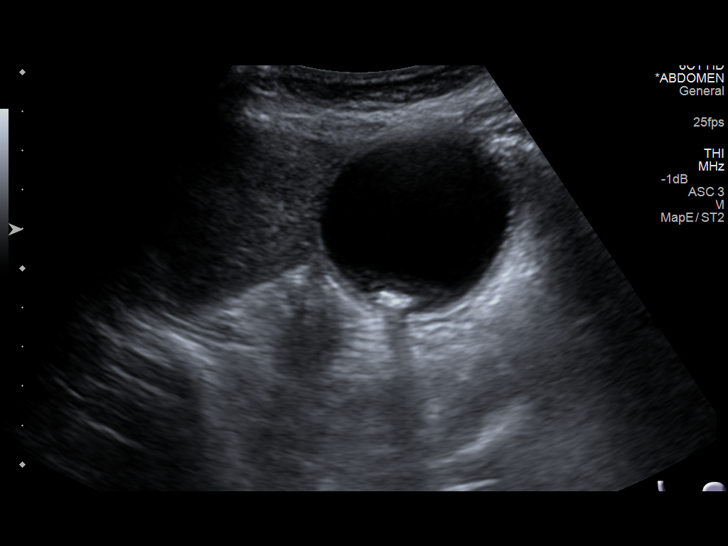
[im 12/69]
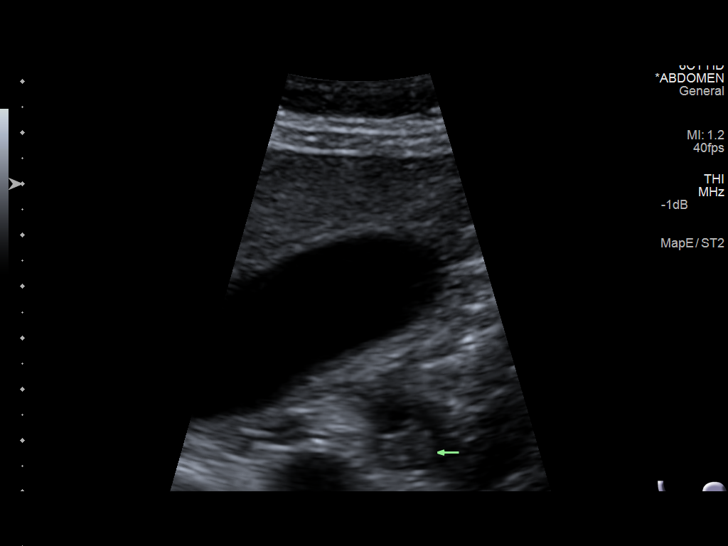
[im 18/69]
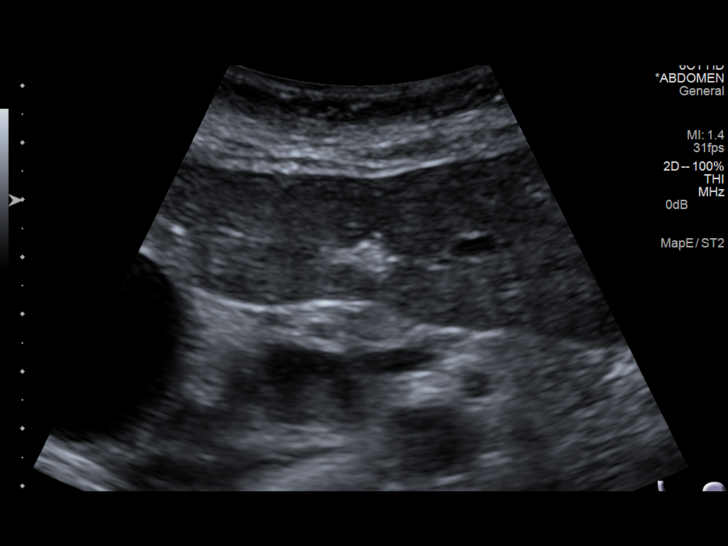
[im 23/69]
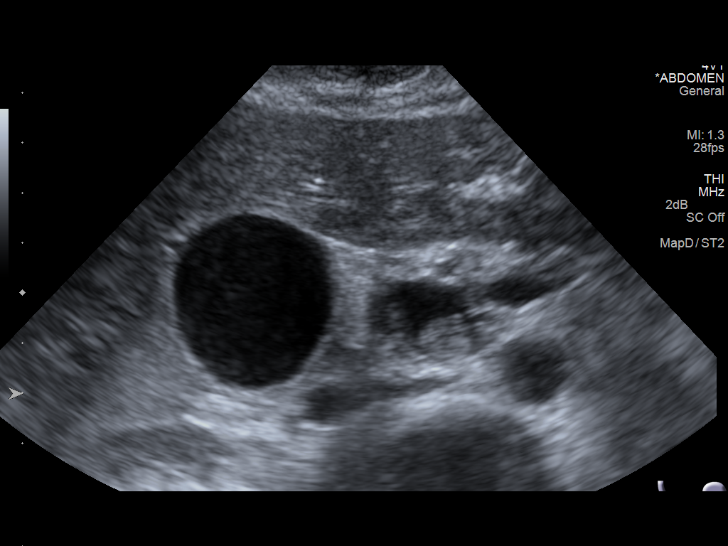
[im 29/69]
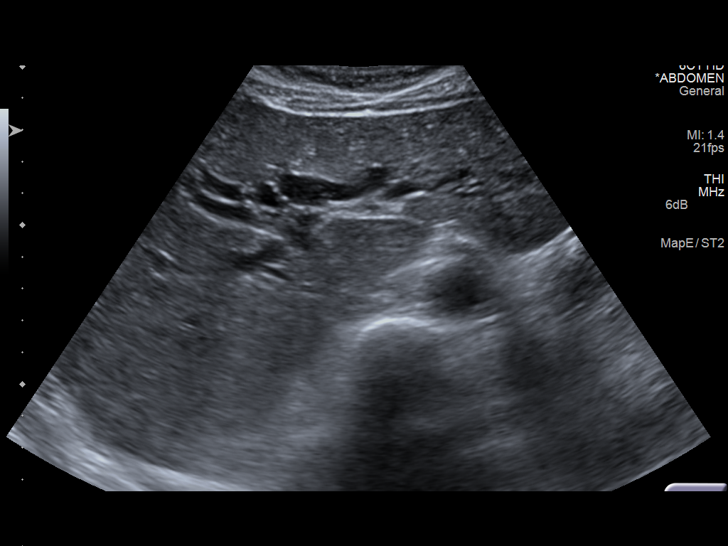
[im 35/69]
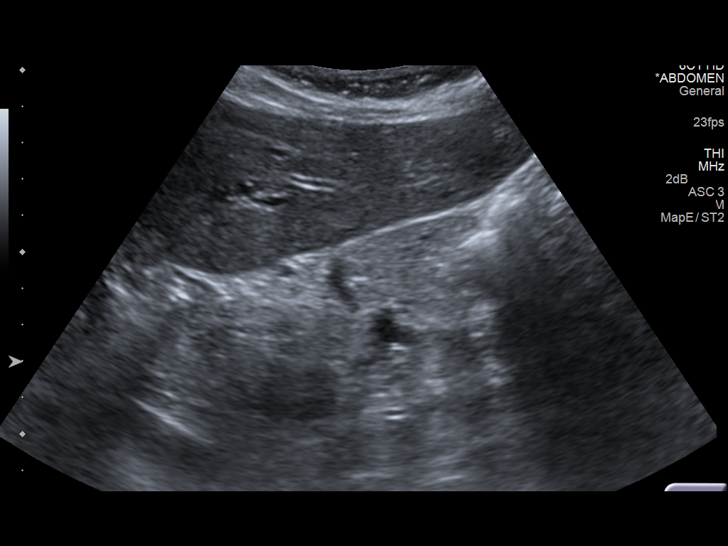
[im 40/69]
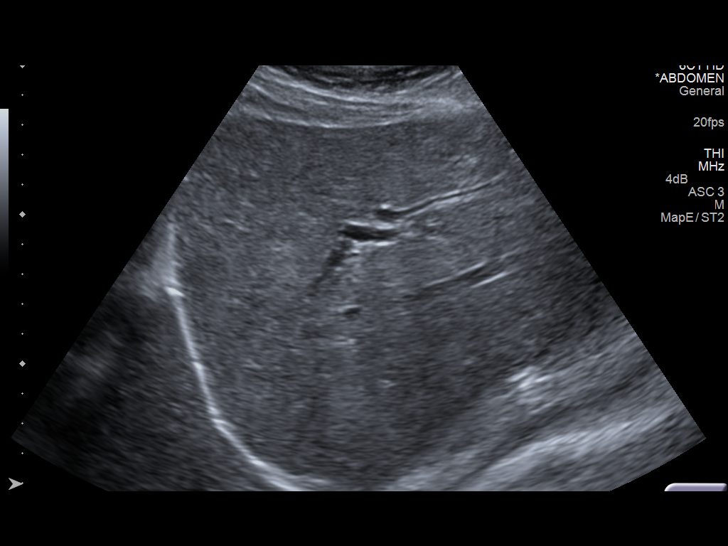
[im 46/69]
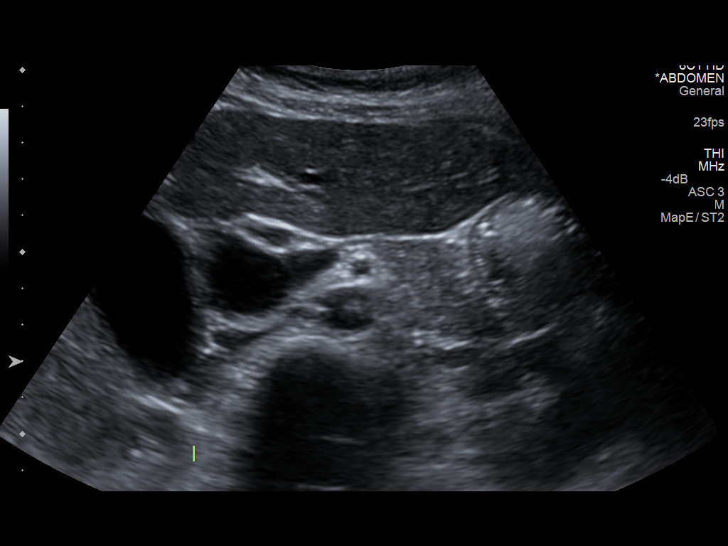
[im 52/69]
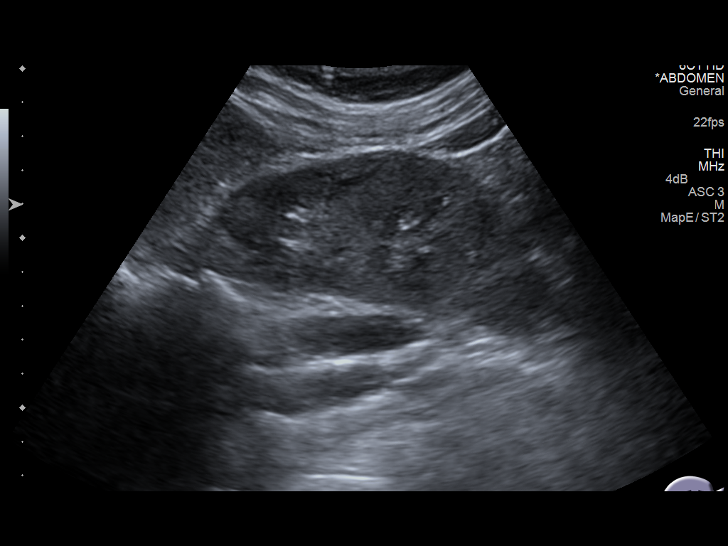
[im 57/69]
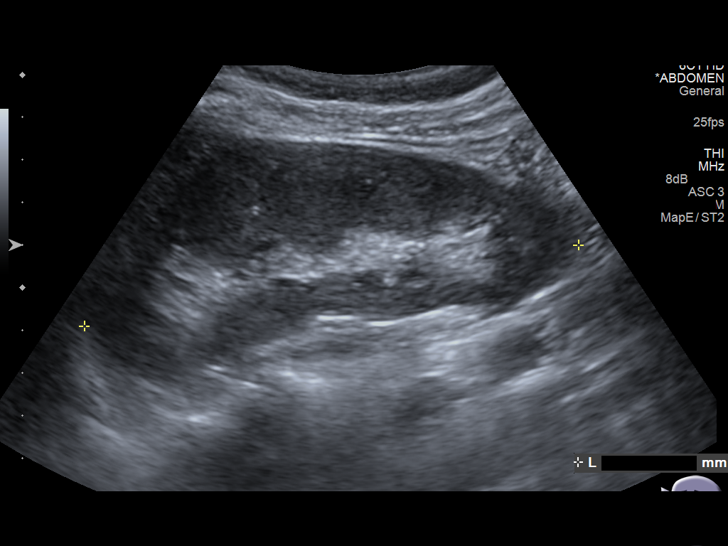
[im 63/69]
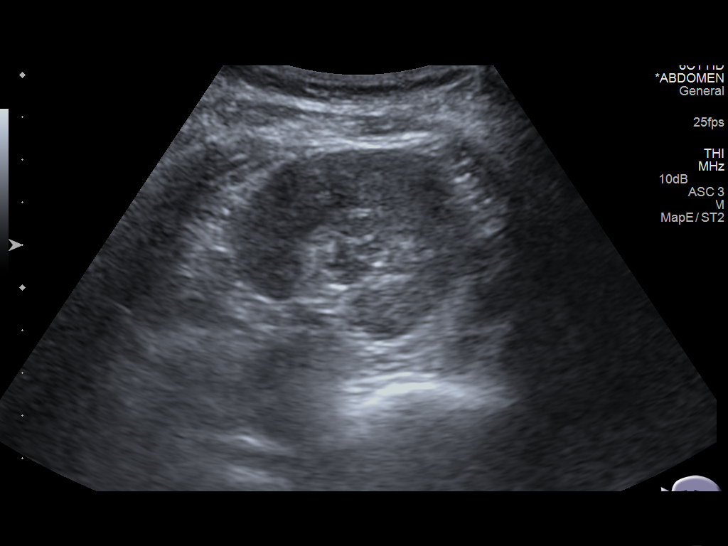
[im 69/69]
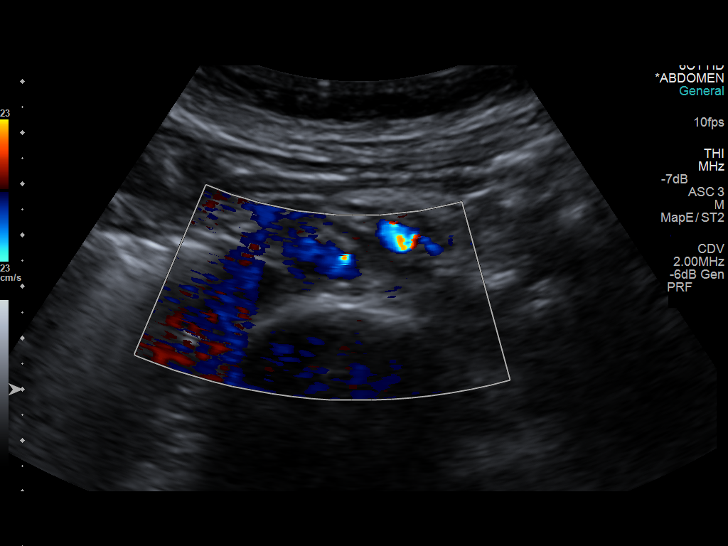

[13 of 25 positions shown; findings below may reference images not displayed]

FINDINGS: Gallbladder: Sludge and gallstones noted. Gallbladder is distended.
Gallbladder wall thickness 1.3 mm. Negative Murphy sign.

Common bile duct: Diameter: 16 mm. A 3.1 x 1.6 x 1.2 cm soft tissue
mass is noted. No shadowing noted. This could represent tumefactive
sludge within the common bile duct or a tumor mass.

Liver: No focal lesion identified. Within normal limits in
parenchymal echogenicity. Portal vein is patent.

IVC: No abnormality visualized.

Pancreas: Visualized portion unremarkable. Portions obscured by
bowel gas.

Spleen: Size and appearance within normal limits.

Right Kidney: Length: 11.8 cm. Echogenicity within normal limits. No
mass or hydronephrosis visualized.

Left Kidney: Length: 11.7 cm. Echogenicity within normal limits. No
mass or hydronephrosis visualized.

Abdominal aorta: No aneurysm visualized.

Other findings: None.
IMPRESSION: 1. Sludge and gallstones noted.  The gallbladder is distended.
2. Common bile duct is distended to 16 mm. There is a a soft tissue
mass within the distal common bile duct measuring 3.1 x 1.6 x
cm. This is consistent with obstructing tumefactive sludge or a
tumor. MRCP should be considered for further evaluation. Associated
intrahepatic biliary ductal distention noted.
These results will be called to the ordering clinician or
representative by the Radiologist Assistant, and communication
documented in the PACS or zVision Dashboard.
